# Patient Record
Sex: Male | Born: 1955 | Race: White | Hispanic: No | Marital: Married | Smoking: Current some day smoker
Health system: Southern US, Community
[De-identification: ages and names within clinical notes are randomized; demographics above are authoritative.]

## PROBLEM LIST (undated history)

## (undated) DIAGNOSIS — C61 Malignant neoplasm of prostate: Secondary | ICD-10-CM

## (undated) DIAGNOSIS — I509 Heart failure, unspecified: Secondary | ICD-10-CM

## (undated) DIAGNOSIS — D689 Coagulation defect, unspecified: Secondary | ICD-10-CM

## (undated) DIAGNOSIS — R519 Headache, unspecified: Secondary | ICD-10-CM

## (undated) DIAGNOSIS — I2510 Atherosclerotic heart disease of native coronary artery without angina pectoris: Secondary | ICD-10-CM

## (undated) DIAGNOSIS — R51 Headache: Secondary | ICD-10-CM

## (undated) HISTORY — DX: Atherosclerotic heart disease of native coronary artery without angina pectoris: I25.10

## (undated) HISTORY — DX: Heart failure, unspecified: I50.9

## (undated) HISTORY — PX: CERVICAL DISCECTOMY: SHX98

## (undated) HISTORY — PX: TONSILLECTOMY: SUR1361

## (undated) HISTORY — PX: PROSTATE BIOPSY: SHX241

## (undated) HISTORY — DX: Coagulation defect, unspecified: D68.9

---

## 1974-08-14 HISTORY — PX: CYST EXCISION: SHX5701

## 2006-12-19 LAB — HM COLONOSCOPY

## 2009-02-16 ENCOUNTER — Ambulatory Visit (HOSPITAL_COMMUNITY): Admission: RE | Admit: 2009-02-16 | Discharge: 2009-02-16 | Payer: Self-pay | Admitting: Family Medicine

## 2009-03-24 ENCOUNTER — Ambulatory Visit (HOSPITAL_COMMUNITY): Admission: RE | Admit: 2009-03-24 | Discharge: 2009-03-25 | Payer: Self-pay | Admitting: Neurosurgery

## 2009-05-11 ENCOUNTER — Encounter: Admission: RE | Admit: 2009-05-11 | Discharge: 2009-05-11 | Payer: Self-pay | Admitting: Neurosurgery

## 2010-11-19 LAB — DIFFERENTIAL
Basophils Relative: 0 % (ref 0–1)
Eosinophils Absolute: 0.3 10*3/uL (ref 0.0–0.7)
Eosinophils Relative: 2 % (ref 0–5)
Neutrophils Relative %: 66 % (ref 43–77)

## 2010-11-19 LAB — TYPE AND SCREEN
ABO/RH(D): AB POS
Antibody Screen: NEGATIVE

## 2010-11-19 LAB — CBC
HCT: 41 % (ref 39.0–52.0)
MCHC: 34.3 g/dL (ref 30.0–36.0)
MCV: 95.3 fL (ref 78.0–100.0)
Platelets: 298 10*3/uL (ref 150–400)
RDW: 13.5 % (ref 11.5–15.5)

## 2010-12-27 NOTE — Op Note (Signed)
[name], Cowan              ACCOUNT NO.:  [id number]   MEDICAL RECORD NO.:  [id number]          PATIENT TYPE:  INP   LOCATION:  3524                         FACILITY:  [facility]   PHYSICIAN:  Kathaleen Maser. Pool, M.D.    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   POSTOPERATIVE DIAGNOSIS:  C4-5, C5-6, and C6-7 spondylosis with stenosis  and radiculopathy.   PROCEDURE NOTE:  C4-5, C5-6, and C6-7 anterior cervical diskectomy and  fusion with allograft and plating.   SURGEON:  Kathaleen Maser. Pool, MD   ASSISTANT:  Donalee Citrin, MD   ANESTHESIA:  General endotracheal.   INDICATIONS:  Andre Cowan is a 55 year old male with history of neck pain  with right upper extremity pain, paresthesia, and weakness consistent  with a mixed cervical radiculopathy.  Workup demonstrates evidence of  marked spondylosis and multilevel stenosis worse at C4-5, C5-6, and C6-7  levels.  The patient has been counseled as to his options.  He decided  to proceed with 3-level anterior cervical decompression and fusion with  instrumentation.   OPERATIVE NOTE:  The patient was brought to the operating room and  placed in the supine position.  After adequate level of anesthesia was  achieved, the patient was positioned with the neck slightly extended and  held in place with halter traction.  The patient's anterior cervical  region was prepped and draped sterilely.  A 10 blade was used to make  curvilinear skin incision overlying the C5-6 level.  This was carried  down sharply to the platysma.  The platysma was then divided vertically  and dissection proceeded along the medial border of the  sternocleidomastoid muscle and carotid sheath.  Trachea and esophagus  were mobilized and tracked towards the left.  Prevertebral fascia was  stripped off  the anterior spinal column.  Longus colli muscle was then  elevated bilaterally using electrocautery.  Deep self-retaining  retractor was placed.  Intraoperative fluoroscopy was used and the C4-5,  C5-6, and T6-7 levels were confirmed.  The anterior osteophytes were  removed using high-speed drill and Leksell rongeurs at 3 levels.  Disk  space was then incised with 15 blade in a rectangular fashion.  Wide  space clean-out was achieved using pituitary rongeurs, forward and  backward Karlin curettes, Kerrison rongeurs, and high-speed drill.  All  elements of disk were removed down the level of the posterior annulus.  Microscope was brought into the field and used throughout the remainder  of the diskectomy starting first at C4-5.  The remaining aspects of  annulus and osteophytes were removed using high-speed drill down the  level of the posterior longitudinal ligament.  The posterior  longitudinal ligament was then elevated and resected in the piecemeal  fashion using Kerrison rongeurs.  The underlying thecal sac was then  identified.  Wide central decompression was then performed by  undercutting the bodies of C4 and C5.  Decompression was then proceeded  out to the edge of neural foramen.  Wide anterior foraminotomies were  then performed along the course of the exiting C5 nerve roots  bilaterally.  At this point, a very thorough decompression was achieved  at C4-5.  C5-6 and C6-7 were decompressed in a similar fashion again  without complication.  Wound was then irrigated with antibiotic  solution.  Gelfoam was placed topically for hemostasis and found to be  good.  Cornerstone allograft wedges were then packed into place at C4-5,  C5-6, and C6-7.  All 3 grafts were recessed approximately 1 mm at the  anterior cortical margin.  Atlantis translational plate was then placed  over the C4-C7 levels.  This was then attached under fluoroscopic  guidance using 30-mm fixed angle screws, 2 each  at all 4 levels.  Final  images revealed good position of bone grafts, hardware at proper  operative level with normal alignment of spine.  Wound was then  irrigated with antibiotic solution.  Gelfoam was placed topically for  hemostasis which was found to be good.  Wound was then closed in typical  fashion.  Steri-Strips and sterile dressing were applied.  There were no  complications.  The patient tolerated the procedure well and he returned  to the recovery room postoperatively.           ______________________________  Kathaleen Maser Pool, M.D.     HAP/MEDQ  D:  03/24/2009  T:  03/25/2009  Job:  [id number]

## 2017-11-19 ENCOUNTER — Other Ambulatory Visit: Payer: Self-pay | Admitting: Family Medicine

## 2017-11-19 DIAGNOSIS — N139 Obstructive and reflux uropathy, unspecified: Secondary | ICD-10-CM

## 2017-12-28 ENCOUNTER — Ambulatory Visit
Admission: RE | Admit: 2017-12-28 | Discharge: 2017-12-28 | Disposition: A | Discharge status: Home / Self Care | Payer: BLUE CROSS/BLUE SHIELD | Source: Ambulatory Visit | Attending: Family Medicine | Admitting: Family Medicine

## 2017-12-28 DIAGNOSIS — N139 Obstructive and reflux uropathy, unspecified: Secondary | ICD-10-CM

## 2018-01-11 ENCOUNTER — Encounter (HOSPITAL_COMMUNITY): Payer: Self-pay

## 2018-01-11 ENCOUNTER — Emergency Department (HOSPITAL_COMMUNITY)
Admission: EM | Admit: 2018-01-11 | Discharge: 2018-01-12 | Disposition: A | Discharge status: Home / Self Care | Payer: BLUE CROSS/BLUE SHIELD | Attending: Emergency Medicine | Admitting: Emergency Medicine

## 2018-01-11 ENCOUNTER — Other Ambulatory Visit: Payer: Self-pay

## 2018-01-11 DIAGNOSIS — R339 Retention of urine, unspecified: Secondary | ICD-10-CM | POA: Diagnosis not present

## 2018-01-11 LAB — URINALYSIS, ROUTINE W REFLEX MICROSCOPIC
BACTERIA UA: NONE SEEN
Bilirubin Urine: NEGATIVE
GLUCOSE, UA: NEGATIVE mg/dL
Ketones, ur: NEGATIVE mg/dL
Leukocytes, UA: NEGATIVE
Nitrite: NEGATIVE
Protein, ur: NEGATIVE mg/dL
Specific Gravity, Urine: 1.011 (ref 1.005–1.030)
pH: 6 (ref 5.0–8.0)

## 2018-01-11 LAB — I-STAT CHEM 8, ED
BUN: 11 mg/dL (ref 6–20)
CHLORIDE: 102 mmol/L (ref 101–111)
Calcium, Ion: 1.2 mmol/L (ref 1.15–1.40)
Creatinine, Ser: 1.2 mg/dL (ref 0.61–1.24)
GLUCOSE: 96 mg/dL (ref 65–99)
HEMATOCRIT: 44 % (ref 39.0–52.0)
Hemoglobin: 15 g/dL (ref 13.0–17.0)
POTASSIUM: 3.6 mmol/L (ref 3.5–5.1)
SODIUM: 138 mmol/L (ref 135–145)
TCO2: 24 mmol/L (ref 22–32)

## 2018-01-11 MED ORDER — LIDOCAINE HCL URETHRAL/MUCOSAL 2 % EX GEL
1.0000 "application " | Freq: Once | CUTANEOUS | Status: AC | PRN
  Administered 2018-01-11: 1 via URETHRAL
  Filled 2018-01-11 (×2): qty 5

## 2018-01-11 NOTE — ED Notes (Signed)
Urine culture sent down with UA. 

## 2018-01-11 NOTE — ED Provider Notes (Signed)
[facility] DEPT Provider Note   CSN: [id number] Arrival date & time: 01/11/18  2016     History   Chief Complaint Chief Complaint  Patient presents with  . Urinary Retention    HPI Andre Cowan is a 62 y.o. male.  The history is provided by the patient.  Illness  This is a new problem. The current episode started 6 to 12 hours ago. The problem occurs constantly. The problem has not changed since onset.Pertinent negatives include no chest pain, no abdominal pain, no headaches and no shortness of breath. Nothing aggravates the symptoms. Nothing relieves the symptoms. He has tried nothing for the symptoms. The treatment provided no relief.  Patient with difficulty urinating for 6 months presents with acute retention x 10 hours post biopsy.  Told to come to the ED by Dr. Alroy Dust of urology.    History reviewed. No pertinent past medical history.  There are no active problems to display for this patient.    Home Medications    Prior to Admission medications   Not on File    Family History History reviewed. No pertinent family history.  Social History Social History   Tobacco Use  . Smoking status: Not on file  Substance Use Topics  . Alcohol use: Not on file  . Drug use: Not on file     Allergies   Patient has no known allergies.   Review of Systems Review of Systems  Constitutional: Negative for fever.  Eyes: Negative for photophobia.  Respiratory: Negative for shortness of breath.   Cardiovascular: Negative for chest pain, palpitations and leg swelling.  Gastrointestinal: Negative for abdominal pain.  Genitourinary: Positive for difficulty urinating. Negative for flank pain.  Musculoskeletal: Negative for arthralgias.  Neurological: Negative for headaches.  Hematological: Negative for adenopathy.  All other systems reviewed and are negative.    Physical Exam Updated Vital Signs BP 120/88 (BP Location: Left Arm)    Pulse (!) 112   Temp 98.1 F (36.7 C) (Oral)   Resp 18   Ht 5\' 11"  (1.803 m)   Wt 89.4 kg (197 lb 1.6 oz)   SpO2 100%   BMI 27.49 kg/m   Physical Exam  Constitutional: He is oriented to person, place, and time. He appears well-developed and well-nourished. No distress.  HENT:  Head: Normocephalic and atraumatic.  Mouth/Throat: No oropharyngeal exudate.  Eyes: Pupils are equal, round, and reactive to light. Conjunctivae are normal.  Neck: Normal range of motion. Neck supple.  Cardiovascular: Normal rate, regular rhythm, normal heart sounds and intact distal pulses.  Pulmonary/Chest: Effort normal and breath sounds normal. No stridor. He has no wheezes. He has no rales.  Abdominal: Soft. Bowel sounds are normal. He exhibits no mass. There is no tenderness. There is no rebound and no guarding. No hernia.  Musculoskeletal: Normal range of motion.  Neurological: He is alert and oriented to person, place, and time.  Skin: Skin is warm and dry. Capillary refill takes less than 2 seconds.  Psychiatric: He has a normal mood and affect.     ED Treatments / Results  Labs (all labs ordered are listed, but only abnormal results are displayed) Results for orders placed or performed during the hospital encounter of 01/11/18  Urinalysis, Routine w reflex microscopic- may I&O cath if menses  Result Value Ref Range   Color, Urine YELLOW YELLOW   APPearance CLEAR CLEAR   Specific Gravity, Urine 1.011 1.005 - 1.030   pH 6.0 5.0 -  8.0   Glucose, UA NEGATIVE NEGATIVE mg/dL   Hgb urine dipstick LARGE (A) NEGATIVE   Bilirubin Urine NEGATIVE NEGATIVE   Ketones, ur NEGATIVE NEGATIVE mg/dL   Protein, ur NEGATIVE NEGATIVE mg/dL   Nitrite NEGATIVE NEGATIVE   Leukocytes, UA NEGATIVE NEGATIVE   RBC / HPF >50 (H) 0 - 5 RBC/hpf   WBC, UA 0-5 0 - 5 WBC/hpf   Bacteria, UA NONE SEEN NONE SEEN   Mucus PRESENT   I-stat chem 8, ed  Result Value Ref Range   Sodium 138 135 - 145 mmol/L   Potassium 3.6 3.5  - 5.1 mmol/L   Chloride 102 101 - 111 mmol/L   BUN 11 6 - 20 mg/dL   Creatinine, Ser 1.20 0.61 - 1.24 mg/dL   Glucose, Bld 96 65 - 99 mg/dL   Calcium, Ion 1.20 1.15 - 1.40 mmol/L   TCO2 24 22 - 32 mmol/L   Hemoglobin 15.0 13.0 - 17.0 g/dL   HCT 44.0 39.0 - 52.0 %   US Renal  Result Date: 12/29/2017 CLINICAL DATA:  Initial evaluation for possible urinary obstruction. EXAM: RENAL / URINARY TRACT ULTRASOUND COMPLETE COMPARISON:  None available. FINDINGS: Right Kidney: Length: 11.4 cm. Echogenicity within normal limits. Mild cortical thinning at both upper and lower poles noted. No mass or hydronephrosis visualized. 7 mm nonobstructive calculus noted within the interpolar region. Additional 8 mm nonobstructive calculus present at the lower pole. Left Kidney: Length: 11.2 cm. Echogenicity within normal limits. Mild diffuse cortical thinning noted. No mass or hydronephrosis visualized. 8 mm nonobstructive calculus present at the interpolar region. Bladder: Appears normal for degree of bladder distention. Prostate enlarged measuring 5.1 x 4.3 x 5.3 cm. Large postvoid bladder volume of 306 cc relative to prevoid volume of 375 cc noted. IMPRESSION: 1. Bilateral nonobstructive nephrolithiasis as above. No hydronephrosis. 2. Mild chronic renal cortical thinning bilaterally. 3. Enlarged prostate. Fairly sizable postvoid residual within the bladder lumen suggests associated outlet obstruction. Electronically Signed   By: Jeannine Boga M.D.   On: 12/29/2017 07:33   Radiology No results found.  Procedures Procedures (including critical care time)  Medications Ordered in ED Medications  lidocaine (XYLOCAINE) 2 % jelly 1 application (has no administration in time range)     Foley inserted by Karna Christmas  Final Clinical Impressions(s) / ED Diagnoses  Foley will need to be in place for 7 days and removed in the urologists office.  Patient and family immediately made aware of this.    Return for weakness,  numbness, changes in vision or speech, fevers >100.4 unrelieved by medication, shortness of breath, intractable vomiting, or diarrhea, abdominal pain, Inability to tolerate liquids or food, cough, altered mental status or any concerns. No signs of systemic illness or infection. The patient is nontoxic-appearing on exam and vital signs are within normal limits.   I have reviewed the triage vital signs and the nursing notes. Pertinent labs &imaging results that were available during my care of the patient were reviewed by me and considered in my medical decision making (see chart for details).  After history, exam, and medical workup I feel the patient has been appropriately medically screened and is safe for discharge home. Pertinent diagnoses were discussed with the patient. Patient was given return precautions.   Randal Buba Neysa Arts, MD   Randal Buba, Jamon Hayhurst, MD 01/11/18 2325

## 2018-01-11 NOTE — ED Triage Notes (Signed)
Pt reports urinary retention. He had a prostate biopsy yesterday. He denies pain at this time, but endorses pelvic pain when he has the urge to urinate and intense burning when actually urinating. A&ox4. N/V/D.

## 2018-01-15 ENCOUNTER — Other Ambulatory Visit: Payer: Self-pay | Admitting: Urology

## 2018-01-15 DIAGNOSIS — C61 Malignant neoplasm of prostate: Secondary | ICD-10-CM

## 2018-02-08 ENCOUNTER — Encounter (HOSPITAL_COMMUNITY)
Admission: RE | Admit: 2018-02-08 | Discharge: 2018-02-08 | Disposition: A | Discharge status: Home / Self Care | Payer: BLUE CROSS/BLUE SHIELD | Source: Ambulatory Visit | Attending: Urology | Admitting: Urology

## 2018-02-08 ENCOUNTER — Ambulatory Visit (HOSPITAL_COMMUNITY)
Admission: RE | Admit: 2018-02-08 | Discharge: 2018-02-08 | Disposition: A | Discharge status: Home / Self Care | Payer: BLUE CROSS/BLUE SHIELD | Source: Ambulatory Visit | Attending: Urology | Admitting: Urology

## 2018-02-08 DIAGNOSIS — C61 Malignant neoplasm of prostate: Secondary | ICD-10-CM | POA: Diagnosis present

## 2018-02-08 MED ORDER — TECHNETIUM TC 99M MEDRONATE IV KIT
21.8000 | PACK | Freq: Once | INTRAVENOUS | Status: AC
  Administered 2018-02-08: 21.8 via INTRAVENOUS

## 2018-02-08 MED ORDER — GADOBENATE DIMEGLUMINE 529 MG/ML IV SOLN
20.0000 mL | Freq: Once | INTRAVENOUS | Status: AC | PRN
  Administered 2018-02-08: 18 mL via INTRAVENOUS

## 2018-02-25 ENCOUNTER — Other Ambulatory Visit: Payer: Self-pay | Admitting: Urology

## 2018-03-28 NOTE — Patient Instructions (Addendum)
Andre Cowan  03/28/2018   Your procedure is scheduled on: 04-05-18   Report to [facility] Main  Entrance    Report to [place] at 5:30 AM    Call this number if you have problems the morning of surgery [phone]   Remember: Do not eat food or drink liquids :After Midnight.     Take these medicines the morning of surgery with A SIP OF WATER: None                                You may not have any metal on your body including hair pins and              piercings  Do not wear jewelry, lotions, powders, cologne or deodorant             Men may shave face and neck.   Do not bring valuables to the hospital. [place].  Contacts, dentures or bridgework may not be worn into surgery.  Leave suitcase in the car. After surgery it may be brought to your room.    Special Instructions: N/A              Please read over the following fact sheets you were given: _____________________________________________________________________          [facility] - Preparing for Surgery Before surgery, you can play an important role.  Because skin is not sterile, your skin needs to be as free of germs as possible.  You can reduce the number of germs on your skin by washing with CHG (chlorahexidine gluconate) soap before surgery.  CHG is an antiseptic cleaner which kills germs and bonds with the skin to continue killing germs even after washing. Please DO NOT use if you have an allergy to CHG or antibacterial soaps.  If your skin becomes reddened/irritated stop using the CHG and inform your nurse when you arrive at Short Stay. Do not shave (including legs and underarms) for at least 48 hours prior to the first CHG shower.  You may shave your face/neck. Please follow these instructions carefully:  1.  Shower with CHG Soap the night before surgery and the  morning of Surgery.  2.  If you choose to wash your hair, wash your hair  first as usual with your  normal  shampoo.  3.  After you shampoo, rinse your hair and body thoroughly to remove the  shampoo.                           4.  Use CHG as you would any other liquid soap.  You can apply chg directly  to the skin and wash                       Gently with a scrungie or clean washcloth.  5.  Apply the CHG Soap to your body ONLY FROM THE NECK DOWN.   Do not use on face/ open                           Wound or open sores. Avoid contact with eyes, ears mouth and  genitals (private parts).                       Wash face,  Genitals (private parts) with your normal soap.             6.  Wash thoroughly, paying special attention to the area where your surgery  will be performed.  7.  Thoroughly rinse your body with warm water from the neck down.  8.  DO NOT shower/wash with your normal soap after using and rinsing off  the CHG Soap.                9.  Pat yourself dry with a clean towel.            10.  Wear clean pajamas.            11.  Place clean sheets on your bed the night of your first shower and do not  sleep with pets. Day of Surgery : Do not apply any lotions/deodorants the morning of surgery.  Please wear clean clothes to the hospital/surgery center.  FAILURE TO FOLLOW THESE INSTRUCTIONS MAY RESULT IN THE CANCELLATION OF YOUR SURGERY PATIENT SIGNATURE_________________________________  NURSE SIGNATURE__________________________________  ________________________________________________________________________

## 2018-03-29 ENCOUNTER — Encounter (HOSPITAL_COMMUNITY)
Admission: RE | Admit: 2018-03-29 | Discharge: 2018-03-29 | Disposition: A | Discharge status: Home / Self Care | Payer: BLUE CROSS/BLUE SHIELD | Source: Ambulatory Visit | Attending: Urology | Admitting: Urology

## 2018-03-29 ENCOUNTER — Encounter (HOSPITAL_COMMUNITY): Payer: Self-pay

## 2018-03-29 ENCOUNTER — Other Ambulatory Visit: Payer: Self-pay

## 2018-03-29 DIAGNOSIS — F17210 Nicotine dependence, cigarettes, uncomplicated: Secondary | ICD-10-CM | POA: Insufficient documentation

## 2018-03-29 DIAGNOSIS — Z01812 Encounter for preprocedural laboratory examination: Secondary | ICD-10-CM | POA: Insufficient documentation

## 2018-03-29 DIAGNOSIS — C61 Malignant neoplasm of prostate: Secondary | ICD-10-CM | POA: Insufficient documentation

## 2018-03-29 HISTORY — DX: Headache, unspecified: R51.9

## 2018-03-29 HISTORY — DX: Headache: R51

## 2018-03-29 LAB — BASIC METABOLIC PANEL
ANION GAP: 8 (ref 5–15)
BUN: 20 mg/dL (ref 8–23)
CALCIUM: 9.6 mg/dL (ref 8.9–10.3)
CHLORIDE: 106 mmol/L (ref 98–111)
CO2: 29 mmol/L (ref 22–32)
Creatinine, Ser: 0.94 mg/dL (ref 0.61–1.24)
GFR calc Af Amer: >60 mL/min (ref >60)
GFR calc non Af Amer: >60 mL/min (ref >60)
GLUCOSE: 84 mg/dL (ref 70–99)
POTASSIUM: 4.3 mmol/L (ref 3.5–5.1)
Sodium: 143 mmol/L (ref 135–145)

## 2018-03-29 LAB — CBC
HEMATOCRIT: 48.2 % (ref 39.0–52.0)
HEMOGLOBIN: 16.1 g/dL (ref 13.0–17.0)
MCH: 31.4 pg (ref 26.0–34.0)
MCHC: 33.4 g/dL (ref 30.0–36.0)
MCV: 94.1 fL (ref 78.0–100.0)
Platelets: 293 10*3/uL (ref 150–400)
RBC: 5.12 MIL/uL (ref 4.22–5.81)
RDW: 12.9 % (ref 11.5–15.5)
WBC: 8 10*3/uL (ref 4.0–10.5)

## 2018-04-05 ENCOUNTER — Observation Stay (HOSPITAL_COMMUNITY)
Admission: RE | Admit: 2018-04-05 | Discharge: 2018-04-06 | Disposition: A | Discharge status: Home / Self Care | Payer: BLUE CROSS/BLUE SHIELD | Source: Ambulatory Visit | Attending: Urology | Admitting: Urology

## 2018-04-05 ENCOUNTER — Ambulatory Visit (HOSPITAL_COMMUNITY): Payer: BLUE CROSS/BLUE SHIELD | Admitting: Anesthesiology

## 2018-04-05 ENCOUNTER — Encounter (HOSPITAL_COMMUNITY): Payer: Self-pay | Admitting: *Deleted

## 2018-04-05 ENCOUNTER — Other Ambulatory Visit: Payer: Self-pay

## 2018-04-05 ENCOUNTER — Encounter (HOSPITAL_COMMUNITY)
Admission: RE | Disposition: A | Discharge status: Home / Self Care | Payer: Self-pay | Source: Ambulatory Visit | Attending: Urology

## 2018-04-05 DIAGNOSIS — Z87891 Personal history of nicotine dependence: Secondary | ICD-10-CM | POA: Insufficient documentation

## 2018-04-05 DIAGNOSIS — C775 Secondary and unspecified malignant neoplasm of intrapelvic lymph nodes: Secondary | ICD-10-CM | POA: Diagnosis not present

## 2018-04-05 DIAGNOSIS — C61 Malignant neoplasm of prostate: Secondary | ICD-10-CM | POA: Diagnosis not present

## 2018-04-05 HISTORY — PX: ROBOT ASSISTED LAPAROSCOPIC RADICAL PROSTATECTOMY: SHX5141

## 2018-04-05 HISTORY — PX: LYMPHADENECTOMY: SHX5960

## 2018-04-05 LAB — HEMOGLOBIN AND HEMATOCRIT, BLOOD
HEMATOCRIT: 46.3 % (ref 39.0–52.0)
Hemoglobin: 15 g/dL (ref 13.0–17.0)

## 2018-04-05 LAB — TYPE AND SCREEN
ABO/RH(D): AB POS
Antibody Screen: NEGATIVE

## 2018-04-05 LAB — ABO/RH: ABO/RH(D): AB POS

## 2018-04-05 SURGERY — PROSTATECTOMY, RADICAL, ROBOT-ASSISTED, LAPAROSCOPIC
Anesthesia: General

## 2018-04-05 MED ORDER — ONDANSETRON HCL 4 MG/2ML IJ SOLN: 4.0000 mg | INTRAMUSCULAR | Status: DC | PRN

## 2018-04-05 MED ORDER — PHENYLEPHRINE 40 MCG/ML (10ML) SYRINGE FOR IV PUSH (FOR BLOOD PRESSURE SUPPORT)
PREFILLED_SYRINGE | INTRAVENOUS | Status: AC
  Filled 2018-04-05: qty 10

## 2018-04-05 MED ORDER — KETOROLAC TROMETHAMINE 30 MG/ML IJ SOLN
INTRAMUSCULAR | Status: AC
  Filled 2018-04-05: qty 1

## 2018-04-05 MED ORDER — MEPERIDINE HCL 50 MG/ML IJ SOLN: 6.2500 mg | INTRAMUSCULAR | Status: DC | PRN

## 2018-04-05 MED ORDER — ROCURONIUM BROMIDE 100 MG/10ML IV SOLN
INTRAVENOUS | Status: AC
  Filled 2018-04-05: qty 1

## 2018-04-05 MED ORDER — OXYCODONE HCL 5 MG PO TABS: 5.0000 mg | ORAL_TABLET | ORAL | Status: DC | PRN

## 2018-04-05 MED ORDER — HYDROMORPHONE HCL 1 MG/ML IJ SOLN
INTRAMUSCULAR | Status: DC | PRN
  Administered 2018-04-05 (×2): 0.5 mg via INTRAVENOUS

## 2018-04-05 MED ORDER — SUGAMMADEX SODIUM 200 MG/2ML IV SOLN
INTRAVENOUS | Status: DC | PRN
  Administered 2018-04-05: 180 mg via INTRAVENOUS

## 2018-04-05 MED ORDER — EPHEDRINE SULFATE-NACL 50-0.9 MG/10ML-% IV SOSY
PREFILLED_SYRINGE | INTRAVENOUS | Status: DC | PRN
  Administered 2018-04-05: 5 mg via INTRAVENOUS

## 2018-04-05 MED ORDER — ACETAMINOPHEN 500 MG PO TABS
1000.0000 mg | ORAL_TABLET | Freq: Four times a day (QID) | ORAL | Status: AC
  Administered 2018-04-05 – 2018-04-06 (×4): 1000 mg via ORAL
  Filled 2018-04-05 (×4): qty 2

## 2018-04-05 MED ORDER — PROPOFOL 10 MG/ML IV BOLUS
INTRAVENOUS | Status: AC
  Filled 2018-04-05: qty 20

## 2018-04-05 MED ORDER — SODIUM CHLORIDE 0.9 % IJ SOLN
INTRAMUSCULAR | Status: DC | PRN
  Administered 2018-04-05: 20 mL

## 2018-04-05 MED ORDER — DEXAMETHASONE SODIUM PHOSPHATE 10 MG/ML IJ SOLN
INTRAMUSCULAR | Status: AC
  Filled 2018-04-05: qty 1

## 2018-04-05 MED ORDER — LACTATED RINGERS IV SOLN
INTRAVENOUS | Status: DC
  Administered 2018-04-05 (×3): via INTRAVENOUS

## 2018-04-05 MED ORDER — SODIUM CHLORIDE 0.9 % IV BOLUS
1000.0000 mL | Freq: Once | INTRAVENOUS | Status: AC
  Administered 2018-04-05: 1000 mL via INTRAVENOUS

## 2018-04-05 MED ORDER — FENTANYL CITRATE (PF) 100 MCG/2ML IJ SOLN
25.0000 ug | INTRAMUSCULAR | Status: DC | PRN
  Administered 2018-04-05 (×3): 50 ug via INTRAVENOUS

## 2018-04-05 MED ORDER — ACETAMINOPHEN 10 MG/ML IV SOLN
INTRAVENOUS | Status: AC
  Filled 2018-04-05: qty 100

## 2018-04-05 MED ORDER — HYDROMORPHONE HCL 2 MG/ML IJ SOLN
INTRAMUSCULAR | Status: AC
  Filled 2018-04-05: qty 1

## 2018-04-05 MED ORDER — LIDOCAINE 2% (20 MG/ML) 5 ML SYRINGE
INTRAMUSCULAR | Status: DC | PRN
  Administered 2018-04-05: 50 mg via INTRAVENOUS

## 2018-04-05 MED ORDER — ACETAMINOPHEN 10 MG/ML IV SOLN
1000.0000 mg | Freq: Once | INTRAVENOUS | Status: AC
  Administered 2018-04-05: 1000 mg via INTRAVENOUS

## 2018-04-05 MED ORDER — STERILE WATER FOR IRRIGATION IR SOLN
Status: DC | PRN
  Administered 2018-04-05: 1000 mL

## 2018-04-05 MED ORDER — FENTANYL CITRATE (PF) 250 MCG/5ML IJ SOLN
INTRAMUSCULAR | Status: AC
  Filled 2018-04-05: qty 5

## 2018-04-05 MED ORDER — MIDAZOLAM HCL 5 MG/5ML IJ SOLN
INTRAMUSCULAR | Status: DC | PRN
  Administered 2018-04-05: 2 mg via INTRAVENOUS

## 2018-04-05 MED ORDER — CEFAZOLIN SODIUM-DEXTROSE 2-4 GM/100ML-% IV SOLN
INTRAVENOUS | Status: AC
  Filled 2018-04-05: qty 100

## 2018-04-05 MED ORDER — ONDANSETRON HCL 4 MG/2ML IJ SOLN
INTRAMUSCULAR | Status: AC
  Filled 2018-04-05: qty 2

## 2018-04-05 MED ORDER — ONDANSETRON HCL 4 MG/2ML IJ SOLN
INTRAMUSCULAR | Status: DC | PRN
  Administered 2018-04-05: 4 mg via INTRAVENOUS

## 2018-04-05 MED ORDER — ROCURONIUM BROMIDE 10 MG/ML (PF) SYRINGE
PREFILLED_SYRINGE | INTRAVENOUS | Status: DC | PRN
  Administered 2018-04-05: 100 mg via INTRAVENOUS
  Administered 2018-04-05: 20 mg via INTRAVENOUS

## 2018-04-05 MED ORDER — METOCLOPRAMIDE HCL 5 MG/ML IJ SOLN: 10.0000 mg | Freq: Once | INTRAMUSCULAR | Status: DC | PRN

## 2018-04-05 MED ORDER — SULFAMETHOXAZOLE-TRIMETHOPRIM 800-160 MG PO TABS: 1.0000 | ORAL_TABLET | Freq: Two times a day (BID) | ORAL | 0 refills | Status: DC

## 2018-04-05 MED ORDER — DEXAMETHASONE SODIUM PHOSPHATE 10 MG/ML IJ SOLN
INTRAMUSCULAR | Status: DC | PRN
  Administered 2018-04-05: 10 mg via INTRAVENOUS

## 2018-04-05 MED ORDER — PHENYLEPHRINE 40 MCG/ML (10ML) SYRINGE FOR IV PUSH (FOR BLOOD PRESSURE SUPPORT)
PREFILLED_SYRINGE | INTRAVENOUS | Status: DC | PRN
  Administered 2018-04-05: 40 ug via INTRAVENOUS
  Administered 2018-04-05: 80 ug via INTRAVENOUS

## 2018-04-05 MED ORDER — DIPHENHYDRAMINE HCL 12.5 MG/5ML PO ELIX: 12.5000 mg | ORAL_SOLUTION | Freq: Four times a day (QID) | ORAL | Status: DC | PRN

## 2018-04-05 MED ORDER — SODIUM CHLORIDE 0.9 % IJ SOLN
INTRAMUSCULAR | Status: AC
  Filled 2018-04-05: qty 20

## 2018-04-05 MED ORDER — HYDROCODONE-ACETAMINOPHEN 5-325 MG PO TABS: 1.0000 | ORAL_TABLET | Freq: Four times a day (QID) | ORAL | 0 refills | Status: DC | PRN

## 2018-04-05 MED ORDER — HYDROMORPHONE HCL 1 MG/ML IJ SOLN
0.5000 mg | INTRAMUSCULAR | Status: DC | PRN
  Administered 2018-04-05: 0.5 mg via INTRAVENOUS
  Filled 2018-04-05: qty 1

## 2018-04-05 MED ORDER — DEXTROSE-NACL 5-0.45 % IV SOLN
INTRAVENOUS | Status: DC
  Administered 2018-04-05 (×2): via INTRAVENOUS

## 2018-04-05 MED ORDER — FENTANYL CITRATE (PF) 100 MCG/2ML IJ SOLN
INTRAMUSCULAR | Status: AC
  Filled 2018-04-05: qty 4

## 2018-04-05 MED ORDER — PROPOFOL 10 MG/ML IV BOLUS
INTRAVENOUS | Status: DC | PRN
  Administered 2018-04-05: 170 mg via INTRAVENOUS

## 2018-04-05 MED ORDER — DIPHENHYDRAMINE HCL 50 MG/ML IJ SOLN: 12.5000 mg | Freq: Four times a day (QID) | INTRAMUSCULAR | Status: DC | PRN

## 2018-04-05 MED ORDER — KETOROLAC TROMETHAMINE 30 MG/ML IJ SOLN
30.0000 mg | Freq: Once | INTRAMUSCULAR | Status: AC
  Administered 2018-04-05: 30 mg via INTRAVENOUS

## 2018-04-05 MED ORDER — FENTANYL CITRATE (PF) 100 MCG/2ML IJ SOLN
INTRAMUSCULAR | Status: DC | PRN
  Administered 2018-04-05: 100 ug via INTRAVENOUS
  Administered 2018-04-05 (×3): 50 ug via INTRAVENOUS

## 2018-04-05 MED ORDER — BUPIVACAINE LIPOSOME 1.3 % IJ SUSP
20.0000 mL | Freq: Once | INTRAMUSCULAR | Status: AC
  Administered 2018-04-05: 20 mL
  Filled 2018-04-05: qty 20

## 2018-04-05 MED ORDER — LACTATED RINGERS IR SOLN
Status: DC | PRN
  Administered 2018-04-05: 1000 mL

## 2018-04-05 MED ORDER — LIDOCAINE 2% (20 MG/ML) 5 ML SYRINGE
INTRAMUSCULAR | Status: AC
  Filled 2018-04-05: qty 5

## 2018-04-05 MED ORDER — LACTATED RINGERS IV SOLN: INTRAVENOUS | Status: DC

## 2018-04-05 MED ORDER — MIDAZOLAM HCL 2 MG/2ML IJ SOLN
INTRAMUSCULAR | Status: AC
  Filled 2018-04-05: qty 2

## 2018-04-05 MED ORDER — EPHEDRINE 5 MG/ML INJ
INTRAVENOUS | Status: AC
  Filled 2018-04-05: qty 10

## 2018-04-05 SURGICAL SUPPLY — 66 items
ADH SKN CLS APL DERMABOND .7 (GAUZE/BANDAGES/DRESSINGS) ×2
APL SWBSTK 6 STRL LF DISP (MISCELLANEOUS) ×2
APPLICATOR COTTON TIP 6 STRL (MISCELLANEOUS) ×2 IMPLANT
APPLICATOR COTTON TIP 6IN STRL (MISCELLANEOUS) ×4
CATH FOLEY 2WAY SLVR 18FR 30CC (CATHETERS) ×4 IMPLANT
CATH TIEMANN FOLEY 18FR 5CC (CATHETERS) ×4 IMPLANT
CHLORAPREP W/TINT 26ML (MISCELLANEOUS) ×4 IMPLANT
CLIP VESOLOCK LG 6/CT PURPLE (CLIP) ×8 IMPLANT
CLOTH BEACON ORANGE TIMEOUT ST (SAFETY) ×4 IMPLANT
CONT SPEC 4OZ CLIKSEAL STRL BL (MISCELLANEOUS) ×4 IMPLANT
COVER TIP SHEARS 8 DVNC (MISCELLANEOUS) ×2 IMPLANT
COVER TIP SHEARS 8MM DA VINCI (MISCELLANEOUS) ×2
CUTTER ECHEON FLEX ENDO 45 340 (ENDOMECHANICALS) ×4 IMPLANT
DECANTER SPIKE VIAL GLASS SM (MISCELLANEOUS) ×4 IMPLANT
DERMABOND ADVANCED (GAUZE/BANDAGES/DRESSINGS) ×2
DERMABOND ADVANCED .7 DNX12 (GAUZE/BANDAGES/DRESSINGS) ×2 IMPLANT
DRAPE ARM DVNC X/XI (DISPOSABLE) ×8 IMPLANT
DRAPE COLUMN DVNC XI (DISPOSABLE) ×2 IMPLANT
DRAPE DA VINCI XI ARM (DISPOSABLE) ×8
DRAPE DA VINCI XI COLUMN (DISPOSABLE) ×2
DRAPE SURG IRRIG POUCH 19X23 (DRAPES) ×4 IMPLANT
DRSG TEGADERM 4X4.75 (GAUZE/BANDAGES/DRESSINGS) ×4 IMPLANT
ELECT PENCIL ROCKER SW 15FT (MISCELLANEOUS) ×4 IMPLANT
ELECT REM PT RETURN 15FT ADLT (MISCELLANEOUS) ×4 IMPLANT
GAUZE SPONGE 2X2 8PLY STRL LF (GAUZE/BANDAGES/DRESSINGS) IMPLANT
GLOVE BIO SURGEON STRL SZ 6.5 (GLOVE) ×3 IMPLANT
GLOVE BIO SURGEONS STRL SZ 6.5 (GLOVE) ×1
GLOVE BIOGEL M STRL SZ7.5 (GLOVE) ×8 IMPLANT
GLOVE BIOGEL PI IND STRL 7.5 (GLOVE) ×2 IMPLANT
GLOVE BIOGEL PI INDICATOR 7.5 (GLOVE) ×2
GOWN STRL REUS W/TWL LRG LVL3 (GOWN DISPOSABLE) ×12 IMPLANT
HOLDER FOLEY CATH W/STRAP (MISCELLANEOUS) ×4 IMPLANT
IRRIG SUCT STRYKERFLOW 2 WTIP (MISCELLANEOUS) ×4
IRRIGATION SUCT STRKRFLW 2 WTP (MISCELLANEOUS) ×2 IMPLANT
IV LACTATED RINGERS 1000ML (IV SOLUTION) ×4 IMPLANT
KIT PROCEDURE DA VINCI SI (MISCELLANEOUS) ×2
KIT PROCEDURE DVNC SI (MISCELLANEOUS) ×2 IMPLANT
NDL INSUFFLATION 14GA 120MM (NEEDLE) ×2 IMPLANT
NDL SPNL 22GX7 QUINCKE BK (NEEDLE) ×2 IMPLANT
NEEDLE INSUFFLATION 14GA 120MM (NEEDLE) ×4 IMPLANT
NEEDLE SPNL 22GX7 QUINCKE BK (NEEDLE) ×4 IMPLANT
PACK ROBOT UROLOGY CUSTOM (CUSTOM PROCEDURE TRAY) ×4 IMPLANT
PAD POSITIONING PINK XL (MISCELLANEOUS) ×4 IMPLANT
PORT ACCESS TROCAR AIRSEAL 12 (TROCAR) ×2 IMPLANT
PORT ACCESS TROCAR AIRSEAL 5M (TROCAR) ×2
RELOAD STAPLE 45 4.1 GRN THCK (STAPLE) ×2 IMPLANT
SEAL CANN UNIV 5-8 DVNC XI (MISCELLANEOUS) ×8 IMPLANT
SEAL XI 5MM-8MM UNIVERSAL (MISCELLANEOUS) ×8
SET TRI-LUMEN FLTR TB AIRSEAL (TUBING) ×4 IMPLANT
SOLUTION ELECTROLUBE (MISCELLANEOUS) ×4 IMPLANT
SPONGE GAUZE 2X2 STER 10/PKG (GAUZE/BANDAGES/DRESSINGS)
SPONGE LAP 4X18 RFD (DISPOSABLE) ×4 IMPLANT
STAPLE RELOAD 45 GRN (STAPLE) ×2 IMPLANT
STAPLE RELOAD 45MM GREEN (STAPLE) ×4
SUT ETHILON 3 0 PS 1 (SUTURE) ×4 IMPLANT
SUT MNCRL AB 4-0 PS2 18 (SUTURE) ×8 IMPLANT
SUT PDS AB 1 CT1 27 (SUTURE) ×8 IMPLANT
SUT VIC AB 2-0 SH 27 (SUTURE) ×4
SUT VIC AB 2-0 SH 27X BRD (SUTURE) ×2 IMPLANT
SUT VIC AB 2-0 UR6 27 (SUTURE) ×4 IMPLANT
SUT VICRYL 0 UR6 27IN ABS (SUTURE) ×4 IMPLANT
SUT VLOC BARB 180 ABS3/0GR12 (SUTURE) ×12
SUTURE VLOC BRB 180 ABS3/0GR12 (SUTURE) ×6 IMPLANT
SYR 27GX1/2 1ML LL SAFETY (SYRINGE) ×4 IMPLANT
TOWEL OR NON WOVEN STRL DISP B (DISPOSABLE) ×4 IMPLANT
WATER STERILE IRR 1000ML POUR (IV SOLUTION) ×4 IMPLANT

## 2018-04-05 NOTE — H&P (Signed)
Andre Cowan is an 62 y.o. male.    Chief Complaint: Pre-OP Prostatectomy  HPI:   1 - High Risk Prostate Cancer - 11/12 cores up to 95% Gleason 4+5=9 disease by BX 12/2017. Exam pT3 with Rt side severe induration. TRUS 58mL, no median lobe, but some intravesical lateral lobe protrusion. Pelvic MRI / Bone Scan clinically localized and no overt sidewall invasion.   2 - Lower Urinary Tract Symptoms / Urinary Retention - slowly progressive irritating and obstructive symptoms x few years. Placed on tamsulosin by PCP 11/2017 and immediate improvement, but still some bother. DRE 11/2017 40gm. PVR 11/2017 "18mL" (modest elevation). Developed transient urinary retention after prostate biopsy 12/2017. FU PVRs still elevated in 313mL range.   PMH sig for 40PYsmoker/still smokes, C spine surgery (no deficits). NO ischemic CV disesae / blood thinners. He works in [employer] and travels most weekdays. His PCP is Terrill Mohr MD with [facility] in [place].   Today "Andre Cowan" is seen to proceed with prostatectomy.   Past Medical History:  Diagnosis Date  . Headache    Migraines    Past Surgical History:  Procedure Laterality Date  . CERVICAL DISCECTOMY    . CYST EXCISION  1976   Spine  . TONSILLECTOMY      History reviewed. No pertinent family history. Social History:  reports that he quit smoking about 2 years ago. He has never used smokeless tobacco. He reports that he does not drink alcohol or use drugs.  Allergies: No Known Allergies  Medications Prior to Admission  Medication Sig Dispense Refill  . acetaminophen (TYLENOL) 325 MG tablet Take 650 mg by mouth every 6 (six) hours as needed for moderate pain or headache.    . tamsulosin (FLOMAX) 0.4 MG CAPS capsule Take 0.4 mg by mouth 2 (two) times daily.  2    No results found for this or any previous visit (from the past 48 hour(s)). No results found.  Review of Systems  Constitutional: Negative.  Negative for chills and  fever.  HENT: Negative.   Eyes: Negative.   Respiratory: Negative.   Cardiovascular: Negative.   Gastrointestinal: Negative.   Genitourinary: Negative.   Musculoskeletal: Negative.   Skin: Negative.   Neurological: Negative.   Endo/Heme/Allergies: Negative.   Psychiatric/Behavioral: Negative.     Blood pressure 125/90, pulse 95, temperature 98 F (36.7 C), temperature source Oral, resp. rate 18, height 5\' 11"  (1.803 m), weight 89.5 kg, SpO2 98 %. Physical Exam  Constitutional: He appears well-developed.  HENT:  Head: Normocephalic.  Eyes: Pupils are equal, round, and reactive to light.  Neck: Normal range of motion.  Cardiovascular: Normal rate.  GI: Soft.  Genitourinary:  Genitourinary Comments: No CVAT  Musculoskeletal: Normal range of motion.  Neurological: He is alert.  Skin: Skin is warm.  Psychiatric: He has a normal mood and affect.     Assessment/Plan  Proceed as planned with prostatectomy with node dissection. Risks, benefits, alternatives, expected peri-op course discussed previously and reiterated today.   Alexis Frock, MD 04/05/2018, 7:10 AM

## 2018-04-05 NOTE — Anesthesia Preprocedure Evaluation (Signed)
Anesthesia Evaluation  Patient identified by MRN, date of birth, ID band Patient awake    Reviewed: Allergy & Precautions, NPO status , Patient's Chart, lab work & pertinent test results  Airway Mallampati: II  TM Distance: >3 FB Neck ROM: Full    Dental no notable dental hx.    Pulmonary neg pulmonary ROS, former smoker,    Pulmonary exam normal breath sounds clear to auscultation       Cardiovascular negative cardio ROS Normal cardiovascular exam Rhythm:Regular Rate:Normal     Neuro/Psych negative neurological ROS  negative psych ROS   GI/Hepatic negative GI ROS, Neg liver ROS,   Endo/Other  negative endocrine ROS  Renal/GU negative Renal ROS  negative genitourinary   Musculoskeletal negative musculoskeletal ROS (+)   Abdominal   Peds negative pediatric ROS (+)  Hematology negative hematology ROS (+)   Anesthesia Other Findings   Reproductive/Obstetrics negative OB ROS                             Anesthesia Physical Anesthesia Plan  ASA: I  Anesthesia Plan: General   Post-op Pain Management:    Induction: Intravenous  PONV Risk Score and Plan: 2 and Ondansetron and Treatment may vary due to age or medical condition  Airway Management Planned: Oral ETT  Additional Equipment:   Intra-op Plan:   Post-operative Plan: Extubation in OR  Informed Consent: I have reviewed the patients History and Physical, chart, labs and discussed the procedure including the risks, benefits and alternatives for the proposed anesthesia with the patient or authorized representative who has indicated his/her understanding and acceptance.   Dental advisory given  Plan Discussed with: CRNA  Anesthesia Plan Comments:         Anesthesia Quick Evaluation

## 2018-04-05 NOTE — Transfer of Care (Signed)
Immediate Anesthesia Transfer of Care Note  Patient: Andre Cowan  Procedure(s) Performed: Procedure(s): XI ROBOTIC ASSISTED LAPAROSCOPIC RADICAL PROSTATECTOMY (N/A) LYMPHADENECTOMY (Bilateral)  Patient Location: PACU  Anesthesia Type:General  Level of Consciousness:  sedated, patient cooperative and responds to stimulation  Airway & Oxygen Therapy:Patient Spontanous Breathing and Patient connected to face mask oxgen  Post-op Assessment:  Report given to PACU RN and Post -op Vital signs reviewed and stable  Post vital signs:  Reviewed and stable  Last Vitals:  Vitals:   04/05/18 0539  BP: 125/90  Pulse: 95  Resp: 18  Temp: 36.7 C  SpO2: 53%    Complications: No apparent anesthesia complications

## 2018-04-05 NOTE — Discharge Summary (Signed)
[facility] Discharge Summary  Admit date: 04/05/2018  Discharge date: 04/06/2018  Discharge to: Home  Discharge Service: Urology  Discharge Attending Physician:  Dr. Toy Care, MD  Discharge  Diagnoses: Prostate cancer  Secondary Diagnosis: Active Problems:   Prostate cancer [facility])   OR Procedures: Procedure(s): XI ROBOTIC ASSISTED LAPAROSCOPIC RADICAL PROSTATECTOMY LYMPHADENECTOMY 04/05/2018   Ancillary Procedures: None   Discharge Day Services: The patient was seen and examined by the Urology team prior to lunch service. Vital signs and laboratory values were stable and within normal limits.  The physical exam was benign and all surgical wounds were examined.  Discharge instructions were explained and all questions answered.  Subjective  No acute events overnight. Pain Controlled. No fever or chills.  Objective Patient Vitals for the past 8 hrs:  BP Temp Temp src Pulse Resp SpO2 Height Weight  04/05/18 1100 (!) 145/96 - - 82 10 99 % - -  04/05/18 1045 (!) 153/101 - - 79 12 100 % - -  04/05/18 1039 (!) 140/91 97.6 F (36.4 C) - 92 17 95 % - -  04/05/18 0551 - - - - - - 5\' 11"  (1.803 m) 89.5 kg  04/05/18 0539 125/90 98 F (36.7 C) Oral 95 18 98 % - -   Total I/O In: 2300 [I.V.:2300] Out: 100 [Blood:100]  General Appearance:        No acute distress Lungs:                       Normal work of breathing on room air Heart:                                Regular rate and rhythm Abdomen:                         Soft, non-tender, non-distended, incisions c/d/i Extremities:                      Warm and well perfused, no calf pain or swelling   Hospital Course:  The patient underwent RALP on 04/05/2018.  The patient tolerated the procedure well, was extubated in the OR, and afterwards was taken to the PACU for routine post-surgical care. When stable the patient was transferred to the floor.   The patient did well postoperatively.  The patient's diet was slowly  advanced and at the time of discharge was tolerating a regular diet.  The patient was discharged home on POD#1, at which point was tolerating a regular solid diet, having adequate pain control with P.O. pain medication, and could ambulate without difficulty. The patient will follow up with [place] for post op check.   Condition at Discharge: Improved  Discharge Medications:  Allergies as of 04/05/2018   No Known Allergies     Medication List    STOP taking these medications   tamsulosin 0.4 MG Caps capsule Commonly known as:  FLOMAX     TAKE these medications   acetaminophen 325 MG tablet Commonly known as:  TYLENOL Take 650 mg by mouth every 6 (six) hours as needed for moderate pain or headache.   HYDROcodone-acetaminophen 5-325 MG tablet Commonly known as:  NORCO/VICODIN Take 1-2 tablets by mouth every 6 (six) hours as needed for moderate pain or severe pain.   sulfamethoxazole-trimethoprim 800-160 MG tablet Commonly known as:  BACTRIM DS,SEPTRA DS Take 1 tablet  by mouth 2 (two) times daily. Start the day prior to foley removal appointment

## 2018-04-05 NOTE — Anesthesia Postprocedure Evaluation (Signed)
Anesthesia Post Note  Patient: Andre Cowan  Procedure(s) Performed: XI ROBOTIC ASSISTED LAPAROSCOPIC RADICAL PROSTATECTOMY (N/A ) LYMPHADENECTOMY (Bilateral )     Patient location during evaluation: PACU Anesthesia Type: General Level of consciousness: awake and alert Pain management: pain level controlled Vital Signs Assessment: post-procedure vital signs reviewed and stable Respiratory status: spontaneous breathing, nonlabored ventilation, respiratory function stable and patient connected to nasal cannula oxygen Cardiovascular status: blood pressure returned to baseline and stable Postop Assessment: no apparent nausea or vomiting Anesthetic complications: no    Last Vitals:  Vitals:   04/05/18 1145 04/05/18 1206  BP: (!) 145/92 (!) 129/96  Pulse: 87 81  Resp: 14 14  Temp:  36.5 C  SpO2: 93% 99%    Last Pain:  Vitals:   04/05/18 1214  TempSrc:   PainSc: 8                  Montez Hageman

## 2018-04-05 NOTE — Discharge Instructions (Signed)

## 2018-04-05 NOTE — Op Note (Signed)
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  PROCEDURES: 1.  Robotic-assisted laparoscopic radical prostatectomy. 2.  Bilateral pelvic lymphadenectomy. 3.  Injection of indocyanine green dye for sentinel lymph angiography.  ASSISTANT:  Debbrah Alar, PA  ESTIMATED BLOOD LOSS:  100 mL.  COMPLICATIONS:  None.  SPECIMENS: 1.  Periprostatic fat. 2.  Right external iliac lymph nodes. 3.  Right obturator lymph nodes. 4.  Right perivesical lymph nodes, sentinel. 5.  Left external iliac lymph nodes. 6.  Left obturator lymph nodes. 7.  Left perivesical lymph nodes, sentinel. 8.  Radical prostatectomy.  FINDINGS: 1.  Small sentinel lymphatic channels with small lymph nodes in a perivesical location bilaterally. 2.  Very adherent prostate tissue to the pelvic sidewall at the right apex, highly concerning for locally advanced disease. 3.  Right side bladder neck reconstruction performed.  INDICATIONS:  The patient is a very pleasant 62 year old gentleman who was found on workup of elevated PSA to have a very large volume high-risk adenocarcinoma of the prostate.  He also has significant obstructive voiding symptoms at baseline.  Options  were discussed for management including a primary therapy with ablative therapies versus surgical extirpation and he wished to proceed with prostatectomy.  He has a very good understanding of this.  He very well may require multimodal approach for  management.  Informed consent was obtained and placed in medical record.  DESCRIPTION OF PROCEDURE:  The patient was identified.  Radical prostatectomy was confirmed.  Procedure timeout was performed.  Intravenous access administered.  General endotracheal anesthesia induced.  The  patient was placed into a low lithotomy  position.  A sterile field was created prepped and draped his penis, perineum and proximal thighs using iodine and his infra-xiphoid abdomen using chlorhexidine gluconate.  His arms were tucked to the side with gel rolls.  He was further fastened to  operative table using 3-inch tape over foam padding across the supraxiphoid chest.  A test of steep Trendelenburg positioning was performed and found to be suitably positioned.  Foley catheter was placed free to straight drain.  Next a high-flow,  low-pressure pneumoperitoneum was obtained using Veress technique in the infraumbilical midline and passed the aspiration and drop test.  An 8 mm robotic camera port was then placed in the same location.  Distal ports in place as follows:  Right  paramedian 8 mm robotic port, right far lateral 12 mm assistant port, left paramedian 8 mm robotic port, left far lateral 8 mm robotic port, right paramedian 5 mm suction port.  Robot was docked and passed the electronic checks.  Initial attention was  directed to development of the space of Retzius.  Incision was made lateral to the right medial umbilical ligament from the midline towards the internal ring coursing along the iliac vessels towards the area of the right ureter, which was positively  identified.  The right vas deferens was encountered, ligated and using a medial bucket handle at the right bladder was swept away from the pelvic sidewall towards the area of the endopelvic fascia on the right side.  A mirror image dissection was  performed on the left side.  Anterior attachments were taken down using cautery scissors to expose the anterior base of the prostate.  The bladder neck prostate base area was defatted to better demarcate this junction later.  This tissue was set aside  and labeled periprosthetic fat.  Next, 0.2 mL of an indocyanine green dye was injected into each lobe of the prostate using a percutaneously placed  robotically guided spinal needle with intervening dissection to avoid any spillage, which did not occur.   The endopelvic fascia was then swept away from the lateral aspect of the prostate and base orientation bilaterally to expose the dorsal venous complex was controlled using vascular stapler, taking exquisite care to avoid membranous urethral injury which  did not occur.  Approximately 10 minutes post-dye injection, the pelvis was inspected under infrared fluorescent light.  Sentinel lymph angiography revealed several small lymphatic channels coursing towards the pelvic lymph node fields bilaterally.  There is a small lymph node noted in a perivesical location on each side.  No obvious nodes in the external iliac, common  iliac, internal iliac or obturator group respectively.  As such, template lymphadenectomy was performed in the right side.  First, the right external iliac group confined with the boundaries being the right external iliac vein artery pelvic sidewall,  iliac bifurcation.  Lymphostasis was achieved with cold clips.  Next, the right obturator group was dissected free with the boundaries being right external iliac vein, pelvic sidewall, obturator nerve.  Lymphostasis was achieved with cold clips.  The  obturator nerve was inspected following these maneuvers and found to be uninjured.  A mirror image lymphadenectomy was performed in the left side left external iliac and left obturator groups respectively.  The left obturator nerve was also inspected and  found to be uninjured.  Next, small perivesical lymph nodes were dissected free on each side and labeled as such.  Attention was directed at bladder neck dissection.  A lateral release was performed on each side, which allowed better demarcation of the  bladder neck prostate junction, which was then carefully separated in the anterior and posterior direction keeping what appeared to be a rim of circular muscle fibers in each plane of  dissection.  The right side of the prostate, which notably the patient  had the most significant palpable preop abnormality, there was significant adherence of some of the bladder neck tissue and the bladder neck was somewhat asymmetric being wider on the right side.  Posterior dissection was performed by incising  approximately 7 mm inferior posterior to the posterior lobe of the prostate entering the plane of Denonvilliers.  Bilateral vas deferens were dissected for a distance of approximately 4 cm, ligated and placed on gentle superior traction.  Bilateral  seminal vesicles were dissected to their tip and placed on gentle superior traction.  Dissection proceeded within the plane of Denonvilliers towards the area of the apex of the prostate.  This exposed the vascular pedicles bilaterally.  These were  controlled using a very purposely wide sequential clipping technique in a base to apex orientation.  As expected on the right sided plane, the tissue was quite adherent and desmoplastic.  It was concerning for locally advanced disease and a wide as safe  dissection was performed on the right side.  Apical dissection was performed in the anterior plane by placing the prostate on gentle superior traction and transecting the membranous urethra coldly.  Again, going as distal as safely possible given the  high risk indication.  This completely freed the prostate.  The specimen was placed in an EndoCatch bag for later retrieval.  Digital rectal exam was performed using indicator glove, no evidence of rectal violation was noted.  Posterior reconstruction  was performed using a single 3-0 V-Loc suture reapproximating the posterior urethral plate to the posterior bladder neck, bringing the structures into tension-free apposition.  Attention was directed at bladder neck reconstruction.  Again, there was some  asymmetry to the bladder neck with it somewhat wider on the right side.  At the area of prior adherent tissue  this was reconstructed using a single running 2-0 Vicryl suture, which was then turned to the bladder neck into a symmetric orientation and  there was complete preservation of approximately 3/4 of the circumference of the bladder neck circular musculature.  Mucosa-to-mucosa anastomosis was performed using double arm V-Loc suture reapproximating from the 6 o'clock to 12 o'clock position,  bringing the structures into tension-free apposition.  A new Foley catheter was then placed which irrigated quantitatively.  All sponge and needle counts were correct.  Hemostasis appeared excellent.  A closed suction drain was brought out the previous  left lateral most robotic port site near the peritoneal cavity.  Robot was then undocked.  The previous right lateral most assistant port site was closed at the fascia using Carter-Thomason suture passer.  Specimen was retrieved by extending the previous  camera port inferiorly for a distance of approximately 3 cm, removing the prostatectomy specimen setting aside from pathology.  This extraction site was closed with fascia using figure-of-eight PDS x3 followed by reapproximation of Scarpa's with a  running Vicryl.  All incision sites were infiltrated with dilute lipolyzed Marcaine and closed at the level skin using subcuticular Monocryl followed by Dermabond.  Please note, first assistant Debbrah Alar was crucial for all portions of the procedure today.  She provided invaluable retraction, vascular clipping, specimen manipulation, suctioning.  TN/NUANCE  D:04/05/2018 T:04/05/2018 JOB:[id number]

## 2018-04-05 NOTE — Anesthesia Procedure Notes (Signed)
Procedure Name: Intubation Date/Time: 04/05/2018 7:38 AM Performed by: Anne Fu, CRNA Pre-anesthesia Checklist: Patient identified, Emergency Drugs available, Suction available, Patient being monitored and Timeout performed Patient Re-evaluated:Patient Re-evaluated prior to induction Oxygen Delivery Method: Circle system utilized Preoxygenation: Pre-oxygenation with 100% oxygen Induction Type: IV induction Ventilation: Mask ventilation without difficulty Laryngoscope Size: Mac and 4 Grade View: Grade II Tube type: Oral Tube size: 7.5 mm Number of attempts: 3 Airway Equipment and Method: Stylet and Bougie stylet Placement Confirmation: ETT inserted through vocal cords under direct vision,  positive ETCO2 and breath sounds checked- equal and bilateral Secured at: 23 cm Tube secured with: Tape Dental Injury: Teeth and Oropharynx as per pre-operative assessment  Comments: Slight anterior larynx with Grade II view -- obtained per MD Carigan with bougie noted clear bilateral lung sounds post intubation atraumatic induction oral airway as per pre-op.

## 2018-04-05 NOTE — Brief Op Note (Signed)
04/05/2018  10:26 AM  PATIENT:  Arneta Cliche  62 y.o. male  PRE-OPERATIVE DIAGNOSIS:  PROSTATE CANCER  POST-OPERATIVE DIAGNOSIS:  PROSTATE CANCER  PROCEDURE:  Procedure(s): XI ROBOTIC ASSISTED LAPAROSCOPIC RADICAL PROSTATECTOMY (N/A) LYMPHADENECTOMY (Bilateral)  SURGEON:  Surgeon(s) and Role:    Alexis Frock, MD - Primary  PHYSICIAN ASSISTANT:   ASSISTANTS: Debbrah Alar PA   ANESTHESIA:   local and general  EBL:  100 mL   BLOOD ADMINISTERED:none  DRAINS: 1 - JP to bulb, 2 - Foley to gravity   LOCAL MEDICATIONS USED:  MARCAINE     SPECIMEN:  Source of Specimen:  1 - periprostatic fat, 2 - prostatectomy, 3 - pelvic lymph nodes  DISPOSITION OF SPECIMEN:  PATHOLOGY  COUNTS:  YES  TOURNIQUET:  * No tourniquets in log *  DICTATION: .Other Dictation: Dictation Number [id number]  PLAN OF CARE: Admit for overnight observation  PATIENT DISPOSITION:  PACU - hemodynamically stable.   Delay start of Pharmacological VTE agent (>24hrs) due to surgical blood loss or risk of bleeding: yes

## 2018-04-06 ENCOUNTER — Encounter (HOSPITAL_COMMUNITY): Payer: Self-pay | Admitting: Urology

## 2018-04-06 DIAGNOSIS — C61 Malignant neoplasm of prostate: Secondary | ICD-10-CM | POA: Diagnosis not present

## 2018-04-06 LAB — HEMOGLOBIN AND HEMATOCRIT, BLOOD
HEMATOCRIT: 40 % (ref 39.0–52.0)
Hemoglobin: 13.2 g/dL (ref 13.0–17.0)

## 2018-04-06 LAB — BASIC METABOLIC PANEL
ANION GAP: 7 (ref 5–15)
BUN: 12 mg/dL (ref 8–23)
CALCIUM: 8.6 mg/dL — AB (ref 8.9–10.3)
CO2: 25 mmol/L (ref 22–32)
CREATININE: 0.96 mg/dL (ref 0.61–1.24)
Chloride: 102 mmol/L (ref 98–111)
Glucose, Bld: 135 mg/dL — ABNORMAL HIGH (ref 70–99)
Potassium: 4 mmol/L (ref 3.5–5.1)
Sodium: 134 mmol/L — ABNORMAL LOW (ref 135–145)

## 2018-04-06 NOTE — Progress Notes (Signed)
1 Day Post-Op Subjective: Patient reports he feels well. Walked the halls. Passing flatus.   Objective: Vital signs in last 24 hours: Temp:  [98.2 F (36.8 C)] 98.2 F (36.8 C) (08/24 0448) Pulse Rate:  [58-61] 58 (08/24 0448) Resp:  [16-18] 16 (08/24 0448) BP: (107-112)/(69-75) 111/69 (08/24 0448) SpO2:  [93 %-96 %] 96 % (08/24 0448)  Intake/Output from previous day: 08/23 0701 - 08/24 0700 In: 6108.4 [P.O.:207; I.V.:4223.9; IV Piggyback:1677.4] Out: 1940 [Urine:1600; Drains:240; Blood:100] Intake/Output this shift: Total I/O In: -  Out: 58 [Urine:700; Drains:60]  Physical Exam:  NAD Watching TV CV - RRR Abd - soft, NT, incisions C/D/I, JP serosanguinous drainage  Ext - no calf pain or swelling GU - foley in place, urine clear.   Lab Results: Recent Labs    04/05/18 1100 04/06/18 0533  HGB 15.0 13.2  HCT 46.3 40.0   BMET Recent Labs    04/06/18 0533  NA 134*  K 4.0  CL 102  CO2 25  GLUCOSE 135*  BUN 12  CREATININE 0.96  CALCIUM 8.6*   No results for input(s): LABPT, INR in the last 72 hours. No results for input(s): LABURIN in the last 72 hours. No results found for this or any previous visit.  Studies/Results: No results found.  Assessment/Plan:  RALP - POD#1  Doing well  SLIV  Reg diet  Will d/c home later today if stable    LOS: 0 days   Festus Aloe 04/06/2018, 1:04 PM

## 2018-08-09 IMAGING — MR MR PELVIS WO/W CM
4 of 8 series · 19 of 48 positions shown · IV contrast (Y)
Comparison: None.

CLINICAL DATA: New diagnosis of prostate cancer. Evaluate for
metastatic disease.

EXAM:
MRI PELVIS WITHOUT AND WITH CONTRAST
TECHNIQUE: Multiplanar multisequence MR imaging of the pelvis was performed
both before and after administration of intravenous contrast.
CONTRAST:  18mL MULTIHANCE GADOBENATE DIMEGLUMINE 529 MG/ML IV SOLN

[Series 3: T1 · axial · 4.0mm · 0.62mm/px · z∈[-121,+134]mm · 7 of 52 slices shown (1 of 2)]
[im 1/52]
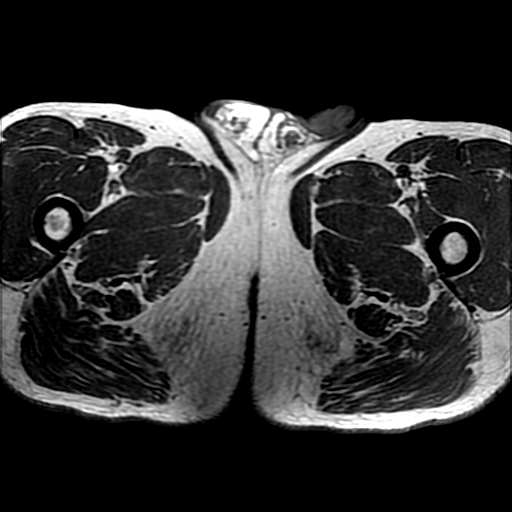
[im 9/52]
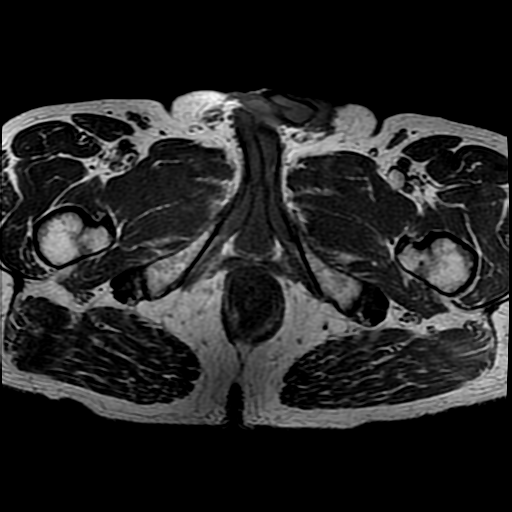
[im 18/52]
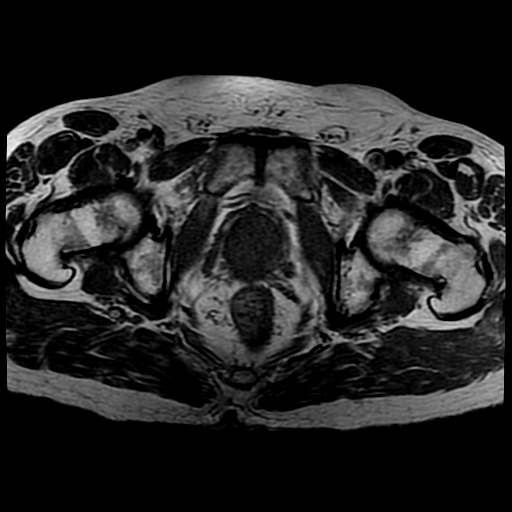
[im 26/52]
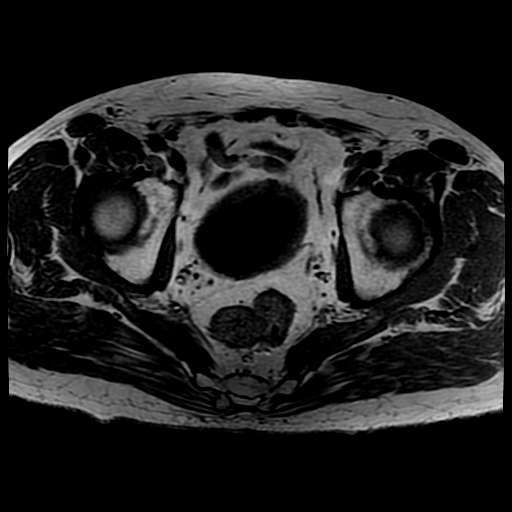
[im 35/52]
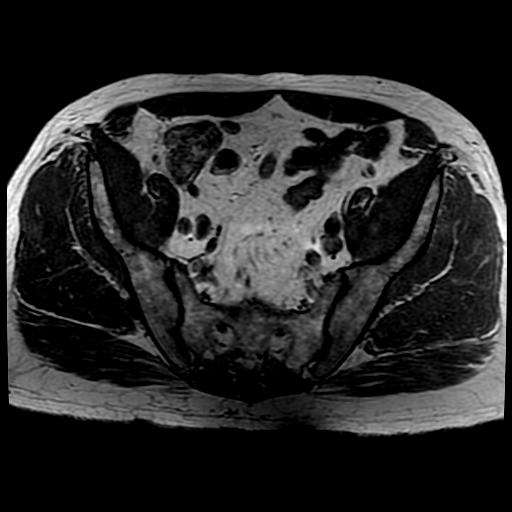
[im 43/52]
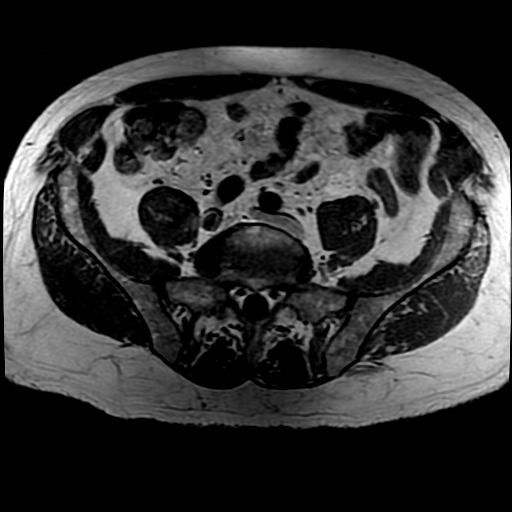
[im 52/52]
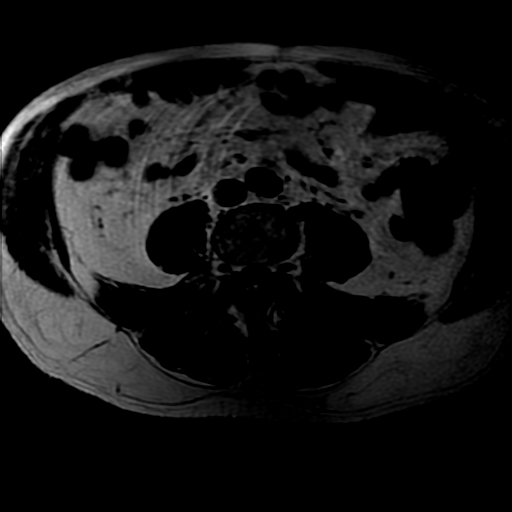

[Series 4: T2 fat-sat · axial · 4.0mm · 0.62mm/px · z∈[-121,+89]mm · 6 of 52 slices shown]
[im 1/52]
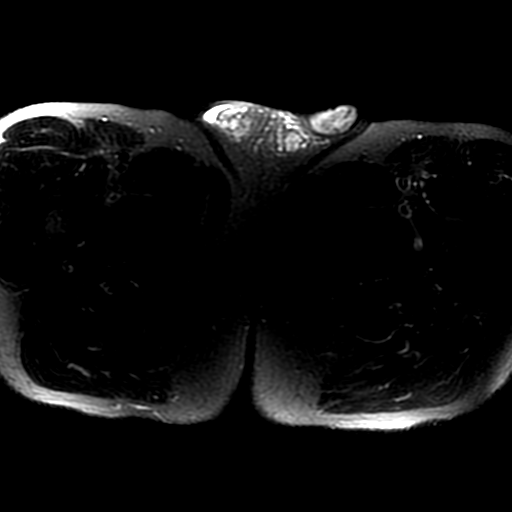
[im 9/52]
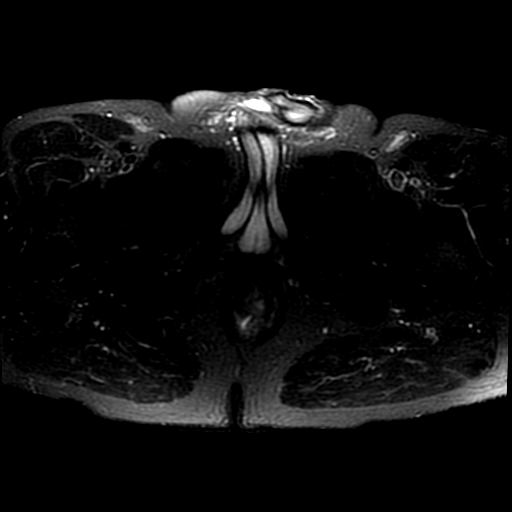
[im 18/52]
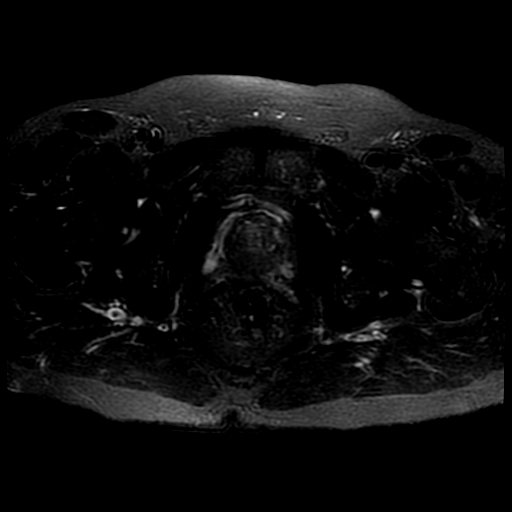
[im 26/52]
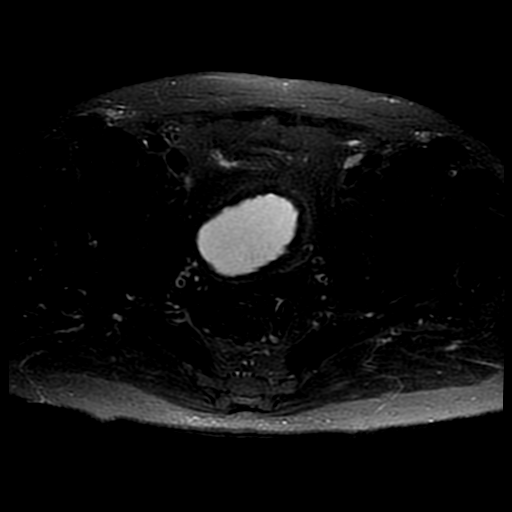
[im 35/52]
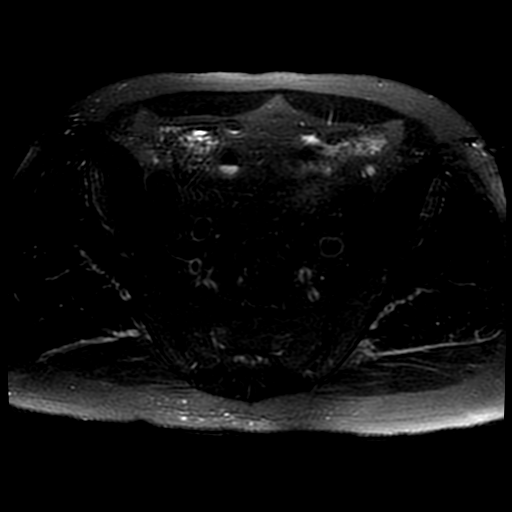
[im 43/52]
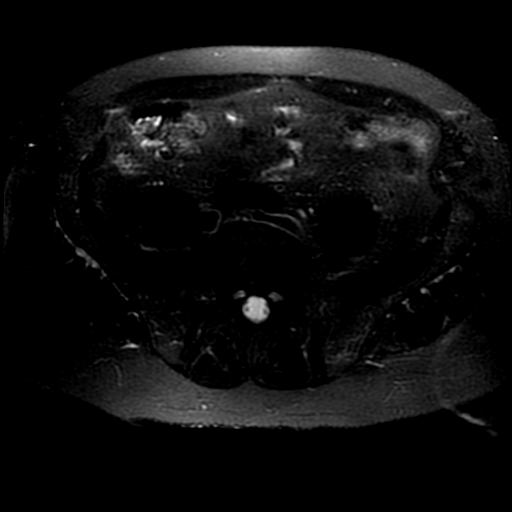

[Series 5: T1 · coronal · 4.0mm · 0.78mm/px · 3 of 43 slices shown (2 of 2)]
[im 1/43]
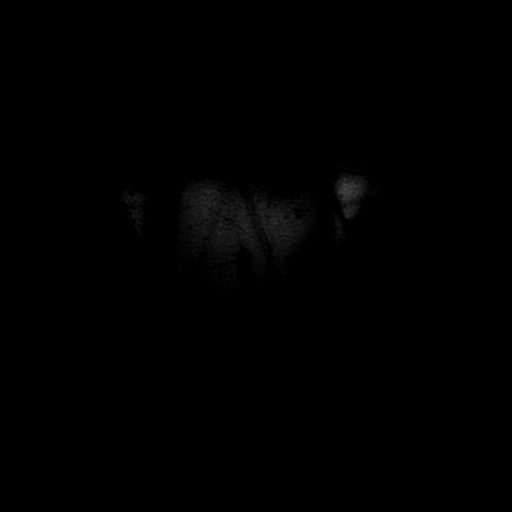
[im 22/43]
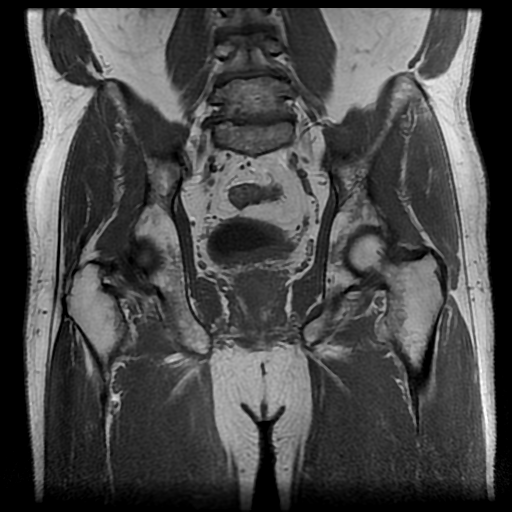
[im 43/43]
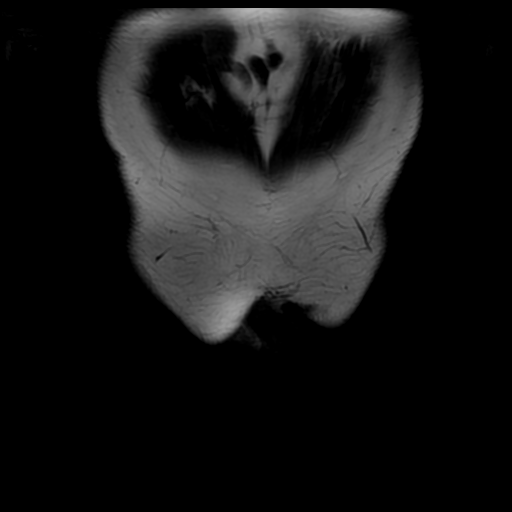

[Series 6: cor fse ir · coronal · 4.0mm · 0.78mm/px · 3 of 43 slices shown]
[im 1/43]
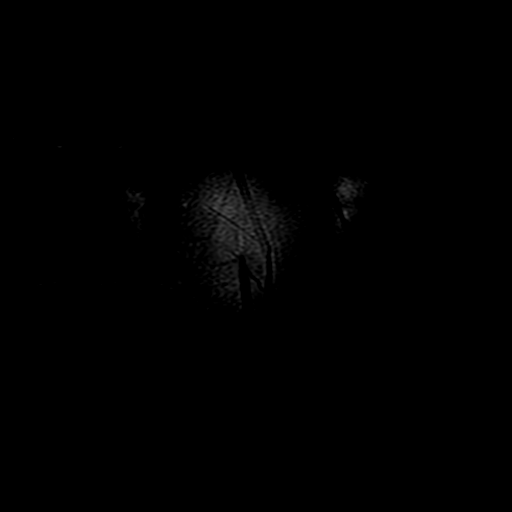
[im 22/43]
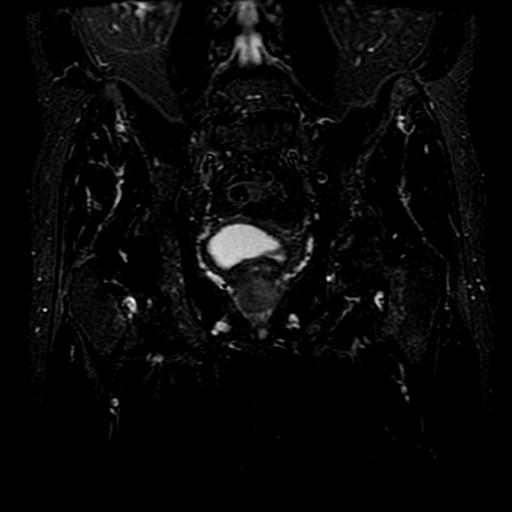
[im 43/43]
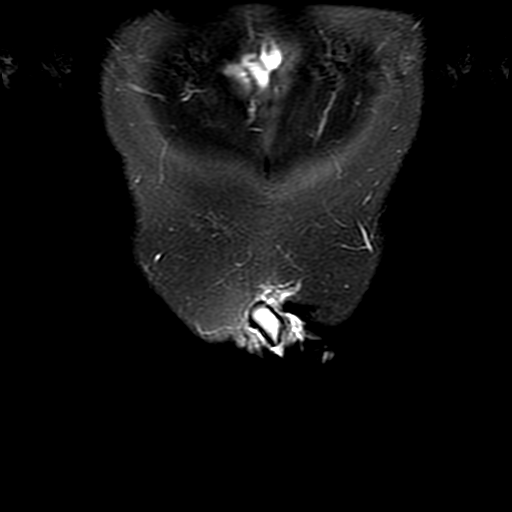

[19 of 48 positions shown; findings below may reference images not displayed]

FINDINGS: Bones: No hip fracture, dislocation or avascular necrosis. No
periosteal reaction or bone destruction. No aggressive osseous
lesion.

Normal sacrum and sacroiliac joints. No SI joint widening or erosive
changes. Degenerative disc disease with disc height loss at L5-S1
with a small broad-based disc bulge.

Articular cartilage and labrum

Articular cartilage:  No chondral defect.

Labrum: Grossly intact, but evaluation is limited by lack of
intraarticular fluid.

Joint or bursal effusion

Joint effusion:  No hip joint effusion.  No SI joint effusion.

Bursae:  No bursa formation.

Muscles and tendons

Flexors: Normal.

Extensors: Normal.

Abductors: Normal.

Adductors: Normal.

Gluteals: Normal.

Hamstrings: Normal.

Other findings

Miscellaneous: No pelvic free fluid. No fluid collection or
hematoma. No inguinal lymphadenopathy. No inguinal hernia. Relative
bladder wall thickening.
IMPRESSION: 1. No evidence of osseous metastatic disease of the pelvis.

## 2018-10-21 ENCOUNTER — Other Ambulatory Visit (HOSPITAL_COMMUNITY): Payer: Self-pay | Admitting: Urology

## 2018-10-21 DIAGNOSIS — C61 Malignant neoplasm of prostate: Secondary | ICD-10-CM

## 2018-10-31 ENCOUNTER — Encounter (HOSPITAL_COMMUNITY)
Admission: RE | Admit: 2018-10-31 | Discharge: 2018-10-31 | Disposition: A | Discharge status: Home / Self Care | Payer: BLUE CROSS/BLUE SHIELD | Source: Ambulatory Visit | Attending: Urology | Admitting: Urology

## 2018-10-31 ENCOUNTER — Other Ambulatory Visit: Payer: Self-pay

## 2018-10-31 DIAGNOSIS — C61 Malignant neoplasm of prostate: Secondary | ICD-10-CM | POA: Diagnosis present

## 2018-10-31 MED ORDER — AXUMIN (FLUCICLOVINE F 18) INJECTION
10.1000 | Freq: Once | INTRAVENOUS | Status: AC | PRN
  Administered 2018-10-31: 10.1 via INTRAVENOUS

## 2018-11-11 ENCOUNTER — Telehealth: Payer: Self-pay | Admitting: Radiation Oncology

## 2018-11-11 NOTE — Telephone Encounter (Signed)
New message:     Lft msg for pt to return call to setup appt from referral received.

## 2018-11-25 ENCOUNTER — Encounter: Payer: Self-pay | Admitting: Radiation Oncology

## 2018-11-25 NOTE — Progress Notes (Signed)
Radiation Oncology         [phone] ________________________________  Initial Outpatient Consultation - Conducted via WebEx due to current COVID-19 concerns for limiting patient exposure  Name: Andre Cowan MRN: [id number]  Date: 11/26/2018  DOB: 1956-02-14  BH:ALPFX, Earnest Conroy, MD  Alexis Frock, MD   REFERRING PHYSICIAN: Alexis Frock, MD  DIAGNOSIS: 63 y.o. gentleman with detectable postoperative PSA of 14.3 s/p RALP for Stage pT3b N1 Mx, Gleason 4+5 adenocarcinoma of the prostate with preoperative PSA of 47.    ICD-10-CM   1. Prostate cancer [facility]) C61     HISTORY OF PRESENT ILLNESS: Andre Cowan is a 63 y.o. male with a diagnosis of prostate cancer. He was initially diagnosed with high volume, high risk, Gleason 4+5 prostate cancer with PSA of 46.6 on 01/10/18. Preoperative staging pelvic MRI and bone scan showed the disease to be localized with no evidence of overt sidewall invasion. He elected to undergo robotic prostatectomy with ICG sentinel and template lymphadenectomy in 03/2018 with final pathology revealing pT3b N1 Mx, Gleason 4+5 disease with bilateral EPE, SVI, LVI, multifocal positive margins at sites of extraprostatic extension and 1 of 24 lymph nodes positive.  Unfortunately, his postoperative PSA remained elevated at 14.3 when last checked on 07/05/2018. An Axumin PET scan was performed on 10/31/2018 for disease restaging and was without evidence of lymphadenopathy, skeletal metastasis or local recurrence.  He has regained good bladder control since the time of his procedure and is currently down to 1 liner in his shorts per day for security more than anything.  The patient reviewed the surgical pathology and postop PSA results with his urologist and he has kindly been referred today for discussion of potential adjuvant radiation treatment options.   PREVIOUS RADIATION THERAPY: No  PAST MEDICAL HISTORY:  Past Medical History:  Diagnosis Date  . Headache    Migraines  . Prostate cancer ([place])       PAST SURGICAL HISTORY: Past Surgical History:  Procedure Laterality Date  . CERVICAL DISCECTOMY    . CYST EXCISION  1976   Spine  . LYMPHADENECTOMY Bilateral 04/05/2018   Procedure: LYMPHADENECTOMY;  Surgeon: Alexis Frock, MD;  Location: [facility];  Service: Urology;  Laterality: Bilateral;  . PROSTATE BIOPSY    . ROBOT ASSISTED LAPAROSCOPIC RADICAL PROSTATECTOMY N/A 04/05/2018   Procedure: XI ROBOTIC ASSISTED LAPAROSCOPIC RADICAL PROSTATECTOMY;  Surgeon: Alexis Frock, MD;  Location: [facility];  Service: Urology;  Laterality: N/A;  . TONSILLECTOMY      FAMILY HISTORY:  Family History  Problem Relation Age of Onset  . Prostate cancer Neg Hx   . Breast cancer Neg Hx   . Colon cancer Neg Hx   . Pancreatic cancer Neg Hx     SOCIAL HISTORY:  Social History   Socioeconomic History  . Marital status: Married    Spouse name: Not on file  . Number of children: 3  . Years of education: Not on file  . Highest education level: Not on file  Occupational History    Comment: working full time   Social Needs  . Financial resource strain: Not on file  . Food insecurity:    Worry: Not on file    Inability: Not on file  . Transportation needs:    Medical: Not on file    Non-medical: Not on file  Tobacco Use  . Smoking status: Former Smoker    Packs/day: 0.50    Years: 40.00    Pack years: 20.00  Types: Cigarettes    Last attempt to quit: 09/15/2015    Years since quitting: 3.2  . Smokeless tobacco: Never Used  Substance and Sexual Activity  . Alcohol use: Never    Frequency: Never  . Drug use: Never  . Sexual activity: Not Currently  Lifestyle  . Physical activity:    Days per week: Not on file    Minutes per session: Not on file  . Stress: Not on file  Relationships  . Social connections:    Talks on phone: Not on file    Gets together: Not on file    Attends religious service: Not on file    Active member of club or  organization: Not on file    Attends meetings of clubs or organizations: Not on file    Relationship status: Not on file  . Intimate partner violence:    Fear of current or ex partner: Not on file    Emotionally abused: Not on file    Physically abused: Not on file    Forced sexual activity: Not on file  Other Topics Concern  . Not on file  Social History Narrative  . Not on file    ALLERGIES: Patient has no known allergies.  MEDICATIONS:  No current outpatient medications on file.   No current facility-administered medications for this encounter.     REVIEW OF SYSTEMS:  On review of systems, the patient reports that he is doing well overall. He denies any chest pain, shortness of breath, cough, fevers, chills, night sweats, unintended weight changes. He denies any bowel disturbances, and denies abdominal pain, nausea or vomiting. He reports intermittent left hip pain. His IPSS was 7, indicating moderate urinary symptoms. He reports urinary leakage requiring 1 liner per day and nocturia. His SHIM was 1, indicating he has severe erectile dysfunction. This is a result of his non-nerve sparing prostatectomy. A complete review of systems is obtained and is otherwise negative.    PHYSICAL EXAM:  Wt Readings from Last 3 Encounters:  11/26/18 186 lb (84.4 kg)  04/05/18 197 lb 6 oz (89.5 kg)  03/29/18 197 lb 6 oz (89.5 kg)   Temp Readings from Last 3 Encounters:  04/06/18 98.2 F (36.8 C) (Oral)  03/29/18 98.1 F (36.7 C) (Oral)  01/11/18 98.1 F (36.7 C) (Oral)   BP Readings from Last 3 Encounters:  04/06/18 111/69  03/29/18 131/77  01/11/18 120/82   Pulse Readings from Last 3 Encounters:  04/06/18 (!) 58  03/29/18 83  01/11/18 84   Pain Assessment Pain Score: 0-No pain Pain Frequency: Intermittent Pain Loc: Hip(left )/10  In general this is a well appearing Caucasian gentleman in no acute distress. He's alert and oriented x4 and appropriate throughout the examination.  Cardiopulmonary assessment is negative for acute distress and he exhibits normal effort.    KPS = 90  100 - Normal; no complaints; no evidence of disease. 90   - Able to carry on normal activity; minor signs or symptoms of disease. 80   - Normal activity with effort; some signs or symptoms of disease. 82   - Cares for self; unable to carry on normal activity or to do active work. 60   - Requires occasional assistance, but is able to care for most of his personal needs. 50   - Requires considerable assistance and frequent medical care. 78   - Disabled; requires special care and assistance. 75   - Severely disabled; hospital admission is indicated although death  not imminent. 39   - Very sick; hospital admission necessary; active supportive treatment necessary. 10   - Moribund; fatal processes progressing rapidly. 0     - Dead  Karnofsky DA, Abelmann [place], Craver LS and Burchenal [facility] [phone]) The use of the nitrogen mustards in the palliative treatment of carcinoma: with particular reference to bronchogenic carcinoma Cancer 1 634-56  LABORATORY DATA:  Lab Results  Component Value Date   WBC 8.0 03/29/2018   HGB 13.2 04/06/2018   HCT 40.0 04/06/2018   MCV 94.1 03/29/2018   PLT 293 03/29/2018   Lab Results  Component Value Date   NA 134 (L) 04/06/2018   K 4.0 04/06/2018   CL 102 04/06/2018   CO2 25 04/06/2018   No results found for: ALT, AST, GGT, ALKPHOS, BILITOT   RADIOGRAPHY: Nm Pet (axumin) Skull Base To Mid Thigh  Result Date: 10/31/2018 CLINICAL DATA:  Prostate carcinoma with biochemical recurrence. EXAM: NUCLEAR MEDICINE PET SKULL BASE TO THIGH TECHNIQUE: 10.1 mCi F-18 Fluciclovine was injected intravenously. Full-ring PET imaging was performed from the skull base to thigh after the radiotracer. CT data was obtained and used for attenuation correction and anatomic localization. COMPARISON:  Bone scan 02/08/2018 FINDINGS: NECK No radiotracer activity in neck lymph nodes.  Incidental CT finding: None CHEST No radiotracer accumulation within mediastinal or hilar lymph nodes. No suspicious pulmonary nodules on the CT scan. Incidental CT finding: None ABDOMEN/PELVIS Prostate: No focal activity in the prostate bed. Lymph nodes: No abnormal radiotracer accumulation within pelvic or abdominal nodes. Liver: No evidence of liver metastasis Incidental CT finding: Gallstones noted. SKELETON No focal  activity to suggest skeletal metastasis. IMPRESSION: 1. No evidence of local recurrence in the prostate bed. 2. No fluciclovine PET evidence of metastatic adenopathy in the pelvis or periaortic retroperitoneum. 3. No evidence soft tissue metastasis. No evidence of skeletal metastasis. Electronically Signed   By: Suzy Bouchard M.D.   On: 10/31/2018 17:03      IMPRESSION/PLAN: 1. 63 y.o. gentleman with detectable postoperative PSA of 14.3 s/p RALP for Stage pT3b N1 Mx, Gleason 4+5 adenocarcinoma of the prostate with preoperative PSA of 47. This visit was conducted via WebEx to spare the patient unnecessary potential exposure in the healthcare setting during the current COVID-19 pandemic. Today we reviewed the findings and workup thus far.  We discussed the natural history of prostate cancer.  We reviewed the the implications of positive margins, extracapsular extension, seminal vesicle involvement and detectable postoperative PSA indicating residual prostate cancer in the postoperative setting. We reviewed some of the evidence suggesting an advantage for patients who undergo adjuvant radiotherapy in the setting in terms of disease control and overall survival. We discussed radiation treatment directed to the prostatic fossa with regard to the logistics and delivery of external beam radiation treatment. We discussed and outlined the risks, benefits, short and long-term effects associated with radiotherapy. We also detailed the role of ADT in the treatment of high risk prostate cancer and  outlined the associated side effects that could be expected with this therapy.  At the end of the conversation the patient is interested in moving forward with 7.5 weeks of external beam therapy in combination with ADT. He has not received his first Lupron injection. We will contact [facility] to make arrangements for start of ADT and request a repeat PSA at that time to provide a baseline PSA prior to start of radiation since it has now been almost 6 months since his last PSA test.  We will share our discussion with Dr. Tresa Moore and move forward with treatment planning in anticipation of beginning IMRT in the near future. To protect the patient from unnecessary potential exposure to COVID-19 in the healthcare setting, we would recommend postponing the start of his radiation treatment until June 2020 as he will be protected from disease progression in the interim with the start of ADT.  Given current concerns for patient exposure during the COVID-19 pandemic, this encounter was conducted via WebEx. The patient was notified in advance and has given verbal consent for this type of encounter. The time spent during this encounter was 50 minutes. The attendants for this meeting include Tyler Pita MD, Ashlyn Bruning PA-C, [place], and patient Kruz Chiu. During the encounter, Tyler Pita MD, Ashlyn Bruning PA-C, and scribe, Wilburn Mylar were located at [facility].  Patient, Rockne Dearinger, was located at home.    Nicholos Johns, PA-C    Tyler Pita, MD  [facility] Direct Dial: [phone]  Fax: [phone] [email or web address]  Skype  [email or web address]   This document serves as a record of services personally performed by Tyler Pita, MD and Freeman Caldron, PA-C. It was created on their behalf by Wilburn Mylar, a trained medical scribe. The creation of this record is based on the scribe's  personal observations and the provider's statements to them. This document has been checked and approved by the attending provider.

## 2018-11-25 NOTE — Progress Notes (Signed)
GU Location of Tumor / Histology: Prostatic adenocarcinoma s/p robotic prostatectomy on 03/2018 with multifocal positive margins.  If Prostate Cancer, Gleason Score is (4 + 5) and PSA is (47) pre op. Post op PSA 14.3 on 07/05/2018.   Andre Cowan was discovered to have an elevated PSA on 11/2017 by his PCP, Terrill Mohr, MD. Dr. Daron Offer referred the patient to Dr. Tresa Moore for further workup.   Pathology from prostatectomy:   Past/Anticipated interventions by urology, if any: biopsy, prostatectomy, bone scan (negative), PET scan (negative), referral for consideration of adjuvant radiotherapy  Past/Anticipated interventions by medical oncology, if any: no  Weight changes, if any: denies  Bowel/Bladder complaints, if any: Reports urinary leakage requiring 1 liner per day. Reports nocturia. Reports ED and considering Trimix trial. AUA 7. SHIM 1.   Nausea/Vomiting, if any: no  Pain issues, if any:  Intermittent Left hip pain  SAFETY ISSUES:  Prior radiation? no  Pacemaker/ICD? no  Possible current pregnancy? no, male patient  Is the patient on methotrexate? no  Current Complaints / other details:  63 year old male. Married with 2 daughters and 1 son. First grandchild born six months ago. Patient able to visit with grandchild in February. NKDA. Works in [employer] and travels most weekdays.

## 2018-11-26 ENCOUNTER — Ambulatory Visit
Admission: RE | Admit: 2018-11-26 | Discharge: 2018-11-26 | Disposition: A | Discharge status: Home / Self Care | Payer: BLUE CROSS/BLUE SHIELD | Source: Ambulatory Visit | Attending: Radiation Oncology | Admitting: Radiation Oncology

## 2018-11-26 ENCOUNTER — Other Ambulatory Visit: Payer: Self-pay

## 2018-11-26 ENCOUNTER — Encounter: Payer: Self-pay | Admitting: Radiation Oncology

## 2018-11-26 VITALS — Ht 71.0 in | Wt 186.0 lb

## 2018-11-26 DIAGNOSIS — C61 Malignant neoplasm of prostate: Secondary | ICD-10-CM

## 2018-11-26 HISTORY — DX: Malignant neoplasm of prostate: C61

## 2018-11-26 NOTE — Progress Notes (Signed)
See progress note under physician encounter. 

## 2018-11-27 ENCOUNTER — Telehealth: Payer: Self-pay | Admitting: *Deleted

## 2018-11-27 NOTE — Telephone Encounter (Signed)
CALLED PATIENT TO INFORM THAT I LVM FOR DR. MANNY'S NURSE (ANGEL) TO ARRANGE ADT FOR THIS PATIENT, PT. VERIFIED UNDERSTANDING THIS

## 2018-11-27 NOTE — Telephone Encounter (Signed)
Called patient to inform of ADT and repeat PSA for Monday April 20 - arrival time- 11 am @ Dr. Zettie Cowan Office , spoke with patient and he is aware of this appt.

## 2018-11-29 ENCOUNTER — Telehealth: Payer: Self-pay | Admitting: Medical Oncology

## 2018-11-29 NOTE — Telephone Encounter (Signed)
Left voicemail to introduce myself as the prostate nurse navigator and my role. I was unable to meet him due to consult was conducted via WebEx due to current COVID-19 concerns for limiting patient exposure. He is post robotic prostatectomy 04/05/18 with rising PSA. He is scheduled for PSA lab and  ADT 4/20 with Dr. Zettie Pho office. I informed him that I am working remotely and asked him to call my office and leave a voicemail with questions or concerns and I will return his call.

## 2018-12-05 ENCOUNTER — Telehealth: Payer: Self-pay | Admitting: *Deleted

## 2018-12-05 NOTE — Telephone Encounter (Signed)
CALLED PATIENT TO INFORM SIM APPT. FOR 01-10-19 @ 11 AM , SPOKE WITH PATIENT AND HE IS AWARE OF THIS APPT.

## 2018-12-10 ENCOUNTER — Encounter: Payer: Self-pay | Admitting: Urology

## 2018-12-10 NOTE — Progress Notes (Signed)
His PSA was 19.90 at [facility] on 4/20. He was started on Bicalutamide and scheduled to return to get his Lupron 5/5. He is scheduled for [facility] 01/10/19.  Nicholos Johns, MMS, PA-C [facility] [place]: [phone]  Fax: [phone]

## 2018-12-26 ENCOUNTER — Encounter: Payer: Self-pay | Admitting: Medical Oncology

## 2019-01-09 NOTE — Progress Notes (Signed)
  Radiation Oncology         [phone] ________________________________  Name: Andre Cowan MRN: [id number]  Date: 01/10/2019  DOB: 1955/11/29  SIMULATION AND TREATMENT PLANNING NOTE    ICD-10-CM   1. Prostate cancer ([facility]) C61     DIAGNOSIS:  63 y.o. gentleman with detectable postoperative PSA of 14.3 s/p RALP for Stage pT3b N1 Mx, Gleason 4+5 adenocarcinoma of the prostate with preoperative PSA of 47  NARRATIVE:  The patient was brought to the [facility].  Identity was confirmed.  All relevant records and images related to the planned course of therapy were reviewed.  The patient freely provided informed written consent to proceed with treatment after reviewing the details related to the planned course of therapy. The consent form was witnessed and verified by the simulation staff.  Then, the patient was set-up in a stable reproducible supine position for radiation therapy.  A vacuum lock pillow device was custom fabricated to position his legs in a reproducible immobilized position.  Then, I performed a urethrogram under sterile conditions to identify the prostatic bed.  CT images were obtained.  Surface markings were placed.  The CT images were loaded into the planning software.  Then the prostate bed target, pelvic lymph node target and avoidance structures including the rectum, bladder, bowel and hips were contoured.  Treatment planning then occurred.  The radiation prescription was entered and confirmed.  A total of one complex treatment devices were fabricated. I have requested : Intensity Modulated Radiotherapy (IMRT) is medically necessary for this case for the following reason:  Rectal sparing.Marland Kitchen  PLAN:  The patient will receive 45 Gy in 25 fractions of 1.8 Gy, followed by a boost to the prostate bed to a total dose of 68.4 Gy with 13 additional fractions of 1.8 Gy.   ________________________________  Sheral Apley Tammi Klippel, M.D.

## 2019-01-10 ENCOUNTER — Ambulatory Visit
Admission: RE | Admit: 2019-01-10 | Discharge: 2019-01-10 | Disposition: A | Discharge status: Home / Self Care | Payer: BC Managed Care – PPO | Source: Ambulatory Visit | Attending: Radiation Oncology | Admitting: Radiation Oncology

## 2019-01-10 ENCOUNTER — Encounter: Payer: Self-pay | Admitting: Medical Oncology

## 2019-01-10 ENCOUNTER — Other Ambulatory Visit: Payer: Self-pay

## 2019-01-10 DIAGNOSIS — Z510 Encounter for antineoplastic radiation therapy: Secondary | ICD-10-CM | POA: Diagnosis not present

## 2019-01-10 DIAGNOSIS — C61 Malignant neoplasm of prostate: Secondary | ICD-10-CM | POA: Diagnosis present

## 2019-01-20 DIAGNOSIS — C61 Malignant neoplasm of prostate: Secondary | ICD-10-CM | POA: Diagnosis present

## 2019-01-20 DIAGNOSIS — Z510 Encounter for antineoplastic radiation therapy: Secondary | ICD-10-CM | POA: Insufficient documentation

## 2019-01-21 ENCOUNTER — Ambulatory Visit
Admission: RE | Admit: 2019-01-21 | Discharge: 2019-01-21 | Disposition: A | Discharge status: Home / Self Care | Payer: BC Managed Care – PPO | Source: Ambulatory Visit | Attending: Radiation Oncology | Admitting: Radiation Oncology

## 2019-01-21 ENCOUNTER — Other Ambulatory Visit: Payer: Self-pay

## 2019-01-21 DIAGNOSIS — Z510 Encounter for antineoplastic radiation therapy: Secondary | ICD-10-CM | POA: Diagnosis not present

## 2019-01-22 ENCOUNTER — Ambulatory Visit
Admission: RE | Admit: 2019-01-22 | Discharge: 2019-01-22 | Disposition: A | Discharge status: Home / Self Care | Payer: BC Managed Care – PPO | Source: Ambulatory Visit | Attending: Radiation Oncology | Admitting: Radiation Oncology

## 2019-01-22 ENCOUNTER — Other Ambulatory Visit: Payer: Self-pay

## 2019-01-22 DIAGNOSIS — Z510 Encounter for antineoplastic radiation therapy: Secondary | ICD-10-CM | POA: Diagnosis not present

## 2019-01-23 ENCOUNTER — Ambulatory Visit
Admission: RE | Admit: 2019-01-23 | Discharge: 2019-01-23 | Disposition: A | Discharge status: Home / Self Care | Payer: BC Managed Care – PPO | Source: Ambulatory Visit | Attending: Radiation Oncology | Admitting: Radiation Oncology

## 2019-01-23 ENCOUNTER — Other Ambulatory Visit: Payer: Self-pay

## 2019-01-23 DIAGNOSIS — Z510 Encounter for antineoplastic radiation therapy: Secondary | ICD-10-CM | POA: Diagnosis not present

## 2019-01-24 ENCOUNTER — Other Ambulatory Visit: Payer: Self-pay

## 2019-01-24 ENCOUNTER — Ambulatory Visit
Admission: RE | Admit: 2019-01-24 | Discharge: 2019-01-24 | Disposition: A | Discharge status: Home / Self Care | Payer: BC Managed Care – PPO | Source: Ambulatory Visit | Attending: Radiation Oncology | Admitting: Radiation Oncology

## 2019-01-24 DIAGNOSIS — Z510 Encounter for antineoplastic radiation therapy: Secondary | ICD-10-CM | POA: Diagnosis not present

## 2019-01-27 ENCOUNTER — Other Ambulatory Visit: Payer: Self-pay

## 2019-01-27 ENCOUNTER — Ambulatory Visit
Admission: RE | Admit: 2019-01-27 | Discharge: 2019-01-27 | Disposition: A | Discharge status: Home / Self Care | Payer: BC Managed Care – PPO | Source: Ambulatory Visit | Attending: Radiation Oncology | Admitting: Radiation Oncology

## 2019-01-27 DIAGNOSIS — Z510 Encounter for antineoplastic radiation therapy: Secondary | ICD-10-CM | POA: Diagnosis not present

## 2019-01-28 ENCOUNTER — Ambulatory Visit
Admission: RE | Admit: 2019-01-28 | Discharge: 2019-01-28 | Disposition: A | Discharge status: Home / Self Care | Payer: BC Managed Care – PPO | Source: Ambulatory Visit | Attending: Radiation Oncology | Admitting: Radiation Oncology

## 2019-01-28 ENCOUNTER — Other Ambulatory Visit: Payer: Self-pay

## 2019-01-28 DIAGNOSIS — Z510 Encounter for antineoplastic radiation therapy: Secondary | ICD-10-CM | POA: Diagnosis not present

## 2019-01-29 ENCOUNTER — Other Ambulatory Visit: Payer: Self-pay

## 2019-01-29 ENCOUNTER — Ambulatory Visit
Admission: RE | Admit: 2019-01-29 | Discharge: 2019-01-29 | Disposition: A | Discharge status: Home / Self Care | Payer: BC Managed Care – PPO | Source: Ambulatory Visit | Attending: Radiation Oncology | Admitting: Radiation Oncology

## 2019-01-29 DIAGNOSIS — Z510 Encounter for antineoplastic radiation therapy: Secondary | ICD-10-CM | POA: Diagnosis not present

## 2019-01-30 ENCOUNTER — Other Ambulatory Visit: Payer: Self-pay

## 2019-01-30 ENCOUNTER — Ambulatory Visit
Admission: RE | Admit: 2019-01-30 | Discharge: 2019-01-30 | Disposition: A | Discharge status: Home / Self Care | Payer: BC Managed Care – PPO | Source: Ambulatory Visit | Attending: Radiation Oncology | Admitting: Radiation Oncology

## 2019-01-30 DIAGNOSIS — Z510 Encounter for antineoplastic radiation therapy: Secondary | ICD-10-CM | POA: Diagnosis not present

## 2019-01-31 ENCOUNTER — Other Ambulatory Visit: Payer: Self-pay

## 2019-01-31 ENCOUNTER — Ambulatory Visit
Admission: RE | Admit: 2019-01-31 | Discharge: 2019-01-31 | Disposition: A | Discharge status: Home / Self Care | Payer: BC Managed Care – PPO | Source: Ambulatory Visit | Attending: Radiation Oncology | Admitting: Radiation Oncology

## 2019-01-31 DIAGNOSIS — Z510 Encounter for antineoplastic radiation therapy: Secondary | ICD-10-CM | POA: Diagnosis not present

## 2019-02-03 ENCOUNTER — Ambulatory Visit
Admission: RE | Admit: 2019-02-03 | Discharge: 2019-02-03 | Disposition: A | Discharge status: Home / Self Care | Payer: BC Managed Care – PPO | Source: Ambulatory Visit | Attending: Radiation Oncology | Admitting: Radiation Oncology

## 2019-02-03 ENCOUNTER — Other Ambulatory Visit: Payer: Self-pay

## 2019-02-03 DIAGNOSIS — C61 Malignant neoplasm of prostate: Secondary | ICD-10-CM

## 2019-02-03 DIAGNOSIS — Z510 Encounter for antineoplastic radiation therapy: Secondary | ICD-10-CM | POA: Diagnosis not present

## 2019-02-04 ENCOUNTER — Other Ambulatory Visit: Payer: Self-pay

## 2019-02-04 ENCOUNTER — Ambulatory Visit
Admission: RE | Admit: 2019-02-04 | Discharge: 2019-02-04 | Disposition: A | Discharge status: Home / Self Care | Payer: BC Managed Care – PPO | Source: Ambulatory Visit | Attending: Radiation Oncology | Admitting: Radiation Oncology

## 2019-02-04 DIAGNOSIS — Z510 Encounter for antineoplastic radiation therapy: Secondary | ICD-10-CM | POA: Diagnosis not present

## 2019-02-05 ENCOUNTER — Other Ambulatory Visit: Payer: Self-pay

## 2019-02-05 ENCOUNTER — Ambulatory Visit
Admission: RE | Admit: 2019-02-05 | Discharge: 2019-02-05 | Disposition: A | Discharge status: Home / Self Care | Payer: BC Managed Care – PPO | Source: Ambulatory Visit | Attending: Radiation Oncology | Admitting: Radiation Oncology

## 2019-02-05 DIAGNOSIS — Z510 Encounter for antineoplastic radiation therapy: Secondary | ICD-10-CM | POA: Diagnosis not present

## 2019-02-06 ENCOUNTER — Ambulatory Visit
Admission: RE | Admit: 2019-02-06 | Discharge: 2019-02-06 | Disposition: A | Discharge status: Home / Self Care | Payer: BC Managed Care – PPO | Source: Ambulatory Visit | Attending: Radiation Oncology | Admitting: Radiation Oncology

## 2019-02-06 ENCOUNTER — Other Ambulatory Visit: Payer: Self-pay

## 2019-02-06 DIAGNOSIS — Z510 Encounter for antineoplastic radiation therapy: Secondary | ICD-10-CM | POA: Diagnosis not present

## 2019-02-06 NOTE — Progress Notes (Signed)
  Radiation Oncology         [phone] ________________________________  Name: Andre Cowan MRN: [id number]  Date: 02/03/2019  DOB: 14-Oct-1955  SIMULATION AND TREATMENT PLANNING NOTE    ICD-10-CM   1. Prostate cancer ([place])  C61     NARRATIVE:  The patient is currently receiving IMRT and his rectal filling was less than his initial CT sim filling.  He reports that the CT sim occurred in the afternoon, when his rectum refills, and his treatment time is morning after evacuation.  The patient was brought to the [facility].  Identity was confirmed.  Then, the patient was set-up in a stable reproducible  supine position for radiation therapy.  CT images were obtained.  Surface markings were placed.  The CT images were loaded into the planning software.  Then the target and avoidance structures were contoured.  Treatment planning then occurred.    PLAN:  The patient will continue radiation with a new plan to better match his rectal filling.  ________________________________  Sheral Apley. Tammi Klippel, M.D.

## 2019-02-07 ENCOUNTER — Ambulatory Visit
Admission: RE | Admit: 2019-02-07 | Discharge: 2019-02-07 | Disposition: A | Discharge status: Home / Self Care | Payer: BC Managed Care – PPO | Source: Ambulatory Visit | Attending: Radiation Oncology | Admitting: Radiation Oncology

## 2019-02-07 ENCOUNTER — Other Ambulatory Visit: Payer: Self-pay

## 2019-02-07 DIAGNOSIS — Z510 Encounter for antineoplastic radiation therapy: Secondary | ICD-10-CM | POA: Diagnosis not present

## 2019-02-10 ENCOUNTER — Ambulatory Visit
Admission: RE | Admit: 2019-02-10 | Discharge: 2019-02-10 | Disposition: A | Discharge status: Home / Self Care | Payer: BC Managed Care – PPO | Source: Ambulatory Visit | Attending: Radiation Oncology | Admitting: Radiation Oncology

## 2019-02-10 ENCOUNTER — Other Ambulatory Visit: Payer: Self-pay

## 2019-02-10 DIAGNOSIS — Z510 Encounter for antineoplastic radiation therapy: Secondary | ICD-10-CM | POA: Diagnosis not present

## 2019-02-11 ENCOUNTER — Ambulatory Visit
Admission: RE | Admit: 2019-02-11 | Discharge: 2019-02-11 | Disposition: A | Discharge status: Home / Self Care | Payer: BC Managed Care – PPO | Source: Ambulatory Visit | Attending: Radiation Oncology | Admitting: Radiation Oncology

## 2019-02-11 ENCOUNTER — Other Ambulatory Visit: Payer: Self-pay

## 2019-02-11 DIAGNOSIS — Z510 Encounter for antineoplastic radiation therapy: Secondary | ICD-10-CM | POA: Diagnosis not present

## 2019-02-12 ENCOUNTER — Ambulatory Visit
Admission: RE | Admit: 2019-02-12 | Discharge: 2019-02-12 | Disposition: A | Discharge status: Home / Self Care | Payer: BC Managed Care – PPO | Source: Ambulatory Visit | Attending: Radiation Oncology | Admitting: Radiation Oncology

## 2019-02-12 ENCOUNTER — Other Ambulatory Visit: Payer: Self-pay

## 2019-02-12 DIAGNOSIS — Z510 Encounter for antineoplastic radiation therapy: Secondary | ICD-10-CM | POA: Insufficient documentation

## 2019-02-12 DIAGNOSIS — C61 Malignant neoplasm of prostate: Secondary | ICD-10-CM | POA: Diagnosis present

## 2019-02-13 ENCOUNTER — Other Ambulatory Visit: Payer: Self-pay

## 2019-02-13 ENCOUNTER — Ambulatory Visit
Admission: RE | Admit: 2019-02-13 | Discharge: 2019-02-13 | Disposition: A | Discharge status: Home / Self Care | Payer: BC Managed Care – PPO | Source: Ambulatory Visit | Attending: Radiation Oncology | Admitting: Radiation Oncology

## 2019-02-13 DIAGNOSIS — Z510 Encounter for antineoplastic radiation therapy: Secondary | ICD-10-CM | POA: Diagnosis not present

## 2019-02-17 ENCOUNTER — Other Ambulatory Visit: Payer: Self-pay

## 2019-02-17 ENCOUNTER — Ambulatory Visit
Admission: RE | Admit: 2019-02-17 | Discharge: 2019-02-17 | Disposition: A | Discharge status: Home / Self Care | Payer: BC Managed Care – PPO | Source: Ambulatory Visit | Attending: Radiation Oncology | Admitting: Radiation Oncology

## 2019-02-17 DIAGNOSIS — Z510 Encounter for antineoplastic radiation therapy: Secondary | ICD-10-CM | POA: Diagnosis not present

## 2019-02-18 ENCOUNTER — Ambulatory Visit
Admission: RE | Admit: 2019-02-18 | Discharge: 2019-02-18 | Disposition: A | Discharge status: Home / Self Care | Payer: BC Managed Care – PPO | Source: Ambulatory Visit | Attending: Radiation Oncology | Admitting: Radiation Oncology

## 2019-02-18 ENCOUNTER — Other Ambulatory Visit: Payer: Self-pay

## 2019-02-18 DIAGNOSIS — Z510 Encounter for antineoplastic radiation therapy: Secondary | ICD-10-CM | POA: Diagnosis not present

## 2019-02-19 ENCOUNTER — Ambulatory Visit
Admission: RE | Admit: 2019-02-19 | Discharge: 2019-02-19 | Disposition: A | Discharge status: Home / Self Care | Payer: BC Managed Care – PPO | Source: Ambulatory Visit | Attending: Radiation Oncology | Admitting: Radiation Oncology

## 2019-02-19 ENCOUNTER — Other Ambulatory Visit: Payer: Self-pay

## 2019-02-19 DIAGNOSIS — Z510 Encounter for antineoplastic radiation therapy: Secondary | ICD-10-CM | POA: Diagnosis not present

## 2019-02-20 ENCOUNTER — Ambulatory Visit
Admission: RE | Admit: 2019-02-20 | Discharge: 2019-02-20 | Disposition: A | Discharge status: Home / Self Care | Payer: BC Managed Care – PPO | Source: Ambulatory Visit | Attending: Radiation Oncology | Admitting: Radiation Oncology

## 2019-02-20 ENCOUNTER — Other Ambulatory Visit: Payer: Self-pay

## 2019-02-20 DIAGNOSIS — Z510 Encounter for antineoplastic radiation therapy: Secondary | ICD-10-CM | POA: Diagnosis not present

## 2019-02-21 ENCOUNTER — Ambulatory Visit
Admission: RE | Admit: 2019-02-21 | Discharge: 2019-02-21 | Disposition: A | Discharge status: Home / Self Care | Payer: BC Managed Care – PPO | Source: Ambulatory Visit | Attending: Radiation Oncology | Admitting: Radiation Oncology

## 2019-02-21 ENCOUNTER — Other Ambulatory Visit: Payer: Self-pay

## 2019-02-21 DIAGNOSIS — Z510 Encounter for antineoplastic radiation therapy: Secondary | ICD-10-CM | POA: Diagnosis not present

## 2019-02-24 ENCOUNTER — Ambulatory Visit
Admission: RE | Admit: 2019-02-24 | Discharge: 2019-02-24 | Disposition: A | Discharge status: Home / Self Care | Payer: BC Managed Care – PPO | Source: Ambulatory Visit | Attending: Radiation Oncology | Admitting: Radiation Oncology

## 2019-02-24 ENCOUNTER — Other Ambulatory Visit: Payer: Self-pay

## 2019-02-24 DIAGNOSIS — Z510 Encounter for antineoplastic radiation therapy: Secondary | ICD-10-CM | POA: Diagnosis not present

## 2019-02-25 ENCOUNTER — Ambulatory Visit
Admission: RE | Admit: 2019-02-25 | Discharge: 2019-02-25 | Disposition: A | Discharge status: Home / Self Care | Payer: BC Managed Care – PPO | Source: Ambulatory Visit | Attending: Radiation Oncology | Admitting: Radiation Oncology

## 2019-02-25 ENCOUNTER — Other Ambulatory Visit: Payer: Self-pay

## 2019-02-25 DIAGNOSIS — Z510 Encounter for antineoplastic radiation therapy: Secondary | ICD-10-CM | POA: Diagnosis not present

## 2019-02-26 ENCOUNTER — Ambulatory Visit
Admission: RE | Admit: 2019-02-26 | Discharge: 2019-02-26 | Disposition: A | Discharge status: Home / Self Care | Payer: BC Managed Care – PPO | Source: Ambulatory Visit | Attending: Radiation Oncology | Admitting: Radiation Oncology

## 2019-02-26 ENCOUNTER — Other Ambulatory Visit: Payer: Self-pay

## 2019-02-26 DIAGNOSIS — Z510 Encounter for antineoplastic radiation therapy: Secondary | ICD-10-CM | POA: Diagnosis not present

## 2019-02-27 ENCOUNTER — Other Ambulatory Visit: Payer: Self-pay

## 2019-02-27 ENCOUNTER — Ambulatory Visit
Admission: RE | Admit: 2019-02-27 | Discharge: 2019-02-27 | Disposition: A | Discharge status: Home / Self Care | Payer: BC Managed Care – PPO | Source: Ambulatory Visit | Attending: Radiation Oncology | Admitting: Radiation Oncology

## 2019-02-27 DIAGNOSIS — Z510 Encounter for antineoplastic radiation therapy: Secondary | ICD-10-CM | POA: Diagnosis not present

## 2019-02-28 ENCOUNTER — Other Ambulatory Visit: Payer: Self-pay

## 2019-02-28 ENCOUNTER — Ambulatory Visit
Admission: RE | Admit: 2019-02-28 | Discharge: 2019-02-28 | Disposition: A | Discharge status: Home / Self Care | Payer: BC Managed Care – PPO | Source: Ambulatory Visit | Attending: Radiation Oncology | Admitting: Radiation Oncology

## 2019-02-28 DIAGNOSIS — Z510 Encounter for antineoplastic radiation therapy: Secondary | ICD-10-CM | POA: Diagnosis not present

## 2019-03-03 ENCOUNTER — Other Ambulatory Visit: Payer: Self-pay

## 2019-03-03 ENCOUNTER — Ambulatory Visit
Admission: RE | Admit: 2019-03-03 | Discharge: 2019-03-03 | Disposition: A | Discharge status: Home / Self Care | Payer: BC Managed Care – PPO | Source: Ambulatory Visit | Attending: Radiation Oncology | Admitting: Radiation Oncology

## 2019-03-03 DIAGNOSIS — Z510 Encounter for antineoplastic radiation therapy: Secondary | ICD-10-CM | POA: Diagnosis not present

## 2019-03-04 ENCOUNTER — Other Ambulatory Visit: Payer: Self-pay

## 2019-03-04 ENCOUNTER — Ambulatory Visit
Admission: RE | Admit: 2019-03-04 | Discharge: 2019-03-04 | Disposition: A | Discharge status: Home / Self Care | Payer: BC Managed Care – PPO | Source: Ambulatory Visit | Attending: Radiation Oncology | Admitting: Radiation Oncology

## 2019-03-04 DIAGNOSIS — Z510 Encounter for antineoplastic radiation therapy: Secondary | ICD-10-CM | POA: Diagnosis not present

## 2019-03-05 ENCOUNTER — Other Ambulatory Visit: Payer: Self-pay

## 2019-03-05 ENCOUNTER — Ambulatory Visit
Admission: RE | Admit: 2019-03-05 | Discharge: 2019-03-05 | Disposition: A | Discharge status: Home / Self Care | Payer: BC Managed Care – PPO | Source: Ambulatory Visit | Attending: Radiation Oncology | Admitting: Radiation Oncology

## 2019-03-05 DIAGNOSIS — Z510 Encounter for antineoplastic radiation therapy: Secondary | ICD-10-CM | POA: Diagnosis not present

## 2019-03-06 ENCOUNTER — Ambulatory Visit
Admission: RE | Admit: 2019-03-06 | Discharge: 2019-03-06 | Disposition: A | Discharge status: Home / Self Care | Payer: BC Managed Care – PPO | Source: Ambulatory Visit | Attending: Radiation Oncology | Admitting: Radiation Oncology

## 2019-03-06 ENCOUNTER — Other Ambulatory Visit: Payer: Self-pay

## 2019-03-06 DIAGNOSIS — Z510 Encounter for antineoplastic radiation therapy: Secondary | ICD-10-CM | POA: Diagnosis not present

## 2019-03-07 ENCOUNTER — Other Ambulatory Visit: Payer: Self-pay

## 2019-03-07 ENCOUNTER — Ambulatory Visit
Admission: RE | Admit: 2019-03-07 | Discharge: 2019-03-07 | Disposition: A | Discharge status: Home / Self Care | Payer: BC Managed Care – PPO | Source: Ambulatory Visit | Attending: Radiation Oncology | Admitting: Radiation Oncology

## 2019-03-07 DIAGNOSIS — Z510 Encounter for antineoplastic radiation therapy: Secondary | ICD-10-CM | POA: Diagnosis not present

## 2019-03-10 ENCOUNTER — Ambulatory Visit
Admission: RE | Admit: 2019-03-10 | Discharge: 2019-03-10 | Disposition: A | Discharge status: Home / Self Care | Payer: BC Managed Care – PPO | Source: Ambulatory Visit | Attending: Radiation Oncology | Admitting: Radiation Oncology

## 2019-03-10 ENCOUNTER — Other Ambulatory Visit: Payer: Self-pay

## 2019-03-10 DIAGNOSIS — Z510 Encounter for antineoplastic radiation therapy: Secondary | ICD-10-CM | POA: Diagnosis not present

## 2019-03-11 ENCOUNTER — Other Ambulatory Visit: Payer: Self-pay

## 2019-03-11 ENCOUNTER — Ambulatory Visit
Admission: RE | Admit: 2019-03-11 | Discharge: 2019-03-11 | Disposition: A | Discharge status: Home / Self Care | Payer: BC Managed Care – PPO | Source: Ambulatory Visit | Attending: Radiation Oncology | Admitting: Radiation Oncology

## 2019-03-11 DIAGNOSIS — Z510 Encounter for antineoplastic radiation therapy: Secondary | ICD-10-CM | POA: Diagnosis not present

## 2019-03-12 ENCOUNTER — Ambulatory Visit
Admission: RE | Admit: 2019-03-12 | Discharge: 2019-03-12 | Disposition: A | Discharge status: Home / Self Care | Payer: BC Managed Care – PPO | Source: Ambulatory Visit | Attending: Radiation Oncology | Admitting: Radiation Oncology

## 2019-03-12 ENCOUNTER — Other Ambulatory Visit: Payer: Self-pay

## 2019-03-12 DIAGNOSIS — Z510 Encounter for antineoplastic radiation therapy: Secondary | ICD-10-CM | POA: Diagnosis not present

## 2019-03-13 ENCOUNTER — Ambulatory Visit
Admission: RE | Admit: 2019-03-13 | Discharge: 2019-03-13 | Disposition: A | Discharge status: Home / Self Care | Payer: BC Managed Care – PPO | Source: Ambulatory Visit | Attending: Radiation Oncology | Admitting: Radiation Oncology

## 2019-03-13 ENCOUNTER — Other Ambulatory Visit: Payer: Self-pay

## 2019-03-13 DIAGNOSIS — Z510 Encounter for antineoplastic radiation therapy: Secondary | ICD-10-CM | POA: Diagnosis not present

## 2019-03-14 ENCOUNTER — Ambulatory Visit
Admission: RE | Admit: 2019-03-14 | Discharge: 2019-03-14 | Disposition: A | Discharge status: Home / Self Care | Payer: BC Managed Care – PPO | Source: Ambulatory Visit | Attending: Radiation Oncology | Admitting: Radiation Oncology

## 2019-03-14 ENCOUNTER — Encounter: Payer: Self-pay | Admitting: Radiation Oncology

## 2019-03-14 ENCOUNTER — Other Ambulatory Visit: Payer: Self-pay

## 2019-03-14 DIAGNOSIS — Z510 Encounter for antineoplastic radiation therapy: Secondary | ICD-10-CM | POA: Diagnosis not present

## 2019-04-06 NOTE — Progress Notes (Signed)
  Radiation Oncology         [phone] ________________________________  Name: Andre Cowan MRN: [id number]  Date: 03/14/2019  DOB: 1955-10-20  End of Treatment Note  Diagnosis:   63 y.o. gentleman with detectable postoperative PSA of 14.3 s/p RALP for Stage pT3b N1 Mx, Gleason 4+5 adenocarcinoma of the prostate with preoperative PSA of 47     Indication for treatment:  Curative, Definitive Radiotherapy       Radiation treatment dates:   01/21/19-03/14/19  Site/dose:  1. The prostate fossa and pelvic lymph nodes were initially treated to 45 Gy in 25 fractions of 1.8 Gy  2. The prostate fossa only was boosted to 68.4 Gy with 13 additional fractions of 1.8 Gy   Beams/energy:  1. The prostate fossa  and pelvic lymph nodes were initially treated using VMAT intensity modulated radiotherapy delivering 6 megavolt photons. Image guidance was performed with CB-CT studies prior to each fraction. He was immobilized with a body fix lower extremity mold.  2. The prostate fossa only was boosted using VMAT intensity modulated radiotherapy delivering 6 megavolt photons. Image guidance was performed with CB-CT studies prior to each fraction. He was immobilized with a body fix lower extremity mold.  Narrative: The patient tolerated radiation treatment relatively well.   The patient experienced some minor urinary irritation and modest fatigue.    Plan: The patient has completed radiation treatment. He will return to radiation oncology clinic for routine followup in one month. I advised him to call or return sooner if he has any questions or concerns related to his recovery or treatment. ________________________________  Sheral Apley. Tammi Klippel, M.D.

## 2019-04-23 ENCOUNTER — Ambulatory Visit
Admission: RE | Admit: 2019-04-23 | Discharge: 2019-04-23 | Disposition: A | Discharge status: Home / Self Care | Payer: BC Managed Care – PPO | Source: Ambulatory Visit | Attending: Urology | Admitting: Urology

## 2019-04-23 ENCOUNTER — Other Ambulatory Visit: Payer: Self-pay

## 2019-04-23 ENCOUNTER — Other Ambulatory Visit: Payer: Self-pay | Admitting: Urology

## 2019-04-23 DIAGNOSIS — C61 Malignant neoplasm of prostate: Secondary | ICD-10-CM

## 2019-04-23 MED ORDER — MIRABEGRON ER 25 MG PO TB24: 25.0000 mg | ORAL_TABLET | Freq: Every day | ORAL | 2 refills | Status: DC

## 2019-04-23 NOTE — Progress Notes (Signed)
  Radiation Oncology         [phone] ________________________________  Name: Andre Cowan MRN: [id number]  Date: 04/23/2019  DOB: 1956/04/11  Post Treatment Note  CC: Nickola Major, MD  Alexis Frock, MD  Diagnosis:   63 y.o. gentleman with detectable postoperative PSA of 14.3 s/p RALP for Stage pT3b N1 Mx, Gleason 4+5 adenocarcinoma of the prostate with preoperative PSA of 47     Interval Since Last Radiation:  5.5 weeks, concurrent with ADT 01/21/19-03/14/19: 1. The prostate fossa and pelvic lymph nodes were initially treated to 45 Gy in 25 fractions of 1.8 Gy  2. The prostate fossa only was boosted to 68.4 Gy with 13 additional fractions of 1.8 Gy   Narrative:  I spoke with the patient to conduct his routine scheduled 1 month follow up visit via telephone to spare the patient unnecessary potential exposure in the healthcare setting during the current COVID-19 pandemic.  The patient was notified in advance and gave permission to proceed with this visit format. He tolerated radiation treatment relatively well with only minor urinary irritation and modest fatigue.                                On review of systems, the patient states that he is doing well overall.  He continues with increased frequency, urgency and nocturia every 2 hours at night.  He specifically denies dysuria, gross hematuria, weak stream, straining to void or incomplete emptying.  He denies abdominal pain, nausea, vomiting, diarrhea or constipation.  He reports a healthy appetite and is maintaining his weight.  He continues with mild fatigue but overall continues to tolerate the ADT well.  ALLERGIES:  has No Known Allergies.  Meds: No current outpatient medications on file.   No current facility-administered medications for this encounter.     Physical Findings:  vitals were not taken for this visit.   /10 Unable to assess due to telephone follow-up visit format.  Lab Findings: Lab Results   Component Value Date   WBC 8.0 03/29/2018   HGB 13.2 04/06/2018   HCT 40.0 04/06/2018   MCV 94.1 03/29/2018   PLT 293 03/29/2018     Radiographic Findings: No results found.  Impression/Plan: 1. 63 y.o. gentleman with detectable postoperative PSA of 14.3 s/p RALP for Stage pT3b N1 Mx, Gleason 4+5 adenocarcinoma of the prostate with preoperative PSA of 47. He will continue to follow up with urology for ongoing PSA determinations and has an appointment scheduled with Dr. Tresa Moore on 06/23/2019.  In the interim, we will try a trial of Myrbetriq 25 mg p.o. nightly to help manage his LUTS.  He understands what to expect with regards to PSA monitoring going forward.  He anticipates completing a 2-year course of ADT under the care and direction of Dr. Tresa Moore.  I will look forward to following his response to treatment via correspondence with urology, and would be happy to continue to participate in his care if clinically indicated. I talked to the patient about what to expect in the future, including his risk for erectile dysfunction and rectal bleeding. I encouraged him to call or return to the office if he has any questions regarding his previous radiation or possible radiation side effects. He was comfortable with this plan and will follow up as needed.       Nicholos Johns, PA-C

## 2019-04-28 NOTE — Addendum Note (Signed)
Encounter addended by: Sherrlyn Hock, LPN on: [id number] PM  Actions taken: Order Reconciliation Section accessed

## 2019-04-30 ENCOUNTER — Telehealth: Payer: Self-pay | Admitting: Radiation Oncology

## 2019-04-30 NOTE — Telephone Encounter (Signed)
Submitted prior authorization electronically via [facility]. Request number [id number]. [employer] to respond to request within three business days. Questions for BCBS can be directed to [phone]. The following are therapeutic alternatives: oxybutynin chloride, oxybutynin chloride ER, tolterodine tartrate, tolterodine tartrate ER, trospium chloride, and trospium chloride ER.

## 2019-05-08 ENCOUNTER — Telehealth: Payer: Self-pay | Admitting: Radiation Oncology

## 2019-05-08 ENCOUNTER — Other Ambulatory Visit: Payer: Self-pay | Admitting: Urology

## 2019-05-08 MED ORDER — TROSPIUM CHLORIDE ER 60 MG PO CP24: 60.0000 mg | ORAL_CAPSULE | Freq: Every day | ORAL | 2 refills | Status: DC

## 2019-05-08 NOTE — Telephone Encounter (Signed)
Received voicemail from patient inquiring about prior authorization for Myrbetriq. Phoned patient back and explained insurance denied coverage. Explained that [employer], PA-C has escribed  trospium chloride ER. Patient verbalized his dislike of medications. Reviewed potential side effects of trospium with patient: dry mouth and constipation. Reinforced the benefit of this medication is greater than the potential side effects. Went onto explain that should this medication not work for him since we are limited by insurance he would need to follow up with his urologist. Patient verbalized understanding and expressed appreciation for the return call.

## 2019-07-04 ENCOUNTER — Other Ambulatory Visit: Payer: Self-pay | Admitting: Urology

## 2019-12-29 DIAGNOSIS — C61 Malignant neoplasm of prostate: Secondary | ICD-10-CM | POA: Diagnosis not present

## 2020-01-05 DIAGNOSIS — C775 Secondary and unspecified malignant neoplasm of intrapelvic lymph nodes: Secondary | ICD-10-CM | POA: Diagnosis not present

## 2020-01-05 DIAGNOSIS — N393 Stress incontinence (female) (male): Secondary | ICD-10-CM | POA: Diagnosis not present

## 2020-01-05 DIAGNOSIS — N5231 Erectile dysfunction following radical prostatectomy: Secondary | ICD-10-CM | POA: Diagnosis not present

## 2020-01-05 DIAGNOSIS — C61 Malignant neoplasm of prostate: Secondary | ICD-10-CM | POA: Diagnosis not present

## 2020-01-05 DIAGNOSIS — Z5111 Encounter for antineoplastic chemotherapy: Secondary | ICD-10-CM | POA: Diagnosis not present

## 2020-07-05 DIAGNOSIS — C61 Malignant neoplasm of prostate: Secondary | ICD-10-CM | POA: Diagnosis not present

## 2020-07-05 DIAGNOSIS — N5231 Erectile dysfunction following radical prostatectomy: Secondary | ICD-10-CM | POA: Diagnosis not present

## 2020-07-12 DIAGNOSIS — R3915 Urgency of urination: Secondary | ICD-10-CM | POA: Diagnosis not present

## 2020-07-12 DIAGNOSIS — R310 Gross hematuria: Secondary | ICD-10-CM | POA: Diagnosis not present

## 2020-07-12 DIAGNOSIS — C61 Malignant neoplasm of prostate: Secondary | ICD-10-CM | POA: Diagnosis not present

## 2020-07-12 DIAGNOSIS — C775 Secondary and unspecified malignant neoplasm of intrapelvic lymph nodes: Secondary | ICD-10-CM | POA: Diagnosis not present

## 2020-09-17 DIAGNOSIS — Z20822 Contact with and (suspected) exposure to covid-19: Secondary | ICD-10-CM | POA: Diagnosis not present

## 2020-09-23 DIAGNOSIS — Z20822 Contact with and (suspected) exposure to covid-19: Secondary | ICD-10-CM | POA: Diagnosis not present

## 2020-09-24 DIAGNOSIS — B351 Tinea unguium: Secondary | ICD-10-CM | POA: Diagnosis not present

## 2020-09-24 DIAGNOSIS — M2042 Other hammer toe(s) (acquired), left foot: Secondary | ICD-10-CM | POA: Diagnosis not present

## 2020-09-24 DIAGNOSIS — M7752 Other enthesopathy of left foot: Secondary | ICD-10-CM | POA: Diagnosis not present

## 2020-09-24 DIAGNOSIS — M2041 Other hammer toe(s) (acquired), right foot: Secondary | ICD-10-CM | POA: Diagnosis not present

## 2020-09-24 DIAGNOSIS — L97521 Non-pressure chronic ulcer of other part of left foot limited to breakdown of skin: Secondary | ICD-10-CM | POA: Diagnosis not present

## 2020-10-04 DIAGNOSIS — L97521 Non-pressure chronic ulcer of other part of left foot limited to breakdown of skin: Secondary | ICD-10-CM | POA: Diagnosis not present

## 2020-10-15 DIAGNOSIS — L03032 Cellulitis of left toe: Secondary | ICD-10-CM | POA: Diagnosis not present

## 2020-10-15 DIAGNOSIS — Z01812 Encounter for preprocedural laboratory examination: Secondary | ICD-10-CM | POA: Diagnosis not present

## 2020-10-15 DIAGNOSIS — L03031 Cellulitis of right toe: Secondary | ICD-10-CM | POA: Diagnosis not present

## 2020-10-22 DIAGNOSIS — M2041 Other hammer toe(s) (acquired), right foot: Secondary | ICD-10-CM | POA: Diagnosis not present

## 2020-10-22 DIAGNOSIS — M2042 Other hammer toe(s) (acquired), left foot: Secondary | ICD-10-CM | POA: Diagnosis not present

## 2020-10-22 DIAGNOSIS — B351 Tinea unguium: Secondary | ICD-10-CM | POA: Diagnosis not present

## 2020-10-22 DIAGNOSIS — L97521 Non-pressure chronic ulcer of other part of left foot limited to breakdown of skin: Secondary | ICD-10-CM | POA: Diagnosis not present

## 2020-11-05 DIAGNOSIS — L03116 Cellulitis of left lower limb: Secondary | ICD-10-CM | POA: Diagnosis not present

## 2020-11-05 DIAGNOSIS — M2042 Other hammer toe(s) (acquired), left foot: Secondary | ICD-10-CM | POA: Diagnosis not present

## 2020-11-05 DIAGNOSIS — L03115 Cellulitis of right lower limb: Secondary | ICD-10-CM | POA: Diagnosis not present

## 2020-11-05 DIAGNOSIS — M2041 Other hammer toe(s) (acquired), right foot: Secondary | ICD-10-CM | POA: Diagnosis not present

## 2020-11-12 DIAGNOSIS — L03116 Cellulitis of left lower limb: Secondary | ICD-10-CM | POA: Diagnosis not present

## 2020-11-12 DIAGNOSIS — L03115 Cellulitis of right lower limb: Secondary | ICD-10-CM | POA: Diagnosis not present

## 2020-11-19 DIAGNOSIS — L03031 Cellulitis of right toe: Secondary | ICD-10-CM | POA: Diagnosis not present

## 2020-11-19 DIAGNOSIS — M19071 Primary osteoarthritis, right ankle and foot: Secondary | ICD-10-CM | POA: Diagnosis not present

## 2020-11-19 DIAGNOSIS — M19072 Primary osteoarthritis, left ankle and foot: Secondary | ICD-10-CM | POA: Diagnosis not present

## 2020-11-19 DIAGNOSIS — L03032 Cellulitis of left toe: Secondary | ICD-10-CM | POA: Diagnosis not present

## 2020-11-29 DIAGNOSIS — M19072 Primary osteoarthritis, left ankle and foot: Secondary | ICD-10-CM | POA: Diagnosis not present

## 2020-11-29 DIAGNOSIS — L03115 Cellulitis of right lower limb: Secondary | ICD-10-CM | POA: Diagnosis not present

## 2020-11-29 DIAGNOSIS — M19071 Primary osteoarthritis, right ankle and foot: Secondary | ICD-10-CM | POA: Diagnosis not present

## 2020-11-29 DIAGNOSIS — L03116 Cellulitis of left lower limb: Secondary | ICD-10-CM | POA: Diagnosis not present

## 2020-12-06 DIAGNOSIS — L03116 Cellulitis of left lower limb: Secondary | ICD-10-CM | POA: Diagnosis not present

## 2020-12-06 DIAGNOSIS — L03115 Cellulitis of right lower limb: Secondary | ICD-10-CM | POA: Diagnosis not present

## 2020-12-27 DIAGNOSIS — C61 Malignant neoplasm of prostate: Secondary | ICD-10-CM | POA: Diagnosis not present

## 2021-01-03 DIAGNOSIS — C775 Secondary and unspecified malignant neoplasm of intrapelvic lymph nodes: Secondary | ICD-10-CM | POA: Diagnosis not present

## 2021-01-03 DIAGNOSIS — C61 Malignant neoplasm of prostate: Secondary | ICD-10-CM | POA: Diagnosis not present

## 2021-01-03 DIAGNOSIS — R3915 Urgency of urination: Secondary | ICD-10-CM | POA: Diagnosis not present

## 2021-01-03 DIAGNOSIS — N5231 Erectile dysfunction following radical prostatectomy: Secondary | ICD-10-CM | POA: Diagnosis not present

## 2021-01-29 DIAGNOSIS — Z720 Tobacco use: Secondary | ICD-10-CM | POA: Diagnosis not present

## 2021-01-29 DIAGNOSIS — J4520 Mild intermittent asthma, uncomplicated: Secondary | ICD-10-CM | POA: Diagnosis not present

## 2021-01-29 DIAGNOSIS — R059 Cough, unspecified: Secondary | ICD-10-CM | POA: Diagnosis not present

## 2021-01-29 DIAGNOSIS — Z20822 Contact with and (suspected) exposure to covid-19: Secondary | ICD-10-CM | POA: Diagnosis not present

## 2021-06-27 DIAGNOSIS — C61 Malignant neoplasm of prostate: Secondary | ICD-10-CM | POA: Diagnosis not present

## 2021-07-04 DIAGNOSIS — R310 Gross hematuria: Secondary | ICD-10-CM | POA: Diagnosis not present

## 2021-07-04 DIAGNOSIS — R3915 Urgency of urination: Secondary | ICD-10-CM | POA: Diagnosis not present

## 2021-07-04 DIAGNOSIS — C61 Malignant neoplasm of prostate: Secondary | ICD-10-CM | POA: Diagnosis not present

## 2021-07-04 DIAGNOSIS — N5231 Erectile dysfunction following radical prostatectomy: Secondary | ICD-10-CM | POA: Diagnosis not present

## 2021-07-04 DIAGNOSIS — C775 Secondary and unspecified malignant neoplasm of intrapelvic lymph nodes: Secondary | ICD-10-CM | POA: Diagnosis not present

## 2021-08-17 ENCOUNTER — Encounter (HOSPITAL_BASED_OUTPATIENT_CLINIC_OR_DEPARTMENT_OTHER): Payer: Self-pay | Admitting: Family Medicine

## 2021-08-17 ENCOUNTER — Ambulatory Visit (HOSPITAL_BASED_OUTPATIENT_CLINIC_OR_DEPARTMENT_OTHER): Payer: BC Managed Care – PPO | Admitting: Family Medicine

## 2021-08-17 ENCOUNTER — Other Ambulatory Visit: Payer: Self-pay

## 2021-08-17 VITALS — BP 132/70 | HR 89 | Ht 71.0 in | Wt 214.0 lb

## 2021-08-17 DIAGNOSIS — Z8546 Personal history of malignant neoplasm of prostate: Secondary | ICD-10-CM | POA: Diagnosis not present

## 2021-08-17 DIAGNOSIS — Z87891 Personal history of nicotine dependence: Secondary | ICD-10-CM

## 2021-08-17 DIAGNOSIS — Z0000 Encounter for general adult medical examination without abnormal findings: Secondary | ICD-10-CM

## 2021-08-17 NOTE — Patient Instructions (Signed)
°  Medication Instructions:  Your physician recommends that you continue on your current medications as directed. Please refer to the Current Medication list given to you today. --If you need a refill on any your medications before your next appointment, please call your pharmacy first. If no refills are authorized on file call the office.--  Follow-Up: Your next appointment:   Your physician recommends that you schedule a follow-up appointment in: 1-3 MONTHS for a CPE with Dr. de [place]  You will receive a text message or e-mail with a link to a survey about your care and experience with [place] today! We would greatly appreciate your feedback!   Thanks for letting us be apart of your health journey!!  Primary Care and Sports Medicine   Dr. Arlina Robes [place]   We encourage you to activate your patient portal called "MyChart".  Sign up information is provided on this After Visit Summary.  MyChart is used to connect with patients for Virtual Visits (Telemedicine).  Patients are able to view lab/test results, encounter notes, upcoming appointments, etc.  Non-urgent messages can be sent to your provider as well. To learn more about what you can do with MyChart, please visit --  [email or web address].

## 2021-08-17 NOTE — Assessment & Plan Note (Signed)
Continue with regular follow-up with urology for monitoring No new symptoms at this time

## 2021-08-17 NOTE — Assessment & Plan Note (Signed)
At least 20-pack-year history of smoking, reported quit date of 2017 At wellness exam, will discuss recommended screening guidelines

## 2021-08-17 NOTE — Progress Notes (Signed)
New Patient Office Visit  Subjective:  Patient ID: Andre Cowan, male    DOB: 07/29/1956  Age: 66 y.o. MRN: [id number]  CC:  Chief Complaint  Patient presents with   Establish Care    Former PCP with [facility] - Notes in [place]. Patient has no specific concerns or complaints today. He would just like to have a baseline    HPI Andre Cowan is a 66 year old male presenting to establish in clinic.  He has current concerns as outlined above.  Past medical history significant for prostate cancer, tobacco use.  Prostate cancer: Follows with Dr. Tresa Moore at [facility] urology.  Was seeing Dr. Tammi Klippel for oncology.  Last time seeing oncologist was at the end of radiation about 2 years ago.  Patient continues to follow with urologist regularly.  Tobacco use: Currently smokes about 0.5-1 pack/day, did so for about 40 years.  Quit in 2017.  Patient is originally from [place], [place].  He has lived here for 29 years now.  He works as a Theatre manager.  Outside of work, patient enjoys walking, Buyer, retail, yard work  Past Medical History:  Diagnosis Date   Headache    Migraines   Prostate cancer [facility])     Past Surgical History:  Procedure Laterality Date   CERVICAL DISCECTOMY     CYST EXCISION  1976   Spine   LYMPHADENECTOMY Bilateral 04/05/2018   Procedure: LYMPHADENECTOMY;  Surgeon: Alexis Frock, MD;  Location: [facility] ORS;  Service: Urology;  Laterality: Bilateral;   PROSTATE BIOPSY     ROBOT ASSISTED LAPAROSCOPIC RADICAL PROSTATECTOMY N/A 04/05/2018   Procedure: XI ROBOTIC ASSISTED LAPAROSCOPIC RADICAL PROSTATECTOMY;  Surgeon: Alexis Frock, MD;  Location: [facility] OR[facility];  Service: Urology;  Laterality: N/A;   TONSILLECTOMY      Family History  Problem Relation Age of Onset   Hypertension Father    Heart disease Father    Heart attack Father    Prostate cancer Neg Hx    Breast cancer Neg Hx    Colon cancer Neg Hx    Pancreatic cancer Neg Hx     Social History    Socioeconomic History   Marital status: Married    Spouse name: Not on file   Number of children: 3   Years of education: Not on file   Highest education level: Not on file  Occupational History    Comment: working full time   Tobacco Use   Smoking status: Former    Packs/day: 0.50    Years: 40.00    Pack years: 20.00    Types: Cigarettes    Quit date: 09/15/2015    Years since quitting: 5.9   Smokeless tobacco: Never  Vaping Use   Vaping Use: Never used  Substance and Sexual Activity   Alcohol use: Never   Drug use: Never   Sexual activity: Not Currently  Other Topics Concern   Not on file  Social History Narrative   Not on file   Social Determinants of Health   Financial Resource Strain: Not on file  Food Insecurity: Not on file  Transportation Needs: Not on file  Physical Activity: Not on file  Stress: Not on file  Social Connections: Not on file  Intimate Partner Violence: Not on file    Objective:   Today's Vitals: BP 132/70    Pulse 89    Ht 5\' 11"  (1.803 m)    Wt 214 lb (97.1 kg)    SpO2 98%    BMI  29.85 kg/m   Physical Exam  66 year old male in no acute distress Cardiovascular exam with regular rate and rhythm, no murmur appreciated Lungs clear to auscultation bilaterally  Assessment & Plan:   Problem List Items Addressed This Visit       Other   History of prostate cancer    Continue with regular follow-up with urology for monitoring No new symptoms at this time      History of tobacco use - Primary    At least 20-pack-year history of smoking, reported quit date of 2017 At wellness exam, will discuss recommended screening guidelines      Other Visit Diagnoses     Wellness examination       Relevant Orders   CBC with Differential/Platelet   Comprehensive metabolic panel   Lipid panel   TSH Rfx on Abnormal to Free T4   Hemoglobin A1c       Outpatient Encounter Medications as of 08/17/2021  Medication Sig   [DISCONTINUED]  mirabegron ER (MYRBETRIQ) 25 MG TB24 tablet Take 1 tablet (25 mg total) by mouth daily.   [DISCONTINUED] Trospium Chloride 60 MG CP24 TAKE 1 CAPSULE (60 MG TOTAL) BY MOUTH DAILY AFTER SUPPER.   No facility-administered encounter medications on file as of 08/17/2021.    Follow-up: Return in about 3 months (around 11/15/2021) for Follow Up.   Naoma Boxell J De [place], MD

## 2021-11-07 ENCOUNTER — Ambulatory Visit (HOSPITAL_BASED_OUTPATIENT_CLINIC_OR_DEPARTMENT_OTHER): Payer: BC Managed Care – PPO

## 2021-11-07 ENCOUNTER — Other Ambulatory Visit: Payer: Self-pay

## 2021-11-07 DIAGNOSIS — Z0000 Encounter for general adult medical examination without abnormal findings: Secondary | ICD-10-CM

## 2021-11-08 LAB — LIPID PANEL
Chol/HDL Ratio: 5.1 ratio — ABNORMAL HIGH (ref 0.0–5.0)
Cholesterol, Total: 193 mg/dL (ref 100–199)
HDL: 38 mg/dL — ABNORMAL LOW (ref >39)
LDL Chol Calc (NIH): 118 mg/dL — ABNORMAL HIGH (ref 0–99)
Triglycerides: 207 mg/dL — ABNORMAL HIGH (ref 0–149)
VLDL Cholesterol Cal: 37 mg/dL (ref 5–40)

## 2021-11-08 LAB — COMPREHENSIVE METABOLIC PANEL
ALT: 19 IU/L (ref 0–44)
AST: 19 IU/L (ref 0–40)
Albumin/Globulin Ratio: 1.8 (ref 1.2–2.2)
Albumin: 4.1 g/dL (ref 3.8–4.8)
Alkaline Phosphatase: 68 IU/L (ref 44–121)
BUN/Creatinine Ratio: 11 (ref 10–24)
BUN: 12 mg/dL (ref 8–27)
Bilirubin Total: 0.5 mg/dL (ref 0.0–1.2)
CO2: 22 mmol/L (ref 20–29)
Calcium: 9.6 mg/dL (ref 8.6–10.2)
Chloride: 100 mmol/L (ref 96–106)
Creatinine, Ser: 1.08 mg/dL (ref 0.76–1.27)
Globulin, Total: 2.3 g/dL (ref 1.5–4.5)
Glucose: 113 mg/dL — ABNORMAL HIGH (ref 70–99)
Potassium: 4.1 mmol/L (ref 3.5–5.2)
Sodium: 136 mmol/L (ref 134–144)
Total Protein: 6.4 g/dL (ref 6.0–8.5)
eGFR: 76 mL/min/{1.73_m2} (ref >59)

## 2021-11-08 LAB — CBC WITH DIFFERENTIAL/PLATELET
Basophils Absolute: 0.1 10*3/uL (ref 0.0–0.2)
Basos: 1 %
EOS (ABSOLUTE): 0.1 10*3/uL (ref 0.0–0.4)
Eos: 1 %
Hematocrit: 39.5 % (ref 37.5–51.0)
Hemoglobin: 13.6 g/dL (ref 13.0–17.7)
Immature Grans (Abs): 0 10*3/uL (ref 0.0–0.1)
Immature Granulocytes: 0 %
Lymphocytes Absolute: 1.7 10*3/uL (ref 0.7–3.1)
Lymphs: 26 %
MCH: 32.1 pg (ref 26.6–33.0)
MCHC: 34.4 g/dL (ref 31.5–35.7)
MCV: 93 fL (ref 79–97)
Monocytes Absolute: 0.6 10*3/uL (ref 0.1–0.9)
Monocytes: 10 %
Neutrophils Absolute: 3.9 10*3/uL (ref 1.4–7.0)
Neutrophils: 62 %
Platelets: 311 10*3/uL (ref 150–450)
RBC: 4.24 x10E6/uL (ref 4.14–5.80)
RDW: 12 % (ref 11.6–15.4)
WBC: 6.4 10*3/uL (ref 3.4–10.8)

## 2021-11-08 LAB — TSH RFX ON ABNORMAL TO FREE T4: TSH: 2.22 u[IU]/mL (ref 0.450–4.500)

## 2021-11-08 LAB — HEMOGLOBIN A1C
Est. average glucose Bld gHb Est-mCnc: 128 mg/dL
Hgb A1c MFr Bld: 6.1 % — ABNORMAL HIGH (ref 4.8–5.6)

## 2021-11-14 ENCOUNTER — Ambulatory Visit (INDEPENDENT_AMBULATORY_CARE_PROVIDER_SITE_OTHER): Payer: BC Managed Care – PPO | Admitting: Family Medicine

## 2021-11-14 ENCOUNTER — Encounter (HOSPITAL_BASED_OUTPATIENT_CLINIC_OR_DEPARTMENT_OTHER): Payer: Self-pay | Admitting: Family Medicine

## 2021-11-14 VITALS — BP 128/82 | HR 86 | Temp 98.7°F | Ht 71.0 in | Wt 213.9 lb

## 2021-11-14 DIAGNOSIS — Z0000 Encounter for general adult medical examination without abnormal findings: Secondary | ICD-10-CM

## 2021-11-14 DIAGNOSIS — E785 Hyperlipidemia, unspecified: Secondary | ICD-10-CM

## 2021-11-14 DIAGNOSIS — R7303 Prediabetes: Secondary | ICD-10-CM

## 2021-11-14 DIAGNOSIS — Z23 Encounter for immunization: Secondary | ICD-10-CM

## 2021-11-14 MED ORDER — SHINGRIX 50 MCG/0.5ML IM SUSR: 0.5000 mL | Freq: Once | INTRAMUSCULAR | 0 refills | Status: AC

## 2021-11-14 NOTE — Progress Notes (Signed)
?Subjective:   ? ?CC: Annual Physical Exam ? ?HPI:  ?Andre Cowan is a 66 y.o. presenting for annual physical ? ?I reviewed the past medical history, family history, social history, surgical history, and allergies today and no changes were needed.  Please see the problem list section below in epic for further details. ? ?Past Medical History: ?Past Medical History:  ?Diagnosis Date  ? Headache   ? Migraines  ? Prostate cancer ([place])   ? ?Past Surgical History: ?Past Surgical History:  ?Procedure Laterality Date  ? CERVICAL DISCECTOMY    ? CYST EXCISION  1976  ? Spine  ? LYMPHADENECTOMY Bilateral 04/05/2018  ? Procedure: LYMPHADENECTOMY;  Surgeon: Alexis Frock, [place];  Location: [facility];  Service: Urology;  Laterality: Bilateral;  ? PROSTATE BIOPSY    ? ROBOT ASSISTED LAPAROSCOPIC RADICAL PROSTATECTOMY N/A 04/05/2018  ? Procedure: XI ROBOTIC ASSISTED LAPAROSCOPIC RADICAL PROSTATECTOMY;  Surgeon: Alexis Frock, [place];  Location: [facility];  Service: Urology;  Laterality: N/A;  ? TONSILLECTOMY    ? ?Social History: ?Social History  ? ?Socioeconomic History  ? Marital status: Married  ?  Spouse name: Not on file  ? Number of children: 3  ? Years of education: Not on file  ? Highest education level: Not on file  ?Occupational History  ?  Comment: working full time   ?Tobacco Use  ? Smoking status: Some Days  ?  Packs/day: 0.50  ?  Years: 40.00  ?  Pack years: 20.00  ?  Types: Cigarettes  ?  Last attempt to quit: 09/15/2015  ?  Years since quitting: 6.1  ? Smokeless tobacco: Never  ?Vaping Use  ? Vaping Use: Never used  ?Substance and Sexual Activity  ? Alcohol use: Never  ? Drug use: Never  ? Sexual activity: Not Currently  ?Other Topics Concern  ? Not on file  ?Social History Narrative  ? Not on file  ? ?Social Determinants of Health  ? ?Financial Resource Strain: Not on file  ?Food Insecurity: Not on file  ?Transportation Needs: Not on file  ?Physical Activity: Not on file  ?Stress: Not on file  ?Social Connections: Not on  file  ? ?Family History: ?Family History  ?Problem Relation Age of Onset  ? Hypertension Father   ? Heart disease Father   ? Heart attack Father   ? Prostate cancer Neg Hx   ? Breast cancer Neg Hx   ? Colon cancer Neg Hx   ? Pancreatic cancer Neg Hx   ? ?Allergies: ?No Known Allergies ?Medications: See med rec. ? ?Review of Systems: No headache, visual changes, nausea, vomiting, diarrhea, constipation, dizziness, abdominal pain, skin rash, fevers, chills, night sweats, swollen lymph nodes, weight loss, chest pain, body aches, joint swelling, muscle aches, shortness of breath, mood changes, visual or auditory hallucinations. ? ?Objective:   ? ?BP 128/82   Pulse 86   Temp 98.7 ?F (37.1 ?C)   Ht '5\' 11"'$  (1.803 m)   Wt 213 lb 14.4 oz (97 kg)   SpO2 97%   BMI 29.83 kg/m?  ? ?General: Well Developed, well nourished, and in no acute distress.  ?Neuro: Alert and oriented x3, extra-ocular muscles intact, sensation grossly intact. Cranial nerves II through XII are intact, motor, sensory, and coordinative functions are all intact. ?HEENT: Normocephalic, atraumatic, pupils equal round reactive to light, neck supple, no masses, no lymphadenopathy, thyroid nonpalpable. Oropharynx, nasopharynx, external ear canals are unremarkable. ?Skin: Warm and dry, no rashes noted.  ?Cardiac: Regular rate and rhythm,  no murmurs rubs or gallops.  ?Respiratory: Clear to auscultation bilaterally. Not using accessory muscles, speaking in full sentences.  ?Abdominal: Soft, nontender, nondistended, positive bowel sounds, no masses, no organomegaly.  ?Musculoskeletal: Shoulder, elbow, wrist, hip, knee, ankle stable, and with full range of motion. ? ?Impression and Recommendations:   ? ?Wellness examination ?Routine HCM labs reviewed. HCM reviewed/discussed. Anticipatory guidance regarding healthy weight, lifestyle and choices given. ?Recommend healthy diet.  Recommend approximately 150 minutes/week of moderate intensity exercise ?Recommend  regular dental and vision exams ?Always use seatbelt/lap and shoulder restraints ?Recommend using smoke alarms and checking batteries at least twice a year ?Recommend using sunscreen when outside. ?Discussed colon cancer screening recommendations, options.  Patient will consider and let us know how he would like to proceed ?Discussed recommendations for shingles vaccine.  Patient amenable, prescription sent to pharmacy ?Discussed tetanus immunization recommendations - thinks it's been 7 years since last booster, updated HM, will revisit when due ?Given tobacco use, discussed recommendation for lung cancer screening, he wishes to hold off for now ? ?Dyslipidemia ?Noted on recent labs, ASCVD risk score 15% - discussed this result and meaning of this ?He would prefer to hold off on statin, would prefer proceeding with lifestyle modifications, rechecking lipid panel in 6 months ? ?Prediabetes ?Noted on recent labs ?Patient to proceed with lifestyle modifications ?Recheck A1c in 6 months ? ?Follow-up in 6 months or sooner as needed ? ? ?___________________________________________ ?Monita Swier de [place], MD, ABFM, CAQSM ?Primary Care and Sports Medicine ?[place] ?

## 2021-11-14 NOTE — Assessment & Plan Note (Addendum)
Routine HCM labs reviewed. HCM reviewed/discussed. Anticipatory guidance regarding healthy weight, lifestyle and choices given. ?Recommend healthy diet.  Recommend approximately 150 minutes/week of moderate intensity exercise ?Recommend regular dental and vision exams ?Always use seatbelt/lap and shoulder restraints ?Recommend using smoke alarms and checking batteries at least twice a year ?Recommend using sunscreen when outside. ?Discussed colon cancer screening recommendations, options.  Patient will consider and let us know how he would like to proceed ?Discussed recommendations for shingles vaccine.  Patient amenable, prescription sent to pharmacy ?Discussed tetanus immunization recommendations - thinks it's been 7 years since last booster, updated HM, will revisit when due ?Given tobacco use, discussed recommendation for lung cancer screening, he wishes to hold off for now ?

## 2021-11-14 NOTE — Patient Instructions (Signed)
?  Medication Instructions:  ?Your physician recommends that you continue on your current medications as directed. Please refer to the Current Medication list given to you today. ?--If you need a refill on any your medications before your next appointment, please call your pharmacy first. If no refills are authorized on file call the office.-- ? ? ? ?Follow-Up: ?Your next appointment:   ?Your physician recommends that you schedule a follow-up appointment in: 6 month follow up with Dr. de [place] ( labs one week prior to appt for labs) ? ?You will receive a text message or e-mail with a link to a survey about your care and experience with [place] today! We would greatly appreciate your feedback!  ? ?Thanks for letting us be apart of your health journey!!  ?Primary Care and Sports Medicine  ? ?Dr. Kyung Rudd de [place]  ? ?We encourage you to activate your patient portal called "MyChart".  Sign up information is provided on this After Visit Summary.  MyChart is used to connect with patients for Virtual Visits (Telemedicine).  Patients are able to view lab/test results, encounter notes, upcoming appointments, etc.  Non-urgent messages can be sent to your provider as well. To learn more about what you can do with MyChart, please visit --  [email or web address].   ? ?

## 2021-11-14 NOTE — Assessment & Plan Note (Addendum)
Noted on recent labs ?Patient to proceed with lifestyle modifications ?Recheck A1c in 6 months ?

## 2021-11-14 NOTE — Assessment & Plan Note (Signed)
Noted on recent labs, ASCVD risk score 15% - discussed this result and meaning of this ?He would prefer to hold off on statin, would prefer proceeding with lifestyle modifications, rechecking lipid panel in 6 months ?

## 2021-12-09 ENCOUNTER — Emergency Department (HOSPITAL_BASED_OUTPATIENT_CLINIC_OR_DEPARTMENT_OTHER)
Admission: EM | Admit: 2021-12-09 | Discharge: 2021-12-09 | Disposition: A | Discharge status: Home / Self Care | Payer: BC Managed Care – PPO | Attending: Emergency Medicine | Admitting: Emergency Medicine

## 2021-12-09 ENCOUNTER — Encounter (HOSPITAL_BASED_OUTPATIENT_CLINIC_OR_DEPARTMENT_OTHER): Payer: Self-pay | Admitting: Emergency Medicine

## 2021-12-09 ENCOUNTER — Other Ambulatory Visit: Payer: Self-pay

## 2021-12-09 ENCOUNTER — Ambulatory Visit
Admission: EM | Admit: 2021-12-09 | Discharge: 2021-12-09 | Disposition: A | Discharge status: Home / Self Care | Payer: BC Managed Care – PPO

## 2021-12-09 ENCOUNTER — Emergency Department (HOSPITAL_BASED_OUTPATIENT_CLINIC_OR_DEPARTMENT_OTHER): Payer: BC Managed Care – PPO

## 2021-12-09 DIAGNOSIS — Z8546 Personal history of malignant neoplasm of prostate: Secondary | ICD-10-CM | POA: Diagnosis not present

## 2021-12-09 DIAGNOSIS — L03221 Cellulitis of neck: Secondary | ICD-10-CM

## 2021-12-09 DIAGNOSIS — W57XXXA Bitten or stung by nonvenomous insect and other nonvenomous arthropods, initial encounter: Secondary | ICD-10-CM | POA: Insufficient documentation

## 2021-12-09 DIAGNOSIS — R221 Localized swelling, mass and lump, neck: Secondary | ICD-10-CM

## 2021-12-09 LAB — COMPREHENSIVE METABOLIC PANEL
ALT: 19 U/L (ref 0–44)
AST: 15 U/L (ref 15–41)
Albumin: 4.1 g/dL (ref 3.5–5.0)
Alkaline Phosphatase: 52 U/L (ref 38–126)
Anion gap: 10 (ref 5–15)
BUN: 18 mg/dL (ref 8–23)
CO2: 23 mmol/L (ref 22–32)
Calcium: 10.1 mg/dL (ref 8.9–10.3)
Chloride: 103 mmol/L (ref 98–111)
Creatinine, Ser: 0.8 mg/dL (ref 0.61–1.24)
GFR, Estimated: >60 mL/min (ref >60)
Glucose, Bld: 106 mg/dL — ABNORMAL HIGH (ref 70–99)
Potassium: 3.8 mmol/L (ref 3.5–5.1)
Sodium: 136 mmol/L (ref 135–145)
Total Bilirubin: 0.3 mg/dL (ref 0.3–1.2)
Total Protein: 7 g/dL (ref 6.5–8.1)

## 2021-12-09 LAB — CBC WITH DIFFERENTIAL/PLATELET
Abs Immature Granulocytes: 0.03 10*3/uL (ref 0.00–0.07)
Basophils Absolute: 0.1 10*3/uL (ref 0.0–0.1)
Basophils Relative: 1 %
Eosinophils Absolute: 0.3 10*3/uL (ref 0.0–0.5)
Eosinophils Relative: 3 %
HCT: 40.8 % (ref 39.0–52.0)
Hemoglobin: 13.5 g/dL (ref 13.0–17.0)
Immature Granulocytes: 0 %
Lymphocytes Relative: 20 %
Lymphs Abs: 2 10*3/uL (ref 0.7–4.0)
MCH: 31 pg (ref 26.0–34.0)
MCHC: 33.1 g/dL (ref 30.0–36.0)
MCV: 93.6 fL (ref 80.0–100.0)
Monocytes Absolute: 0.7 10*3/uL (ref 0.1–1.0)
Monocytes Relative: 7 %
Neutro Abs: 7.3 10*3/uL (ref 1.7–7.7)
Neutrophils Relative %: 69 %
Platelets: 342 10*3/uL (ref 150–400)
RBC: 4.36 MIL/uL (ref 4.22–5.81)
RDW: 12.9 % (ref 11.5–15.5)
WBC: 10.5 10*3/uL (ref 4.0–10.5)
nRBC: 0 % (ref 0.0–0.2)

## 2021-12-09 LAB — LACTIC ACID, PLASMA: Lactic Acid, Venous: 1.3 mmol/L (ref 0.5–1.9)

## 2021-12-09 MED ORDER — SODIUM CHLORIDE 0.9 % IV SOLN: 2.0000 g | Freq: Three times a day (TID) | INTRAVENOUS | Status: DC

## 2021-12-09 MED ORDER — LACTATED RINGERS IV BOLUS
1000.0000 mL | Freq: Once | INTRAVENOUS | Status: AC
  Administered 2021-12-09: 1000 mL via INTRAVENOUS

## 2021-12-09 MED ORDER — DOXYCYCLINE HYCLATE 100 MG PO TABS
100.0000 mg | ORAL_TABLET | Freq: Once | ORAL | Status: AC
  Administered 2021-12-09: 100 mg via ORAL
  Filled 2021-12-09: qty 1

## 2021-12-09 MED ORDER — IOHEXOL 300 MG/ML  SOLN
100.0000 mL | Freq: Once | INTRAMUSCULAR | Status: AC | PRN
  Administered 2021-12-09: 75 mL via INTRAVENOUS

## 2021-12-09 MED ORDER — CEPHALEXIN 500 MG PO CAPS: 500.0000 mg | ORAL_CAPSULE | Freq: Three times a day (TID) | ORAL | 0 refills | Status: AC

## 2021-12-09 MED ORDER — CLINDAMYCIN HCL 150 MG PO CAPS: 300.0000 mg | ORAL_CAPSULE | Freq: Four times a day (QID) | ORAL | 0 refills | Status: AC

## 2021-12-09 MED ORDER — SODIUM CHLORIDE 0.9 % IV SOLN
2.0000 g | Freq: Once | INTRAVENOUS | Status: AC
  Administered 2021-12-09: 2 g via INTRAVENOUS
  Filled 2021-12-09: qty 12.5

## 2021-12-09 NOTE — ED Provider Notes (Signed)
Patient presents to urgent care today with a chief complaint of an area of redness and swelling on the front of his throat overlying his Adams apple his throat that has significantly increased in size since he first noticed it yesterday.  Patient states initially the area was about the size of a lima bean.  States that overnight it grew from that small size to the size of a small plum.  Patient states he is also noticed today that his voice has become hoarse, he has had more difficulty swallowing.  Patient denies airway compromise.  Patient states he is not taking any medications for this such as Benadryl despite being concerned that it was an insect bite.  Patient advised that because of the location of the nodule I recommend that he have ultrasound to evaluate for possible abscess and involvement of surrounding structures, particularly in light of his complaint of difficulty swallowing and hoarseness of voice.  Patient was agreeable to going to the emergency room now for further evaluation. ?  ?Lynden Oxford Scales, PA-C ?12/09/21 1609 ? ?

## 2021-12-09 NOTE — ED Provider Notes (Signed)
?[facility] EMERGENCY DEPT ?Provider Note ? ? ?CSN: [id number] ?Arrival date & time: 12/09/21  1620 ? ?  ? ?History ? ?Chief Complaint  ?Patient presents with  ? Cyst  ? Insect Bite  ? ? ?Andre Cowan is a 66 y.o. male. ? ?HPI ? ?  ? ?66yo male with history of prostate cancer and migraines presents with concern for anterior neck swelling and redness. ? ?Belives he may have been bit by a bug-started off a s a small bump and in the last 2 days it has increased signfiicantly. Area very painful, is also itchy.  Pain to the area when swallowing. No drooling, dysphonia, dyspnea. No fevers, chills, n/v/fatigue. Otherwise feeling well.   ? ?Past Medical History:  ?Diagnosis Date  ? Headache   ? Migraines  ? Prostate cancer [facility])   ?  ? ?Home Medications ?Prior to Admission medications   ?Medication Sig Start Date End Date Taking? Authorizing Provider  ?cephALEXin (KEFLEX) 500 MG capsule Take 1 capsule (500 mg total) by mouth 3 (three) times daily for 10 days. 12/09/21 12/19/21 Yes Gareth Morgan, MD  ?clindamycin (CLEOCIN) 150 MG capsule Take 2 capsules (300 mg total) by mouth 4 (four) times daily for 7 days. 12/09/21 12/16/21 Yes Gareth Morgan, MD  ?   ? ?Allergies    ?Patient has no known allergies.   ? ?Review of Systems   ?Review of Systems ? ?Physical Exam ?Updated Vital Signs ?BP 121/78   Pulse 78   Temp 98.4 ?F (36.9 ?C) (Oral)   Resp 16   Ht '5\' 11"'$  (1.803 m)   Wt 93.4 kg   SpO2 95%   BMI 28.73 kg/m?  ?Physical Exam ?Vitals and nursing note reviewed.  ?Constitutional:   ?   General: He is not in acute distress. ?   Appearance: He is well-developed. He is not diaphoretic.  ?HENT:  ?   Head: Normocephalic and atraumatic.  ?Eyes:  ?   Conjunctiva/sclera: Conjunctivae normal.  ?Neck:  ?   Comments: No stridor. ?Erythema to the anterior neck extending down towards chest, significant induration in 3x3 area overlying thyroid cartilage ?Cardiovascular:  ?   Rate and Rhythm: Normal rate and regular  rhythm.  ?   Heart sounds: Normal heart sounds. No murmur heard. ?  No friction rub. No gallop.  ?Pulmonary:  ?   Effort: Pulmonary effort is normal. No respiratory distress.  ?   Breath sounds: Normal breath sounds. No wheezing or rales.  ?Abdominal:  ?   General: There is no distension.  ?   Palpations: Abdomen is soft.  ?   Tenderness: There is no abdominal tenderness. There is no guarding.  ?Musculoskeletal:  ?   Cervical back: Normal range of motion.  ?Skin: ?   General: Skin is warm and dry.  ?Neurological:  ?   Mental Status: He is alert and oriented to person, place, and time.  ? ? ?ED Results / Procedures / Treatments   ?Labs ?(all labs ordered are listed, but only abnormal results are displayed) ?Labs Reviewed  ?COMPREHENSIVE METABOLIC PANEL - Abnormal; Notable for the following components:  ?    Result Value  ? Glucose, Bld 106 (*)   ? All other components within normal limits  ?CULTURE, BLOOD (ROUTINE X 2)  ?CULTURE, BLOOD (ROUTINE X 2)  ?CBC WITH DIFFERENTIAL/PLATELET  ?LACTIC ACID, PLASMA  ? ? ?EKG ?None ? ?Radiology ?CT Soft Tissue Neck W Contrast ? ?Result Date: 12/09/2021 ?CLINICAL DATA:  Soft tissue swelling  cyst/abscess/bug bite 2 days ago EXAM: CT NECK WITH CONTRAST TECHNIQUE: Multidetector CT imaging of the neck was performed using the standard protocol following the bolus administration of intravenous contrast. RADIATION DOSE REDUCTION: This exam was performed according to the departmental dose-optimization program which includes automated exposure control, adjustment of the mA and/or kV according to patient size and/or use of iterative reconstruction technique. CONTRAST:  36m OMNIPAQUE IOHEXOL 300 MG/ML  SOLN COMPARISON:  None. FINDINGS: Pharynx and larynx: Normal. No mass or swelling. Salivary glands: No inflammation, mass, or stone. Thyroid: Normal. Lymph nodes: None enlarged or abnormal density. Vascular: Patent. Limited intracranial: Negative. Visualized orbits: Negative. Mastoids and  visualized paranasal sinuses: Clear. Skeleton: Status post ACDF C6-C7.  No acute osseous abnormality. Upper chest: No focal pulmonary opacity or pleural effusion. Centrilobular and paraseptal emphysema. Aortic atherosclerosis. Other: In the soft tissues of the midline anterior neck anterior to the thyroid cartilage, there is skin thickening (series 3, image 108), with fat stranding and inflammatory changes in the underlying platysma, but no focal collection to suggest cyst or abscess. IMPRESSION: 1. Superficial inflammatory changes in the midline neck anterior to the thyroid cartilage, without evidence of focal collection to suggest cyst, phlegmon, or abscess. 2. No other acute process in the neck. 3. Aortic Atherosclerosis (ICD10-I70.0) and Emphysema (ICD10-J43.9). Electronically Signed   By: AMerilyn BabaM.D.   On: 12/09/2021 20:30   ? ?Procedures ?Procedures  ? ? ?Medications Ordered in ED ?Medications  ?lactated ringers bolus 1,000 mL (0 mLs Intravenous Stopped 12/09/21 1915)  ?ceFEPIme (MAXIPIME) 2 g in sodium chloride 0.9 % 100 mL IVPB (0 g Intravenous Stopped 12/09/21 1820)  ?iohexol (OMNIPAQUE) 300 MG/ML solution 100 mL (75 mLs Intravenous Contrast Given 12/09/21 1906)  ?doxycycline (VIBRA-TABS) tablet 100 mg (100 mg Oral Given 12/09/21 2207)  ? ? ?ED Course/ Medical Decision Making/ A&P ?  ?                        ?Medical Decision Making ?Amount and/or Complexity of Data Reviewed ?Labs: ordered. ?Radiology: ordered. ? ?Risk ?Prescription drug management. ? ? ?89yomale with history of prostate cancer and migraines presents with concern for anterior neck swelling and redness. ? ?Exam with cellulitis and induration to neck anterior to thyroid cartilage.  Blood cx, labs sent and personally evaluated by me show no leukocytosis, no lactic acidosis or signs of sepsis.   ? ?CT completed to evaluate for abscess or extension of infection shows inflammation to anterior neck without signs of abscess or extension.  Bedside [place] without collection to drain.  Given vancomycin and cefepime in the ED.  Given location and rapid development of the area of cellulitis, discussed possible admission with patient. Prefers to go home on abx with strict return precautions. Given number for ENT for follow up given location and possibility of early developing abscess.  Patient discharged in stable condition with understanding of reasons to return.  ? ? ? ? ? ? ? ? ?Final Clinical Impression(s) / ED Diagnoses ?Final diagnoses:  ?Cellulitis, neck  ? ? ?Rx / DC Orders ?ED Discharge Orders   ? ?      Ordered  ?  cephALEXin (KEFLEX) 500 MG capsule  3 times daily       ? 12/09/21 2200  ?  clindamycin (CLEOCIN) 150 MG capsule  4 times daily       ? 12/09/21 2200  ? ?  ?  ? ?  ? ? ?  ?  Gareth Morgan, MD ?12/10/21 1227 ? ?

## 2021-12-09 NOTE — Progress Notes (Signed)
Pharmacy Antibiotic Note ? ?Andre Cowan is a 66 y.o. male admitted on 12/09/2021 with  wound infection .  Pharmacy has been consulted for cefepime dosing. ? ?WBC wnl, SCr wnl ? ?Plan: ?-Cefepime 2 gm IV Q 8 hours ?-Monitor CBC, renal fx, cultures and clinical progress ? ?Height: '5\' 11"'$  (180.3 cm) ?Weight: 93.4 kg (206 lb) ?IBW/kg (Calculated) : 75.3 ? ?Temp (24hrs), Avg:98.8 ?F (37.1 ?C), Min:98.7 ?F (37.1 ?C), Max:98.8 ?F (37.1 ?C) ? ?No results for input(s): WBC, CREATININE, LATICACIDVEN, VANCOTROUGH, VANCOPEAK, VANCORANDOM, GENTTROUGH, GENTPEAK, GENTRANDOM, TOBRATROUGH, TOBRAPEAK, TOBRARND, AMIKACINPEAK, AMIKACINTROU, AMIKACIN in the last 168 hours.  ?CrCl cannot be calculated (Patient's most recent lab result is older than the maximum 21 days allowed.).   ? ?No Known Allergies ? ?Antimicrobials this admission: ?Cefepime 4/28 >>  ? ?Dose adjustments this admission: ? ? ?Microbiology results: ?4/28 BCx >> ? ?Thank you for allowing pharmacy to be a part of this patient?s care. ? ?Albertina Parr, PharmD., BCCCP ?Clinical Pharmacist ?Please refer to [facility] for unit-specific pharmacist  ? ?

## 2021-12-09 NOTE — ED Triage Notes (Addendum)
Pt c/o possible bug bite, he states he has soreness and swelling to the area. Yesterday is when he noticed the swelling to his throat. Patient does not display signs/ symptoms of respiratory distress.   ?

## 2021-12-09 NOTE — ED Notes (Signed)
Pt updated NPO status at this time per attending.  ?

## 2021-12-09 NOTE — ED Triage Notes (Signed)
Pt arrives to ED with c/o cyst/abscess/bug bite to throat x2 days ago. He reports swollen area on his neck started as a small bump but now has increased drastically in size. He reports the area is itchy and painful. Associated symptoms include difficulty swallowing.  ?

## 2021-12-09 NOTE — ED Notes (Signed)
Pt agreeable with d/c plan as discussed by provider- this nurse has verbally reinforced d/c instructions and provided pt with written copy- pt acknowledges verbal understanding and denies any additional questions, concerns, neds.   ?

## 2021-12-09 NOTE — ED Notes (Signed)
Late entry -- Pt to and from CT dept via w/c -- no awake and alert lying in bed  - reports tenderness to anterior neck region and mid upper chest wall but denies need for pain med at this time.  No stridor, gurgling noted with ausculation with pt reporting still some painful swallowing.  RR even and unlabored on RA with symmetrical rise and fall of chest.   S1 and S2 regular.  Skin warm dry and intact.  Pt updated that CT results still pending-- requesting water; no other needs verbalized -- will clear with provider if ok for pt to drink water.  Pt agreeable with plan; this nurse to continue to monitor for acute changes and maintain plan of care  ?

## 2021-12-09 NOTE — ED Notes (Signed)
Patient is being discharged from the Urgent Care and sent to the Emergency Department via [facility]. Per L. Morgan-Scales PA-C ?, patient is in need of higher level of care due to need for further evaluation. Patient is aware and verbalizes understanding of plan of care.  ?Vitals:  ? 12/09/21 1601  ?BP: 124/76  ?Pulse: 88  ?Resp: 18  ?Temp: 98.8 ?F (37.1 ?C)  ?SpO2: 94%  ?  ?

## 2021-12-13 ENCOUNTER — Encounter (HOSPITAL_BASED_OUTPATIENT_CLINIC_OR_DEPARTMENT_OTHER): Payer: Self-pay | Admitting: Family Medicine

## 2021-12-13 ENCOUNTER — Ambulatory Visit (HOSPITAL_BASED_OUTPATIENT_CLINIC_OR_DEPARTMENT_OTHER): Payer: BC Managed Care – PPO | Admitting: Family Medicine

## 2021-12-13 DIAGNOSIS — Z1283 Encounter for screening for malignant neoplasm of skin: Secondary | ICD-10-CM | POA: Insufficient documentation

## 2021-12-13 DIAGNOSIS — L03221 Cellulitis of neck: Secondary | ICD-10-CM

## 2021-12-13 HISTORY — DX: Cellulitis of neck: L03.221

## 2021-12-13 HISTORY — DX: Encounter for screening for malignant neoplasm of skin: Z12.83

## 2021-12-13 NOTE — Assessment & Plan Note (Signed)
Patient seen at the emergency department recently for cellulitis.  He reports that about a week ago he began to notice small bumps on his neck which he felt was related to a possible insect bite.  Symptoms progressed and he had some neck redness and some difficulty with swallowing and so he went to an urgent care.  They advised that he needed to go to the emergency department for further evaluation due to his symptoms.  At the emergency department, patient did have labs as well as imaging completed.  Labs were generally reassuring, imaging did show some signs of general inflammation within the soft tissue of the anterior neck without any focal abscess or cystic changes, no deep space involvement.  With these findings, patient was discharged on oral antibiotics which he has continued with including cephalexin and clindamycin.  He was given a 10-day course of antibiotics ?ED documentation indicates recommendation for follow-up with ENT, however patient reports he was never given any information for an ENT provider ?On exam today, redness is improved in comparison to picture taken by patient.  At this time, he does have some notable swelling over anterior neck, mild tenderness on palpation, no fluctuance ?Patient with what appears to been cellulitis which is responding to antibiotics.  Does have an area of swelling now concerning for possible abscess ?Given degree of symptoms, location of abscess, will arrange for evaluation with ENT, referral placed today to look to get patient in later this week ?Recommend that he continue with current antibiotics as prescribed ?

## 2021-12-13 NOTE — Assessment & Plan Note (Signed)
Requesting referral to dermatology to establish, referral placed today ?

## 2021-12-13 NOTE — Progress Notes (Signed)
? ? ?  Procedures performed today:   ? ?None. ? ?Independent interpretation of notes and tests performed by another provider:  ? ?None. ? ?Brief History, Exam, Impression, and Recommendations:   ? ?BP 126/82   Pulse 77   Ht '5\' 11"'$  (1.803 m)   Wt 214 lb 9.6 oz (97.3 kg)   SpO2 100%   BMI 29.93 kg/m?  ? ?Cellulitis of neck ?Patient seen at the emergency department recently for cellulitis.  He reports that about a week ago he began to notice small bumps on his neck which he felt was related to a possible insect bite.  Symptoms progressed and he had some neck redness and some difficulty with swallowing and so he went to an urgent care.  They advised that he needed to go to the emergency department for further evaluation due to his symptoms.  At the emergency department, patient did have labs as well as imaging completed.  Labs were generally reassuring, imaging did show some signs of general inflammation within the soft tissue of the anterior neck without any focal abscess or cystic changes, no deep space involvement.  With these findings, patient was discharged on oral antibiotics which he has continued with including cephalexin and clindamycin.  He was given a 10-day course of antibiotics ?ED documentation indicates recommendation for follow-up with ENT, however patient reports he was never given any information for an ENT provider ?On exam today, redness is improved in comparison to picture taken by patient.  At this time, he does have some notable swelling over anterior neck, mild tenderness on palpation, no fluctuance ?Patient with what appears to been cellulitis which is responding to antibiotics.  Does have an area of swelling now concerning for possible abscess ?Given degree of symptoms, location of abscess, will arrange for evaluation with ENT, referral placed today to look to get patient in later this week ?Recommend that he continue with current antibiotics as prescribed ? ?Skin cancer  screening ?Requesting referral to dermatology to establish, referral placed today ? ? ?___________________________________________ ?Clara Smolen [place], MD, ABFM, CAQSM ?Primary Care and Sports Medicine ?[place] ?

## 2021-12-14 LAB — CULTURE, BLOOD (ROUTINE X 2)
Culture: NO GROWTH
Culture: NO GROWTH
Special Requests: ADEQUATE
Special Requests: ADEQUATE

## 2021-12-26 DIAGNOSIS — C61 Malignant neoplasm of prostate: Secondary | ICD-10-CM | POA: Diagnosis not present

## 2022-01-02 DIAGNOSIS — C61 Malignant neoplasm of prostate: Secondary | ICD-10-CM | POA: Diagnosis not present

## 2022-01-02 DIAGNOSIS — N393 Stress incontinence (female) (male): Secondary | ICD-10-CM | POA: Diagnosis not present

## 2022-02-16 DIAGNOSIS — D1801 Hemangioma of skin and subcutaneous tissue: Secondary | ICD-10-CM | POA: Diagnosis not present

## 2022-02-16 DIAGNOSIS — L814 Other melanin hyperpigmentation: Secondary | ICD-10-CM | POA: Diagnosis not present

## 2022-02-16 DIAGNOSIS — L301 Dyshidrosis [pompholyx]: Secondary | ICD-10-CM | POA: Diagnosis not present

## 2022-02-16 DIAGNOSIS — L821 Other seborrheic keratosis: Secondary | ICD-10-CM | POA: Diagnosis not present

## 2022-05-08 ENCOUNTER — Ambulatory Visit (HOSPITAL_BASED_OUTPATIENT_CLINIC_OR_DEPARTMENT_OTHER): Payer: BC Managed Care – PPO

## 2022-05-08 DIAGNOSIS — E785 Hyperlipidemia, unspecified: Secondary | ICD-10-CM

## 2022-05-08 DIAGNOSIS — R7303 Prediabetes: Secondary | ICD-10-CM | POA: Diagnosis not present

## 2022-05-09 LAB — LIPID PANEL
Chol/HDL Ratio: 4.9 ratio (ref 0.0–5.0)
Cholesterol, Total: 211 mg/dL — ABNORMAL HIGH (ref 100–199)
HDL: 43 mg/dL (ref >39)
LDL Chol Calc (NIH): 133 mg/dL — ABNORMAL HIGH (ref 0–99)
Triglycerides: 197 mg/dL — ABNORMAL HIGH (ref 0–149)
VLDL Cholesterol Cal: 35 mg/dL (ref 5–40)

## 2022-05-09 LAB — HEMOGLOBIN A1C
Est. average glucose Bld gHb Est-mCnc: 126 mg/dL
Hgb A1c MFr Bld: 6 % — ABNORMAL HIGH (ref 4.8–5.6)

## 2022-05-15 ENCOUNTER — Encounter (HOSPITAL_BASED_OUTPATIENT_CLINIC_OR_DEPARTMENT_OTHER): Payer: Self-pay | Admitting: Family Medicine

## 2022-05-15 ENCOUNTER — Ambulatory Visit (HOSPITAL_BASED_OUTPATIENT_CLINIC_OR_DEPARTMENT_OTHER): Payer: BC Managed Care – PPO | Admitting: Family Medicine

## 2022-05-15 DIAGNOSIS — Z0000 Encounter for general adult medical examination without abnormal findings: Secondary | ICD-10-CM

## 2022-05-15 DIAGNOSIS — Z87891 Personal history of nicotine dependence: Secondary | ICD-10-CM | POA: Diagnosis not present

## 2022-05-15 DIAGNOSIS — Z23 Encounter for immunization: Secondary | ICD-10-CM

## 2022-05-15 DIAGNOSIS — E785 Hyperlipidemia, unspecified: Secondary | ICD-10-CM | POA: Diagnosis not present

## 2022-05-15 DIAGNOSIS — R7303 Prediabetes: Secondary | ICD-10-CM

## 2022-05-15 NOTE — Progress Notes (Signed)
    Procedures performed today:    None.  Independent interpretation of notes and tests performed by another provider:   None.  Brief History, Exam, Impression, and Recommendations:    BP 123/81   Pulse 86   Ht '5\' 11"'$  (1.803 m)   Wt 211 lb 1.6 oz (95.8 kg)   SpO2 100%   BMI 29.44 kg/m   Prediabetes Recently checked hemoglobin A1c remains stable at 6.0%.  He has been working on lifestyle modifications including diet and exercise.  Has been walking regularly.  No new issues such as polyuria or polydipsia. Recommend continuing with lifestyle modifications at this time, provided handout reviewing these as well and discussed in office Plan to recheck hemoglobin A1c with labs for physical in about 6 months  Dyslipidemia Most recent labs with slightly elevated total cholesterol and LDL.  Discussed findings with patient.  He has been making lifestyle modifications as above, has also quit smoking recently, congratulated him on this.  Again reviewed ASCVD risk score and consideration for initiating statin therapy.  He remains reluctant to begin medication and prefers to focus on lifestyle modification Discussed importance of lifestyle modifications, continuing with smoking cessation, provided handout reviewing these We will monitor labs with physical in about 6 months  History of tobacco use Since last visit, he has quit smoking, indicates quitting about 1 month ago.  With current cessation, he quit cold [place], not currently utilizing any nicotine replacement methods or medications.  Indicates that he generally has been doing well currently.  Stressed importance of tobacco cessation and positive health effects with doing so.  Discussed that if he feels he needs any assistance with process, to let us know including related to nicotine replacement methods or potential pharmacotherapy such as with Chantix.  He will continue with current cessation method and will let us know if he requires any  assistance  Return in about 6 months (around 11/14/2022) for CPE with [facility] a few days prior. Patient is due for seasonal influenza vaccine, administered today   ___________________________________________ Andre Cowan [place], MD, ABFM, CAQSM Primary Care and [place]

## 2022-05-15 NOTE — Assessment & Plan Note (Signed)
Most recent labs with slightly elevated total cholesterol and LDL.  Discussed findings with patient.  He has been making lifestyle modifications as above, has also quit smoking recently, congratulated him on this.  Again reviewed ASCVD risk score and consideration for initiating statin therapy.  He remains reluctant to begin medication and prefers to focus on lifestyle modification Discussed importance of lifestyle modifications, continuing with smoking cessation, provided handout reviewing these We will monitor labs with physical in about 6 months

## 2022-05-15 NOTE — Assessment & Plan Note (Signed)
Recently checked hemoglobin A1c remains stable at 6.0%.  He has been working on lifestyle modifications including diet and exercise.  Has been walking regularly.  No new issues such as polyuria or polydipsia. Recommend continuing with lifestyle modifications at this time, provided handout reviewing these as well and discussed in office Plan to recheck hemoglobin A1c with labs for physical in about 6 months

## 2022-05-15 NOTE — Assessment & Plan Note (Signed)
Since last visit, he has quit smoking, indicates quitting about 1 month ago.  With current cessation, he quit cold [place], not currently utilizing any nicotine replacement methods or medications.  Indicates that he generally has been doing well currently.  Stressed importance of tobacco cessation and positive health effects with doing so.  Discussed that if he feels he needs any assistance with process, to let us know including related to nicotine replacement methods or potential pharmacotherapy such as with Chantix.  He will continue with current cessation method and will let us know if he requires any assistance

## 2022-05-15 NOTE — Patient Instructions (Signed)
  Medication Instructions:  Your physician recommends that you continue on your current medications as directed. Please refer to the Current Medication list given to you today. --If you need a refill on any your medications before your next appointment, please call your pharmacy first. If no refills are authorized on file call the office.-- Lab Work: Your physician has recommended that you have lab work today: No If you have labs (blood work) drawn today and your tests are completely normal, you will receive your results via [facility] a phone call from our staff.  Please ensure you check your voicemail in the event that you authorized detailed messages to be left on a delegated number. If you have any lab test that is abnormal or we need to change your treatment, we will call you to review the results.  Referrals/Procedures/Imaging: No  Follow-Up: Your next appointment:   Your physician recommends that you schedule a follow-up appointment in: 6 months cpe and labs a few days before with Dr. Tennis Must [place].  You will receive a text message or e-mail with a link to a survey about your care and experience with [place] today! We would greatly appreciate your feedback!   Thanks for letting us be apart of your health journey!!  Primary Care and Sports Medicine   Dr. Arlina Robes [place]   We encourage you to activate your patient portal called "MyChart".  Sign up information is provided on this After Visit Summary.  MyChart is used to connect with patients for Virtual Visits (Telemedicine).  Patients are able to view lab/test results, encounter notes, upcoming appointments, etc.  Non-urgent messages can be sent to your provider as well. To learn more about what you can do with MyChart, please visit --  [email or web address].

## 2022-07-03 DIAGNOSIS — C61 Malignant neoplasm of prostate: Secondary | ICD-10-CM | POA: Diagnosis not present

## 2022-07-10 DIAGNOSIS — C775 Secondary and unspecified malignant neoplasm of intrapelvic lymph nodes: Secondary | ICD-10-CM | POA: Diagnosis not present

## 2022-07-10 DIAGNOSIS — N5231 Erectile dysfunction following radical prostatectomy: Secondary | ICD-10-CM | POA: Diagnosis not present

## 2022-07-10 DIAGNOSIS — N393 Stress incontinence (female) (male): Secondary | ICD-10-CM | POA: Diagnosis not present

## 2022-07-10 DIAGNOSIS — C61 Malignant neoplasm of prostate: Secondary | ICD-10-CM | POA: Diagnosis not present

## 2022-07-14 ENCOUNTER — Other Ambulatory Visit (HOSPITAL_COMMUNITY): Payer: Self-pay | Admitting: Urology

## 2022-07-14 DIAGNOSIS — C61 Malignant neoplasm of prostate: Secondary | ICD-10-CM

## 2022-07-25 DIAGNOSIS — C61 Malignant neoplasm of prostate: Secondary | ICD-10-CM | POA: Diagnosis not present

## 2022-07-31 ENCOUNTER — Encounter (HOSPITAL_COMMUNITY): Payer: BC Managed Care – PPO

## 2022-07-31 ENCOUNTER — Encounter (HOSPITAL_COMMUNITY)
Admission: RE | Admit: 2022-07-31 | Discharge: 2022-07-31 | Disposition: A | Discharge status: Home / Self Care | Payer: BC Managed Care – PPO | Source: Ambulatory Visit | Attending: Urology | Admitting: Urology

## 2022-07-31 ENCOUNTER — Encounter (HOSPITAL_COMMUNITY): Payer: Self-pay

## 2022-07-31 ENCOUNTER — Ambulatory Visit (HOSPITAL_COMMUNITY)
Admission: RE | Admit: 2022-07-31 | Discharge: 2022-07-31 | Disposition: A | Discharge status: Home / Self Care | Payer: BC Managed Care – PPO | Source: Ambulatory Visit | Attending: Urology | Admitting: Urology

## 2022-07-31 DIAGNOSIS — K8020 Calculus of gallbladder without cholecystitis without obstruction: Secondary | ICD-10-CM | POA: Diagnosis not present

## 2022-07-31 DIAGNOSIS — C61 Malignant neoplasm of prostate: Secondary | ICD-10-CM | POA: Diagnosis not present

## 2022-07-31 DIAGNOSIS — Z8546 Personal history of malignant neoplasm of prostate: Secondary | ICD-10-CM | POA: Diagnosis not present

## 2022-07-31 MED ORDER — TECHNETIUM TC 99M MEDRONATE IV KIT
20.0000 | PACK | Freq: Once | INTRAVENOUS | Status: AC | PRN
  Administered 2022-07-31: 20.2 via INTRAVENOUS

## 2022-09-14 DIAGNOSIS — C61 Malignant neoplasm of prostate: Secondary | ICD-10-CM | POA: Diagnosis not present

## 2022-09-14 DIAGNOSIS — C775 Secondary and unspecified malignant neoplasm of intrapelvic lymph nodes: Secondary | ICD-10-CM | POA: Diagnosis not present

## 2022-09-14 DIAGNOSIS — N393 Stress incontinence (female) (male): Secondary | ICD-10-CM | POA: Diagnosis not present

## 2022-09-14 DIAGNOSIS — N5231 Erectile dysfunction following radical prostatectomy: Secondary | ICD-10-CM | POA: Diagnosis not present

## 2022-09-14 DIAGNOSIS — R3915 Urgency of urination: Secondary | ICD-10-CM | POA: Diagnosis not present

## 2022-09-30 DIAGNOSIS — R051 Acute cough: Secondary | ICD-10-CM | POA: Diagnosis not present

## 2022-09-30 DIAGNOSIS — J029 Acute pharyngitis, unspecified: Secondary | ICD-10-CM | POA: Diagnosis not present

## 2022-09-30 DIAGNOSIS — Z20822 Contact with and (suspected) exposure to covid-19: Secondary | ICD-10-CM | POA: Diagnosis not present

## 2022-10-25 ENCOUNTER — Encounter (HOSPITAL_BASED_OUTPATIENT_CLINIC_OR_DEPARTMENT_OTHER): Payer: Self-pay

## 2022-11-07 ENCOUNTER — Ambulatory Visit (HOSPITAL_BASED_OUTPATIENT_CLINIC_OR_DEPARTMENT_OTHER): Payer: BC Managed Care – PPO

## 2022-11-07 DIAGNOSIS — Z0000 Encounter for general adult medical examination without abnormal findings: Secondary | ICD-10-CM

## 2022-11-08 LAB — CBC WITH DIFFERENTIAL/PLATELET
Basophils Absolute: 0.1 10*3/uL (ref 0.0–0.2)
Basos: 1 %
EOS (ABSOLUTE): 0.2 10*3/uL (ref 0.0–0.4)
Eos: 3 %
Hematocrit: 40.9 % (ref 37.5–51.0)
Hemoglobin: 13.9 g/dL (ref 13.0–17.7)
Immature Grans (Abs): 0 10*3/uL (ref 0.0–0.1)
Immature Granulocytes: 0 %
Lymphocytes Absolute: 2.2 10*3/uL (ref 0.7–3.1)
Lymphs: 34 %
MCH: 31.5 pg (ref 26.6–33.0)
MCHC: 34 g/dL (ref 31.5–35.7)
MCV: 93 fL (ref 79–97)
Monocytes Absolute: 0.5 10*3/uL (ref 0.1–0.9)
Monocytes: 9 %
Neutrophils Absolute: 3.4 10*3/uL (ref 1.4–7.0)
Neutrophils: 53 %
Platelets: 333 10*3/uL (ref 150–450)
RBC: 4.41 x10E6/uL (ref 4.14–5.80)
RDW: 12.6 % (ref 11.6–15.4)
WBC: 6.3 10*3/uL (ref 3.4–10.8)

## 2022-11-08 LAB — COMPREHENSIVE METABOLIC PANEL
ALT: 13 IU/L (ref 0–44)
AST: 14 IU/L (ref 0–40)
Albumin/Globulin Ratio: 1.9 (ref 1.2–2.2)
Albumin: 4.1 g/dL (ref 3.9–4.9)
Alkaline Phosphatase: 54 IU/L (ref 44–121)
BUN/Creatinine Ratio: 19 (ref 10–24)
BUN: 19 mg/dL (ref 8–27)
Bilirubin Total: 0.4 mg/dL (ref 0.0–1.2)
CO2: 20 mmol/L (ref 20–29)
Calcium: 9.4 mg/dL (ref 8.6–10.2)
Chloride: 102 mmol/L (ref 96–106)
Creatinine, Ser: 1.01 mg/dL (ref 0.76–1.27)
Globulin, Total: 2.2 g/dL (ref 1.5–4.5)
Glucose: 116 mg/dL — ABNORMAL HIGH (ref 70–99)
Potassium: 4.7 mmol/L (ref 3.5–5.2)
Sodium: 137 mmol/L (ref 134–144)
Total Protein: 6.3 g/dL (ref 6.0–8.5)
eGFR: 82 mL/min/{1.73_m2} (ref >59)

## 2022-11-08 LAB — LIPID PANEL
Chol/HDL Ratio: 4.3 ratio (ref 0.0–5.0)
Cholesterol, Total: 202 mg/dL — ABNORMAL HIGH (ref 100–199)
HDL: 47 mg/dL (ref >39)
LDL Chol Calc (NIH): 122 mg/dL — ABNORMAL HIGH (ref 0–99)
Triglycerides: 185 mg/dL — ABNORMAL HIGH (ref 0–149)
VLDL Cholesterol Cal: 33 mg/dL (ref 5–40)

## 2022-11-08 LAB — HEMOGLOBIN A1C
Est. average glucose Bld gHb Est-mCnc: 131 mg/dL
Hgb A1c MFr Bld: 6.2 % — ABNORMAL HIGH (ref 4.8–5.6)

## 2022-11-08 LAB — TSH RFX ON ABNORMAL TO FREE T4: TSH: 3.57 u[IU]/mL (ref 0.450–4.500)

## 2022-11-14 ENCOUNTER — Ambulatory Visit (INDEPENDENT_AMBULATORY_CARE_PROVIDER_SITE_OTHER): Payer: BC Managed Care – PPO | Admitting: Family Medicine

## 2022-11-14 ENCOUNTER — Encounter (HOSPITAL_BASED_OUTPATIENT_CLINIC_OR_DEPARTMENT_OTHER): Payer: Self-pay | Admitting: Family Medicine

## 2022-11-14 VITALS — BP 126/92 | HR 93 | Ht 71.0 in | Wt 217.0 lb

## 2022-11-14 DIAGNOSIS — R030 Elevated blood-pressure reading, without diagnosis of hypertension: Secondary | ICD-10-CM | POA: Diagnosis not present

## 2022-11-14 DIAGNOSIS — Z0000 Encounter for general adult medical examination without abnormal findings: Secondary | ICD-10-CM

## 2022-11-14 DIAGNOSIS — Z8546 Personal history of malignant neoplasm of prostate: Secondary | ICD-10-CM

## 2022-11-14 DIAGNOSIS — Z87891 Personal history of nicotine dependence: Secondary | ICD-10-CM

## 2022-11-14 DIAGNOSIS — E785 Hyperlipidemia, unspecified: Secondary | ICD-10-CM

## 2022-11-14 DIAGNOSIS — R7303 Prediabetes: Secondary | ICD-10-CM | POA: Diagnosis not present

## 2022-11-14 NOTE — Progress Notes (Signed)
Complete physical exam  Patient: Andre Cowan   DOB: [birth year]   67 y.o. Male  MRN: [id number]  Subjective:    Andre Cowan is a 67 y.o. male who presents today for a complete physical exam.   Depression screenings:    11/14/2022    9:34 AM 05/15/2022    8:41 AM 12/13/2021    9:38 AM  Depression screen PHQ 2/9  Decreased Interest 0 0 0  Down, Depressed, Hopeless 0 0 0  PHQ - 2 Score 0 0 0  Altered sleeping  0   Tired, decreased energy  0   Change in appetite  0   Feeling bad or failure about yourself   0   Trouble concentrating  0   Moving slowly or fidgety/restless  0   Suicidal thoughts  0   PHQ-9 Score  0   Difficult doing work/chores  Not difficult at all    Patient Care Team: de [place], Blondell Reveal, MD as PCP - General (Family Medicine) Cira Rue, RN Nurse Navigator as Registered Nurse (Medical Oncology)   Outpatient Medications Prior to Visit  Medication Sig   XTANDI 40 MG capsule Take 160 mg by mouth daily.   No facility-administered medications prior to visit.   Review of Systems  Constitutional:  Negative for malaise/fatigue.  Respiratory:  Negative for cough and shortness of breath.   Cardiovascular:  Negative for chest pain and palpitations.  Gastrointestinal:  Negative for abdominal pain, nausea and vomiting.  Genitourinary:  Negative for dysuria, frequency, hematuria and urgency.  Musculoskeletal:  Negative for myalgias.  Neurological:  Negative for dizziness, weakness and headaches.  Psychiatric/Behavioral:  Negative for depression and suicidal ideas. The patient is not nervous/anxious.       Objective:     BP (!) 126/92 Comment: Repeat BP  Pulse 93   Ht 5\' 11"  (1.803 m)   Wt 217 lb (98.4 kg)   SpO2 98%   BMI 30.27 kg/m  BP Readings from Last 3 Encounters:  11/14/22 (!) 126/92  05/15/22 123/81  12/13/21 126/82   Physical Exam Constitutional:      Appearance: Normal appearance.  HENT:     Right Ear: Tympanic membrane, ear canal and  external ear normal.     Left Ear: Tympanic membrane, ear canal and external ear normal.  Eyes:     Extraocular Movements: Extraocular movements intact.     Pupils: Pupils are equal, round, and reactive to light.  Cardiovascular:     Rate and Rhythm: Normal rate and regular rhythm.     Pulses: Normal pulses.     Heart sounds: Normal heart sounds.  Pulmonary:     Effort: Pulmonary effort is normal.     Breath sounds: Normal breath sounds.  Abdominal:     General: Abdomen is flat. Bowel sounds are normal.     Palpations: Abdomen is soft.  Musculoskeletal:        General: Normal range of motion.  Skin:    General: Skin is warm and dry.  Neurological:     Mental Status: He is alert.  Psychiatric:        Mood and Affect: Mood normal.        Behavior: Behavior normal.        Thought Content: Thought content normal.        Judgment: Judgment normal.     The 10-year ASCVD risk score (Arnett DK, et al., 2019) is: 19.4%   Values used to calculate the score:  Age: 1 years     Sex: Male     Is Non-Hispanic African American: No     Diabetic: No     Tobacco smoker: Yes     Systolic Blood Pressure: 123XX123 mmHg     Is BP treated: No     HDL Cholesterol: 47 mg/dL     Total Cholesterol: 202 mg/dL   Assessment & Plan:    Routine Health Maintenance and Physical Exam  Health Maintenance  Topic Date Due   Colon Cancer Screening  Never done   DTaP/Tdap/Td vaccine (1 - Tdap) 08/15/2014   COVID-19 Vaccine (4 - 2023-24 season) 04/14/2022   Screening for Lung Cancer  02/12/2023*   Zoster (Shingles) Vaccine (1 of 2) 02/13/2023*   Pneumonia Vaccine (1 of 2 - PCV) 05/15/2023*   Hepatitis C Screening: USPSTF Recommendation to screen - Ages 18-79 yo.  11/14/2023*   Flu Shot  03/15/2023   HPV Vaccine  Aged Out  *Topic was postponed. The date shown is not the original due date.  Wellness examination  Elevated blood pressure reading in office without diagnosis of hypertension   1. Wellness  examination Routine HCM labs ordered. Labs reviewed/discussed. Recommend regular dental and vision exams. Always use seatbelt/lap and shoulder restraints. Recommend using smoke alarms and checking batteries at least twice a year. Recommend using sunscreen when outside. Discussed immunization recommendations for shingles/pneumonia/tetanus vaccine. Patient  declines Shingrix, Tdap, and Pneumonia vaccines at this time. Will consider Pneumonia vaccine in the fall.   No updated records with most recent colonoscopy.  Discussed colon cancer screening recommendations and options.  Patient is agreeable but would like [place] to have it documented in the chart prior to ordering this.    2. Elevated blood pressure reading in office without diagnosis of hypertension Recommend heart healthy/Mediterranean diet, with whole grains, fruits, vegetable, fish, lean meats, nuts, and olive oil. Limit salt. Recommend moderate walking, 3-5 times/week for 30-50 minutes each session. Aim for at least 150 minutes.week. Goal should be pace of 3 miles/hours, or walking 1.5 miles in 30 minutes Recommend avoidance of tobacco products. Avoid excess alcohol.  3. Dyslipidemia Recent labs are reviewed.  Elevated total cholesterol, triglycerides, and LDL-but overall decreased from 6 months ago.  He would prefer to continue to hold off on taking a statin and wants to proceed with lifestyle modifications.  Lifestyle modifications he has made include: switched off of processed foods, eating more salads, consuming less sugary drinks and more seltzer waters. He also tries to limit his fast food intake. Discussed options, he is concerned about side effects from the statin and "already has too many side effects with the prostate medication." ASCVD risk score reviewed and discussed meaning of this. He does not want a prescription for a statin at this time.   4. Prediabetes Most recent A1C is 6.2, still in the prediabetic range.  Patient is  reluctant to start metformin due to associated side effects.   He denies excessive thirst, urinary frequency and increased appetite. Reports that he has not been exercising as frequently because of his occupation. He wants to continue to focus on lifestyle modifications and focus on participating in daily exercise.   5. History of tobacco use Former tobacco use: quit smoking 04/2022. Still not smoking.  Stressed importance of tobacco cessation and positive health effects associated with this. Congratulated him on this and encouraged him to continue with this lifestyle benefit.    6. History of prostate cancer Followed by urology. Currently taking  Xtandi daily.    Return in about 6 months (around 05/16/2023) for elevated BP f/u .    Les Pou, FNP

## 2022-12-21 DIAGNOSIS — C61 Malignant neoplasm of prostate: Secondary | ICD-10-CM | POA: Diagnosis not present

## 2022-12-28 DIAGNOSIS — N393 Stress incontinence (female) (male): Secondary | ICD-10-CM | POA: Diagnosis not present

## 2022-12-28 DIAGNOSIS — C775 Secondary and unspecified malignant neoplasm of intrapelvic lymph nodes: Secondary | ICD-10-CM | POA: Diagnosis not present

## 2022-12-28 DIAGNOSIS — C61 Malignant neoplasm of prostate: Secondary | ICD-10-CM | POA: Diagnosis not present

## 2023-02-19 DIAGNOSIS — D225 Melanocytic nevi of trunk: Secondary | ICD-10-CM | POA: Diagnosis not present

## 2023-02-19 DIAGNOSIS — L814 Other melanin hyperpigmentation: Secondary | ICD-10-CM | POA: Diagnosis not present

## 2023-02-19 DIAGNOSIS — L821 Other seborrheic keratosis: Secondary | ICD-10-CM | POA: Diagnosis not present

## 2023-03-29 DIAGNOSIS — C61 Malignant neoplasm of prostate: Secondary | ICD-10-CM | POA: Diagnosis not present

## 2023-05-03 DIAGNOSIS — C775 Secondary and unspecified malignant neoplasm of intrapelvic lymph nodes: Secondary | ICD-10-CM | POA: Diagnosis not present

## 2023-05-03 DIAGNOSIS — N5231 Erectile dysfunction following radical prostatectomy: Secondary | ICD-10-CM | POA: Diagnosis not present

## 2023-05-03 DIAGNOSIS — N393 Stress incontinence (female) (male): Secondary | ICD-10-CM | POA: Diagnosis not present

## 2023-05-03 DIAGNOSIS — C61 Malignant neoplasm of prostate: Secondary | ICD-10-CM | POA: Diagnosis not present

## 2023-05-16 ENCOUNTER — Encounter (HOSPITAL_BASED_OUTPATIENT_CLINIC_OR_DEPARTMENT_OTHER): Payer: Self-pay | Admitting: Family Medicine

## 2023-05-16 ENCOUNTER — Ambulatory Visit (HOSPITAL_BASED_OUTPATIENT_CLINIC_OR_DEPARTMENT_OTHER): Payer: BC Managed Care – PPO | Admitting: Family Medicine

## 2023-05-16 ENCOUNTER — Other Ambulatory Visit (HOSPITAL_BASED_OUTPATIENT_CLINIC_OR_DEPARTMENT_OTHER): Payer: Self-pay

## 2023-05-16 VITALS — BP 124/88 | HR 98 | Ht 71.0 in | Wt 214.9 lb

## 2023-05-16 DIAGNOSIS — Z87891 Personal history of nicotine dependence: Secondary | ICD-10-CM

## 2023-05-16 DIAGNOSIS — R7303 Prediabetes: Secondary | ICD-10-CM

## 2023-05-16 DIAGNOSIS — Z122 Encounter for screening for malignant neoplasm of respiratory organs: Secondary | ICD-10-CM | POA: Diagnosis not present

## 2023-05-16 DIAGNOSIS — Z23 Encounter for immunization: Secondary | ICD-10-CM | POA: Diagnosis not present

## 2023-05-16 DIAGNOSIS — E785 Hyperlipidemia, unspecified: Secondary | ICD-10-CM | POA: Diagnosis not present

## 2023-05-16 MED ORDER — COMIRNATY 30 MCG/0.3ML IM SUSY
0.3000 mL | PREFILLED_SYRINGE | Freq: Once | INTRAMUSCULAR | 0 refills | Status: AC
  Filled 2023-05-16: qty 0.3, 1d supply, fill #0

## 2023-05-16 NOTE — Assessment & Plan Note (Signed)
Most recent labs with elevated total cholesterol and LDL.  Discussed findings with patient.  He has been making lifestyle modifications as above..  Again reviewed ASCVD risk score and consideration for initiating statin therapy.  He remains reluctant to begin medication and prefers to focus on lifestyle modification Discussed importance of lifestyle modifications, recommend smoking cessation (had quit previously, has returned to smoking, however very small amount), provided handout reviewing these We will monitor labs with physical in about 6 months

## 2023-05-16 NOTE — Assessment & Plan Note (Signed)
Hemoglobin A1c earlier this year within prediabetes range at 6.2%.  He has been working on lifestyle modifications including diet and exercise.  Has been walking regularly.  No new issues such as polyuria or polydipsia. Recommend continuing with lifestyle modifications at this time, provided handout reviewing these as well and discussed in office We will recheck hemoglobin A1c at this time for monitoring

## 2023-05-16 NOTE — Progress Notes (Signed)
    Procedures performed today:    None.  Independent interpretation of notes and tests performed by another provider:   None.  Brief History, Exam, Impression, and Recommendations:    BP 124/88 (BP Location: Right Arm, Patient Position: Sitting, Cuff Size: Normal)   Pulse 98   Ht 5\' 11"  (1.803 m)   Wt 214 lb 14.4 oz (97.5 kg)   SpO2 97%   BMI 29.97 kg/m   Dyslipidemia Assessment & Plan: Most recent labs with elevated total cholesterol and LDL.  Discussed findings with patient.  He has been making lifestyle modifications as above..  Again reviewed ASCVD risk score and consideration for initiating statin therapy.  He remains reluctant to begin medication and prefers to focus on lifestyle modification Discussed importance of lifestyle modifications, recommend smoking cessation (had quit previously, has returned to smoking, however very small amount), provided handout reviewing these We will monitor labs with physical in about 6 months  Orders: -     Lipid panel  Prediabetes Assessment & Plan: Hemoglobin A1c earlier this year within prediabetes range at 6.2%.  He has been working on lifestyle modifications including diet and exercise.  Has been walking regularly.  No new issues such as polyuria or polydipsia. Recommend continuing with lifestyle modifications at this time, provided handout reviewing these as well and discussed in office We will recheck hemoglobin A1c at this time for monitoring  Orders: -     Hemoglobin A1c  History of tobacco use Assessment & Plan: Patient did quit smoking previously, has returned to cigarette use, however smoking got very low amount.  Given tobacco use history, does report at least 20-pack-year history, discussed recommendations pertaining to lung cancer screening.  He would be amenable to proceeding with screening, order placed today, we will have this done at [facility] imaging  Orders: -     CT CHEST LUNG CANCER SCREENING LOW DOSE WO  CONTRAST; Future  Screening for lung cancer -     CT CHEST LUNG CANCER SCREENING LOW DOSE WO CONTRAST; Future  Encounter for immunization -     Flu Vaccine Trivalent High Dose (Fluad)  Return in about 6 months (around 11/14/2023) for CPE with fasting labs 1 week prior.  Spent 32 minutes on this patient encounter, including preparation, chart review, face-to-face counseling with patient and coordination of care, and documentation of encounter   ___________________________________________ Jemell [place], MD, ABFM, [facility] Primary Care and Sports Medicine [facility]

## 2023-05-16 NOTE — Assessment & Plan Note (Signed)
Patient did quit smoking previously, has returned to cigarette use, however smoking got very low amount.  Given tobacco use history, does report at least 20-pack-year history, discussed recommendations pertaining to lung cancer screening.  He would be amenable to proceeding with screening, order placed today, we will have this done at [facility] imaging

## 2023-05-17 ENCOUNTER — Encounter (HOSPITAL_BASED_OUTPATIENT_CLINIC_OR_DEPARTMENT_OTHER): Payer: Self-pay | Admitting: Family Medicine

## 2023-05-17 LAB — LIPID PANEL
Chol/HDL Ratio: 5.3 {ratio} — ABNORMAL HIGH (ref 0.0–5.0)
Cholesterol, Total: 238 mg/dL — ABNORMAL HIGH (ref 100–199)
HDL: 45 mg/dL (ref >39)
LDL Chol Calc (NIH): 159 mg/dL — ABNORMAL HIGH (ref 0–99)
Triglycerides: 187 mg/dL — ABNORMAL HIGH (ref 0–149)
VLDL Cholesterol Cal: 34 mg/dL (ref 5–40)

## 2023-05-17 LAB — HEMOGLOBIN A1C
Est. average glucose Bld gHb Est-mCnc: 126 mg/dL
Hgb A1c MFr Bld: 6 % — ABNORMAL HIGH (ref 4.8–5.6)

## 2023-05-29 ENCOUNTER — Inpatient Hospital Stay
Admission: RE | Admit: 2023-05-29 | Discharge: 2023-05-29 | Disposition: A | Discharge status: Home / Self Care | Payer: BC Managed Care – PPO | Source: Ambulatory Visit | Attending: Family Medicine

## 2023-05-29 DIAGNOSIS — Z122 Encounter for screening for malignant neoplasm of respiratory organs: Secondary | ICD-10-CM

## 2023-05-29 DIAGNOSIS — Z87891 Personal history of nicotine dependence: Secondary | ICD-10-CM

## 2023-05-29 DIAGNOSIS — F17210 Nicotine dependence, cigarettes, uncomplicated: Secondary | ICD-10-CM | POA: Diagnosis not present

## 2023-07-05 ENCOUNTER — Encounter (HOSPITAL_BASED_OUTPATIENT_CLINIC_OR_DEPARTMENT_OTHER): Payer: Self-pay | Admitting: Family Medicine

## 2023-08-30 DIAGNOSIS — L821 Other seborrheic keratosis: Secondary | ICD-10-CM | POA: Diagnosis not present

## 2023-08-30 DIAGNOSIS — L814 Other melanin hyperpigmentation: Secondary | ICD-10-CM | POA: Diagnosis not present

## 2023-08-30 DIAGNOSIS — L0212 Furuncle of neck: Secondary | ICD-10-CM | POA: Diagnosis not present

## 2023-08-30 DIAGNOSIS — D225 Melanocytic nevi of trunk: Secondary | ICD-10-CM | POA: Diagnosis not present

## 2023-10-25 DIAGNOSIS — C61 Malignant neoplasm of prostate: Secondary | ICD-10-CM | POA: Diagnosis not present

## 2023-11-01 DIAGNOSIS — N5231 Erectile dysfunction following radical prostatectomy: Secondary | ICD-10-CM | POA: Diagnosis not present

## 2023-11-01 DIAGNOSIS — N393 Stress incontinence (female) (male): Secondary | ICD-10-CM | POA: Diagnosis not present

## 2023-11-01 DIAGNOSIS — C775 Secondary and unspecified malignant neoplasm of intrapelvic lymph nodes: Secondary | ICD-10-CM | POA: Diagnosis not present

## 2023-11-01 DIAGNOSIS — C61 Malignant neoplasm of prostate: Secondary | ICD-10-CM | POA: Diagnosis not present

## 2023-11-08 ENCOUNTER — Other Ambulatory Visit (HOSPITAL_BASED_OUTPATIENT_CLINIC_OR_DEPARTMENT_OTHER): Payer: Self-pay | Admitting: Family Medicine

## 2023-11-08 ENCOUNTER — Other Ambulatory Visit (HOSPITAL_BASED_OUTPATIENT_CLINIC_OR_DEPARTMENT_OTHER): Payer: BC Managed Care – PPO

## 2023-11-08 DIAGNOSIS — Z0000 Encounter for general adult medical examination without abnormal findings: Secondary | ICD-10-CM | POA: Diagnosis not present

## 2023-11-09 LAB — CBC WITH DIFFERENTIAL/PLATELET
Basophils Absolute: 0.1 10*3/uL (ref 0.0–0.2)
Basos: 1 %
EOS (ABSOLUTE): 0.1 10*3/uL (ref 0.0–0.4)
Eos: 2 %
Hematocrit: 42.5 % (ref 37.5–51.0)
Hemoglobin: 14.1 g/dL (ref 13.0–17.7)
Immature Grans (Abs): 0 10*3/uL (ref 0.0–0.1)
Immature Granulocytes: 0 %
Lymphocytes Absolute: 2 10*3/uL (ref 0.7–3.1)
Lymphs: 29 %
MCH: 31.7 pg (ref 26.6–33.0)
MCHC: 33.2 g/dL (ref 31.5–35.7)
MCV: 96 fL (ref 79–97)
Monocytes Absolute: 0.6 10*3/uL (ref 0.1–0.9)
Monocytes: 8 %
Neutrophils Absolute: 4.3 10*3/uL (ref 1.4–7.0)
Neutrophils: 60 %
Platelets: 350 10*3/uL (ref 150–450)
RBC: 4.45 x10E6/uL (ref 4.14–5.80)
RDW: 12.1 % (ref 11.6–15.4)
WBC: 7.1 10*3/uL (ref 3.4–10.8)

## 2023-11-09 LAB — COMPREHENSIVE METABOLIC PANEL WITH GFR
ALT: 12 IU/L (ref 0–44)
AST: 13 IU/L (ref 0–40)
Albumin: 4 g/dL (ref 3.9–4.9)
Alkaline Phosphatase: 64 IU/L (ref 44–121)
BUN/Creatinine Ratio: 14 (ref 10–24)
BUN: 12 mg/dL (ref 8–27)
Bilirubin Total: 0.5 mg/dL (ref 0.0–1.2)
CO2: 23 mmol/L (ref 20–29)
Calcium: 9.6 mg/dL (ref 8.6–10.2)
Chloride: 103 mmol/L (ref 96–106)
Creatinine, Ser: 0.88 mg/dL (ref 0.76–1.27)
Globulin, Total: 2.4 g/dL (ref 1.5–4.5)
Glucose: 101 mg/dL — ABNORMAL HIGH (ref 70–99)
Potassium: 4.2 mmol/L (ref 3.5–5.2)
Sodium: 139 mmol/L (ref 134–144)
Total Protein: 6.4 g/dL (ref 6.0–8.5)
eGFR: 94 mL/min/{1.73_m2} (ref >59)

## 2023-11-09 LAB — LIPID PANEL
Chol/HDL Ratio: 4.9 ratio (ref 0.0–5.0)
Cholesterol, Total: 216 mg/dL — ABNORMAL HIGH (ref 100–199)
HDL: 44 mg/dL (ref >39)
LDL Chol Calc (NIH): 140 mg/dL — ABNORMAL HIGH (ref 0–99)
Triglycerides: 178 mg/dL — ABNORMAL HIGH (ref 0–149)
VLDL Cholesterol Cal: 32 mg/dL (ref 5–40)

## 2023-11-09 LAB — HEMOGLOBIN A1C
Est. average glucose Bld gHb Est-mCnc: 126 mg/dL
Hgb A1c MFr Bld: 6 % — ABNORMAL HIGH (ref 4.8–5.6)

## 2023-11-09 LAB — TSH RFX ON ABNORMAL TO FREE T4: TSH: 2.53 u[IU]/mL (ref 0.450–4.500)

## 2023-11-15 ENCOUNTER — Encounter (HOSPITAL_BASED_OUTPATIENT_CLINIC_OR_DEPARTMENT_OTHER): Payer: Self-pay | Admitting: Family Medicine

## 2023-11-15 ENCOUNTER — Ambulatory Visit (INDEPENDENT_AMBULATORY_CARE_PROVIDER_SITE_OTHER): Payer: BC Managed Care – PPO | Admitting: Family Medicine

## 2023-11-15 VITALS — BP 122/84 | HR 96 | Ht 71.0 in | Wt 209.7 lb

## 2023-11-15 DIAGNOSIS — Z1211 Encounter for screening for malignant neoplasm of colon: Secondary | ICD-10-CM

## 2023-11-15 DIAGNOSIS — Z0000 Encounter for general adult medical examination without abnormal findings: Secondary | ICD-10-CM

## 2023-11-15 DIAGNOSIS — E785 Hyperlipidemia, unspecified: Secondary | ICD-10-CM | POA: Diagnosis not present

## 2023-11-15 NOTE — Assessment & Plan Note (Signed)
 Most recent labs with elevated total cholesterol and LDL.  Discussed findings with patient.  He has been making lifestyle modifications as above.  Again reviewed ASCVD risk score and consideration for initiating statin therapy - reviewed r/b with statin therapy, not being on medication.  He remains reluctant to begin medication and prefers to focus on lifestyle modification Discussed importance of lifestyle modifications, recommend smoking cessation (had quit previously, has returned to smoking, however very small amount), provided handout reviewing these

## 2023-11-15 NOTE — Assessment & Plan Note (Signed)
 Routine HCM labs reviewed. HCM reviewed/discussed. Anticipatory guidance regarding healthy weight, lifestyle and choices given. Recommend healthy diet.  Recommend approximately 150 minutes/week of moderate intensity exercise Recommend regular dental and vision exams Always use seatbelt/lap and shoulder restraints Recommend using smoke alarms and checking batteries at least twice a year Recommend using sunscreen when outside. Discussed colon cancer screening recommendations, options.  Patient open to referral back to [facility] GI, referral placed Discussed recommendations for shingles vaccine.  Patient declines Discussed tetanus immunization recommendations - reports being UTD UTD with lung cancer screening

## 2023-11-15 NOTE — Progress Notes (Signed)
 Subjective:    CC: Annual Physical Exam  HPI:  Montee Tallman is a 68 y.o. presenting for annual physical  I reviewed the past medical history, family history, social history, surgical history, and allergies today and no changes were needed.  Please see the problem list section below in epic for further details.  Past Medical History: Past Medical History:  Diagnosis Date   Cellulitis of neck 12/13/2021   Headache    Migraines   Prostate cancer (HCC)    Skin cancer screening 12/13/2021   Past Surgical History: Past Surgical History:  Procedure Laterality Date   CERVICAL DISCECTOMY     CYST EXCISION  1976   Spine   LYMPHADENECTOMY Bilateral 04/05/2018   Procedure: LYMPHADENECTOMY;  Surgeon: Sebastian Ache, MD;  Location: [facility];  Service: Urology;  Laterality: Bilateral;   PROSTATE BIOPSY     ROBOT ASSISTED LAPAROSCOPIC RADICAL PROSTATECTOMY N/A 04/05/2018   Procedure: XI ROBOTIC ASSISTED LAPAROSCOPIC RADICAL PROSTATECTOMY;  Surgeon: Sebastian Ache, MD;  Location: [facility];  Service: Urology;  Laterality: N/A;   TONSILLECTOMY     Social History: Social History   Socioeconomic History   Marital status: Married    Spouse name: Not on file   Number of children: 3   Years of education: Not on file   Highest education level: Not on file  Occupational History    Comment: working full time   Tobacco Use   Smoking status: Some Days    Current packs/day: 0.00    Average packs/day: 0.5 packs/day for 40.0 years (20.0 ttl pk-yrs)    Types: Cigarettes    Start date: 09/15/1975    Last attempt to quit: 09/15/2015    Years since quitting: 8.1    Passive exposure: Current   Smokeless tobacco: Never  Vaping Use   Vaping status: Never Used  Substance and Sexual Activity   Alcohol use: Never   Drug use: Never   Sexual activity: Not Currently  Other Topics Concern   Not on file  Social History Narrative   Not on file   Social Drivers of Health   Financial Resource Strain: Low  Risk  (11/15/2023)   Overall Financial Resource Strain (CARDIA)    Difficulty of Paying Living Expenses: Not hard at all  Food Insecurity: No Food Insecurity (11/15/2023)   Hunger Vital Sign    Worried About Running Out of Food in the Last Year: Never true    Ran Out of Food in the Last Year: Never true  Transportation Needs: No Transportation Needs (11/15/2023)   PRAPARE - Administrator, Civil Service (Medical): No    Lack of Transportation (Non-Medical): No  Physical Activity: Insufficiently Active (11/15/2023)   Exercise Vital Sign    Days of Exercise per Week: 3 days    Minutes of Exercise per Session: 20 min  Stress: No Stress Concern Present (11/15/2023)   [employer] of Occupational Health - Occupational Stress Questionnaire    Feeling of Stress : Not at all  Social Connections: Moderately Isolated (11/15/2023)   Social Connection and Isolation Panel [NHANES]    Frequency of Communication with Friends and Family: More than three times a week    Frequency of Social Gatherings with Friends and Family: More than three times a week    Attends Religious Services: Never    Database administrator or Organizations: No    Attends Banker Meetings: Never    Marital Status: Married   Family History: Family  History  Problem Relation Age of Onset   Hypertension Father    Heart disease Father    Heart attack Father    Prostate cancer Neg Hx    Breast cancer Neg Hx    Colon cancer Neg Hx    Pancreatic cancer Neg Hx    Allergies: No Known Allergies Medications: See med rec.  Review of Systems: No headache, visual changes, nausea, vomiting, diarrhea, constipation, dizziness, abdominal pain, skin rash, fevers, chills, night sweats, swollen lymph nodes, weight loss, chest pain, body aches, joint swelling, muscle aches, shortness of breath, mood changes, visual or auditory hallucinations.  Objective:    BP 122/84 (BP Location: Left Arm, Patient Position: Sitting,  Cuff Size: Normal)   Pulse 96   Ht 5\' 11"  (1.803 m)   Wt 209 lb 11.2 oz (95.1 kg)   SpO2 97%   BMI 29.25 kg/m   General: Well Developed, well nourished, and in no acute distress. Neuro: Alert and oriented x3, extra-ocular muscles intact, sensation grossly intact. Cranial nerves II through XII are intact, motor, sensory, and coordinative functions are all intact. HEENT: Normocephalic, atraumatic, pupils equal round reactive to light, neck supple, no masses, no lymphadenopathy, thyroid nonpalpable. Oropharynx, nasopharynx, external ear canals are unremarkable. Skin: Warm and dry, no rashes noted. Cardiac: Regular rate and rhythm, no murmurs rubs or gallops. Respiratory: Clear to auscultation bilaterally. Not using accessory muscles, speaking in full sentences. Abdominal: Soft, nontender, nondistended, positive bowel sounds, no masses, no organomegaly. Musculoskeletal: Shoulder, elbow, wrist, hip, knee, ankle stable, and with full range of motion.  Impression and Recommendations:    Wellness examination Assessment & Plan: Routine HCM labs reviewed. HCM reviewed/discussed. Anticipatory guidance regarding healthy weight, lifestyle and choices given. Recommend healthy diet.  Recommend approximately 150 minutes/week of moderate intensity exercise Recommend regular dental and vision exams Always use seatbelt/lap and shoulder restraints Recommend using smoke alarms and checking batteries at least twice a year Recommend using sunscreen when outside. Discussed colon cancer screening recommendations, options.  Patient open to referral back to [facility], referral placed Discussed recommendations for shingles vaccine.  Patient declines Discussed tetanus immunization recommendations - reports being UTD UTD with lung cancer screening   Dyslipidemia Assessment & Plan: Most recent labs with elevated total cholesterol and LDL.  Discussed findings with patient.  He has been making lifestyle  modifications as above.  Again reviewed ASCVD risk score and consideration for initiating statin therapy - reviewed r/b with statin therapy, not being on medication.  He remains reluctant to begin medication and prefers to focus on lifestyle modification Discussed importance of lifestyle modifications, recommend smoking cessation (had quit previously, has returned to smoking, however very small amount), provided handout reviewing these   Colon cancer screening -     Ambulatory referral to Gastroenterology  Return in about 1 year (around 11/14/2024) for CPE.   ___________________________________________ Hughey Rittenberry de Peru, MD, ABFM, CAQSM Primary Care and Sports Medicine [facility]

## 2023-11-15 NOTE — Patient Instructions (Signed)
  Medication Instructions:  Your physician recommends that you continue on your current medications as directed. Please refer to the Current Medication list given to you today. --If you need a refill on any your medications before your next appointment, please call your pharmacy first. If no refills are authorized on file call the office.-- Lab Work: Your physician has recommended that you have lab work today: 1 week before next visit  If you have labs (blood work) drawn today and your tests are completely normal, you will receive your results via MyChart message OR a phone call from our staff.  Please ensure you check your voicemail in the event that you authorized detailed messages to be left on a delegated number. If you have any lab test that is abnormal or we need to change your treatment, we will call you to review the results.   Follow-Up: Your next appointment:   Your physician recommends that you schedule a follow-up appointment in: 1 year physical with Dr. de Peru / nurse visit in the fall for shots  You will receive a text message or e-mail with a link to a survey about your care and experience with [place] today! We would greatly appreciate your feedback!   Thanks for letting us be apart of your health journey!!  Primary Care and Sports Medicine   Dr. Ceasar Mons [place]   We encourage you to activate your patient portal called "MyChart".  Sign up information is provided on this After Visit Summary.  MyChart is used to connect with patients for Virtual Visits (Telemedicine).  Patients are able to view lab/test results, encounter notes, upcoming appointments, etc.  Non-urgent messages can be sent to your provider as well. To learn more about what you can do with MyChart, please visit --  [email or web address].

## 2024-05-01 DIAGNOSIS — C775 Secondary and unspecified malignant neoplasm of intrapelvic lymph nodes: Secondary | ICD-10-CM | POA: Diagnosis not present

## 2024-05-01 DIAGNOSIS — N393 Stress incontinence (female) (male): Secondary | ICD-10-CM | POA: Diagnosis not present

## 2024-05-01 DIAGNOSIS — C61 Malignant neoplasm of prostate: Secondary | ICD-10-CM | POA: Diagnosis not present

## 2024-05-01 DIAGNOSIS — N5231 Erectile dysfunction following radical prostatectomy: Secondary | ICD-10-CM | POA: Diagnosis not present

## 2024-05-02 ENCOUNTER — Inpatient Hospital Stay (HOSPITAL_COMMUNITY)

## 2024-05-02 ENCOUNTER — Encounter (HOSPITAL_COMMUNITY): Payer: Self-pay | Admitting: Cardiology

## 2024-05-02 ENCOUNTER — Inpatient Hospital Stay (HOSPITAL_COMMUNITY)
Admission: EM | Admit: 2024-05-02 | Discharge: 2024-05-05 | DRG: 270 | Disposition: A | Discharge status: Home / Self Care | Attending: Cardiology | Admitting: Cardiology

## 2024-05-02 ENCOUNTER — Other Ambulatory Visit: Payer: Self-pay

## 2024-05-02 ENCOUNTER — Inpatient Hospital Stay (HOSPITAL_COMMUNITY)
Admission: EM | Disposition: A | Discharge status: Home / Self Care | Payer: Self-pay | Source: Home / Self Care | Attending: Cardiology

## 2024-05-02 ENCOUNTER — Other Ambulatory Visit (HOSPITAL_COMMUNITY)

## 2024-05-02 DIAGNOSIS — R0602 Shortness of breath: Secondary | ICD-10-CM | POA: Diagnosis not present

## 2024-05-02 DIAGNOSIS — E6609 Other obesity due to excess calories: Secondary | ICD-10-CM | POA: Diagnosis not present

## 2024-05-02 DIAGNOSIS — Z8546 Personal history of malignant neoplasm of prostate: Secondary | ICD-10-CM | POA: Diagnosis not present

## 2024-05-02 DIAGNOSIS — R001 Bradycardia, unspecified: Secondary | ICD-10-CM | POA: Diagnosis not present

## 2024-05-02 DIAGNOSIS — R739 Hyperglycemia, unspecified: Secondary | ICD-10-CM | POA: Diagnosis present

## 2024-05-02 DIAGNOSIS — Z6831 Body mass index (BMI) 31.0-31.9, adult: Secondary | ICD-10-CM | POA: Diagnosis not present

## 2024-05-02 DIAGNOSIS — I5021 Acute systolic (congestive) heart failure: Secondary | ICD-10-CM | POA: Diagnosis not present

## 2024-05-02 DIAGNOSIS — I959 Hypotension, unspecified: Secondary | ICD-10-CM | POA: Diagnosis not present

## 2024-05-02 DIAGNOSIS — I700 Atherosclerosis of aorta: Secondary | ICD-10-CM | POA: Diagnosis not present

## 2024-05-02 DIAGNOSIS — R570 Cardiogenic shock: Secondary | ICD-10-CM | POA: Diagnosis present

## 2024-05-02 DIAGNOSIS — R7303 Prediabetes: Secondary | ICD-10-CM | POA: Diagnosis not present

## 2024-05-02 DIAGNOSIS — I2109 ST elevation (STEMI) myocardial infarction involving other coronary artery of anterior wall: Secondary | ICD-10-CM | POA: Diagnosis not present

## 2024-05-02 DIAGNOSIS — I3139 Other pericardial effusion (noninflammatory): Secondary | ICD-10-CM | POA: Diagnosis not present

## 2024-05-02 DIAGNOSIS — E785 Hyperlipidemia, unspecified: Secondary | ICD-10-CM

## 2024-05-02 DIAGNOSIS — Z452 Encounter for adjustment and management of vascular access device: Secondary | ICD-10-CM | POA: Diagnosis not present

## 2024-05-02 DIAGNOSIS — E7800 Pure hypercholesterolemia, unspecified: Secondary | ICD-10-CM | POA: Diagnosis not present

## 2024-05-02 DIAGNOSIS — Z9079 Acquired absence of other genital organ(s): Secondary | ICD-10-CM | POA: Diagnosis not present

## 2024-05-02 DIAGNOSIS — Z79899 Other long term (current) drug therapy: Secondary | ICD-10-CM

## 2024-05-02 DIAGNOSIS — I2510 Atherosclerotic heart disease of native coronary artery without angina pectoris: Secondary | ICD-10-CM | POA: Diagnosis present

## 2024-05-02 DIAGNOSIS — I2102 ST elevation (STEMI) myocardial infarction involving left anterior descending coronary artery: Secondary | ICD-10-CM | POA: Diagnosis not present

## 2024-05-02 DIAGNOSIS — F17210 Nicotine dependence, cigarettes, uncomplicated: Secondary | ICD-10-CM | POA: Diagnosis not present

## 2024-05-02 DIAGNOSIS — Z23 Encounter for immunization: Secondary | ICD-10-CM

## 2024-05-02 DIAGNOSIS — Z7901 Long term (current) use of anticoagulants: Secondary | ICD-10-CM

## 2024-05-02 DIAGNOSIS — Z955 Presence of coronary angioplasty implant and graft: Secondary | ICD-10-CM

## 2024-05-02 DIAGNOSIS — Z981 Arthrodesis status: Secondary | ICD-10-CM | POA: Diagnosis not present

## 2024-05-02 DIAGNOSIS — Z8249 Family history of ischemic heart disease and other diseases of the circulatory system: Secondary | ICD-10-CM

## 2024-05-02 HISTORY — PX: IABP INSERTION: CATH118242

## 2024-05-02 HISTORY — PX: LEFT HEART CATH AND CORONARY ANGIOGRAPHY: CATH118249

## 2024-05-02 HISTORY — PX: RIGHT HEART CATH: CATH118263

## 2024-05-02 HISTORY — PX: CORONARY/GRAFT ACUTE MI REVASCULARIZATION: CATH118305

## 2024-05-02 LAB — CBC
HCT: 38.6 % — ABNORMAL LOW (ref 39.0–52.0)
Hemoglobin: 12.8 g/dL — ABNORMAL LOW (ref 13.0–17.0)
MCH: 31.8 pg (ref 26.0–34.0)
MCHC: 33.2 g/dL (ref 30.0–36.0)
MCV: 96 fL (ref 80.0–100.0)
Platelets: 323 K/uL (ref 150–400)
RBC: 4.02 MIL/uL — ABNORMAL LOW (ref 4.22–5.81)
RDW: 12.7 % (ref 11.5–15.5)
WBC: 12.9 K/uL — ABNORMAL HIGH (ref 4.0–10.5)
nRBC: 0 % (ref 0.0–0.2)

## 2024-05-02 LAB — POCT I-STAT EG7
Acid-base deficit: 4 mmol/L — ABNORMAL HIGH (ref 0.0–2.0)
Bicarbonate: 21.2 mmol/L (ref 20.0–28.0)
Calcium, Ion: 1.18 mmol/L (ref 1.15–1.40)
HCT: 36 % — ABNORMAL LOW (ref 39.0–52.0)
Hemoglobin: 12.2 g/dL — ABNORMAL LOW (ref 13.0–17.0)
O2 Saturation: 51 %
Potassium: 3.9 mmol/L (ref 3.5–5.1)
Sodium: 138 mmol/L (ref 135–145)
TCO2: 22 mmol/L (ref 22–32)
pCO2, Ven: 36.1 mmHg — ABNORMAL LOW (ref 44–60)
pH, Ven: 7.376 (ref 7.25–7.43)
pO2, Ven: 28 mmHg — CL (ref 32–45)

## 2024-05-02 LAB — COMPREHENSIVE METABOLIC PANEL WITH GFR
ALT: 14 U/L (ref 0–44)
ALT: 54 U/L — ABNORMAL HIGH (ref 0–44)
AST: 22 U/L (ref 15–41)
AST: 452 U/L — ABNORMAL HIGH (ref 15–41)
Albumin: 3.1 g/dL — ABNORMAL LOW (ref 3.5–5.0)
Albumin: 3.3 g/dL — ABNORMAL LOW (ref 3.5–5.0)
Alkaline Phosphatase: 46 U/L (ref 38–126)
Alkaline Phosphatase: 48 U/L (ref 38–126)
Anion gap: 13 (ref 5–15)
Anion gap: 12 (ref 5–15)
BUN: 13 mg/dL (ref 8–23)
BUN: 15 mg/dL (ref 8–23)
CO2: 18 mmol/L — ABNORMAL LOW (ref 22–32)
CO2: 19 mmol/L — ABNORMAL LOW (ref 22–32)
Calcium: 8.7 mg/dL — ABNORMAL LOW (ref 8.9–10.3)
Calcium: 8.6 mg/dL — ABNORMAL LOW (ref 8.9–10.3)
Chloride: 105 mmol/L (ref 98–111)
Chloride: 106 mmol/L (ref 98–111)
Creatinine, Ser: 0.89 mg/dL (ref 0.61–1.24)
Creatinine, Ser: 0.75 mg/dL (ref 0.61–1.24)
GFR, Estimated: >60 mL/min (ref >60)
GFR, Estimated: >60 mL/min (ref >60)
Glucose, Bld: 152 mg/dL — ABNORMAL HIGH (ref 70–99)
Glucose, Bld: 159 mg/dL — ABNORMAL HIGH (ref 70–99)
Potassium: 3.3 mmol/L — ABNORMAL LOW (ref 3.5–5.1)
Potassium: 4.2 mmol/L (ref 3.5–5.1)
Sodium: 136 mmol/L (ref 135–145)
Sodium: 137 mmol/L (ref 135–145)
Total Bilirubin: 0.6 mg/dL (ref 0.0–1.2)
Total Bilirubin: 0.5 mg/dL (ref 0.0–1.2)
Total Protein: 5.9 g/dL — ABNORMAL LOW (ref 6.5–8.1)
Total Protein: 6.1 g/dL — ABNORMAL LOW (ref 6.5–8.1)

## 2024-05-02 LAB — CBC WITH DIFFERENTIAL/PLATELET
Abs Immature Granulocytes: 0.08 K/uL — ABNORMAL HIGH (ref 0.00–0.07)
Basophils Absolute: 0.1 K/uL (ref 0.0–0.1)
Basophils Relative: 1 %
Eosinophils Absolute: 0.1 K/uL (ref 0.0–0.5)
Eosinophils Relative: 1 %
HCT: 37.3 % — ABNORMAL LOW (ref 39.0–52.0)
Hemoglobin: 12.6 g/dL — ABNORMAL LOW (ref 13.0–17.0)
Immature Granulocytes: 1 %
Lymphocytes Relative: 20 %
Lymphs Abs: 2.3 K/uL (ref 0.7–4.0)
MCH: 31.5 pg (ref 26.0–34.0)
MCHC: 33.8 g/dL (ref 30.0–36.0)
MCV: 93.3 fL (ref 80.0–100.0)
Monocytes Absolute: 0.8 K/uL (ref 0.1–1.0)
Monocytes Relative: 7 %
Neutro Abs: 8.4 K/uL — ABNORMAL HIGH (ref 1.7–7.7)
Neutrophils Relative %: 70 %
Platelets: 328 K/uL (ref 150–400)
RBC: 4 MIL/uL — ABNORMAL LOW (ref 4.22–5.81)
RDW: 12.6 % (ref 11.5–15.5)
WBC: 11.7 K/uL — ABNORMAL HIGH (ref 4.0–10.5)
nRBC: 0 % (ref 0.0–0.2)

## 2024-05-02 LAB — POCT I-STAT 7, (LYTES, BLD GAS, ICA,H+H)
Acid-base deficit: 4 mmol/L — ABNORMAL HIGH (ref 0.0–2.0)
Bicarbonate: 19 mmol/L — ABNORMAL LOW (ref 20.0–28.0)
Calcium, Ion: 1.17 mmol/L (ref 1.15–1.40)
HCT: 36 % — ABNORMAL LOW (ref 39.0–52.0)
Hemoglobin: 12.2 g/dL — ABNORMAL LOW (ref 13.0–17.0)
O2 Saturation: 97 %
Potassium: 4 mmol/L (ref 3.5–5.1)
Sodium: 138 mmol/L (ref 135–145)
TCO2: 20 mmol/L — ABNORMAL LOW (ref 22–32)
pCO2 arterial: 28.8 mmHg — ABNORMAL LOW (ref 32–48)
pH, Arterial: 7.427 (ref 7.35–7.45)
pO2, Arterial: 89 mmHg (ref 83–108)

## 2024-05-02 LAB — LIPID PANEL
Cholesterol: 186 mg/dL (ref 0–200)
HDL: 38 mg/dL — ABNORMAL LOW (ref >40)
LDL Cholesterol: 119 mg/dL — ABNORMAL HIGH (ref 0–99)
Total CHOL/HDL Ratio: 4.9 ratio
Triglycerides: 146 mg/dL (ref <150)
VLDL: 29 mg/dL (ref 0–40)

## 2024-05-02 LAB — TROPONIN I (HIGH SENSITIVITY)
Troponin I (High Sensitivity): 695 ng/L (ref <18)
Troponin I (High Sensitivity): >24000 ng/L (ref <18)

## 2024-05-02 LAB — ECHOCARDIOGRAM COMPLETE
AR max vel: 3.06 cm2
AV Peak grad: 6 mmHg
Ao pk vel: 1.22 m/s
Area-P 1/2: 4.15 cm2
Est EF: 25
Height: 71 in
S' Lateral: 2.8 cm
Weight: 3604.96 [oz_av]

## 2024-05-02 LAB — CG4 I-STAT (LACTIC ACID)
Lactic Acid, Venous: 1.6 mmol/L (ref 0.5–1.9)
Lactic Acid, Venous: 1 mmol/L (ref 0.5–1.9)
Lactic Acid, Venous: 2 mmol/L (ref 0.5–1.9)
Lactic Acid, Venous: 1.6 mmol/L (ref 0.5–1.9)
Lactic Acid, Venous: 4.3 mmol/L (ref 0.5–1.9)
Lactic Acid, Venous: 5.6 mmol/L (ref 0.5–1.9)
Lactic Acid, Venous: 4.5 mmol/L (ref 0.5–1.9)

## 2024-05-02 LAB — COOXEMETRY PANEL
Carboxyhemoglobin: 2.8 % — ABNORMAL HIGH (ref 0.5–1.5)
Carboxyhemoglobin: 3.1 % — ABNORMAL HIGH (ref 0.5–1.5)
Methemoglobin: 0.8 % (ref 0.0–1.5)
Methemoglobin: 0.8 % (ref 0.0–1.5)
O2 Saturation: 60.2 %
O2 Saturation: 57.8 %
Total hemoglobin: 13.5 g/dL (ref 12.0–16.0)
Total hemoglobin: 13.5 g/dL (ref 12.0–16.0)

## 2024-05-02 LAB — HEMOGLOBIN A1C
Hgb A1c MFr Bld: 5.7 % — ABNORMAL HIGH (ref 4.8–5.6)
Mean Plasma Glucose: 116.89 mg/dL

## 2024-05-02 LAB — POCT ACTIVATED CLOTTING TIME
Activated Clotting Time: 233 s
Activated Clotting Time: 273 s

## 2024-05-02 LAB — MAGNESIUM
Magnesium: 2 mg/dL (ref 1.7–2.4)
Magnesium: 1.9 mg/dL (ref 1.7–2.4)

## 2024-05-02 LAB — GLUCOSE, CAPILLARY: Glucose-Capillary: 127 mg/dL — ABNORMAL HIGH (ref 70–99)

## 2024-05-02 LAB — APTT: aPTT: >200 s (ref 24–36)

## 2024-05-02 LAB — MRSA NEXT GEN BY PCR, NASAL: MRSA by PCR Next Gen: DETECTED — AB

## 2024-05-02 LAB — PROTIME-INR
INR: 1.3 — ABNORMAL HIGH (ref 0.8–1.2)
Prothrombin Time: 17 s — ABNORMAL HIGH (ref 11.4–15.2)

## 2024-05-02 SURGERY — LEFT HEART CATH AND CORONARY ANGIOGRAPHY
Anesthesia: LOCAL

## 2024-05-02 MED ORDER — TIROFIBAN (AGGRASTAT) BOLUS VIA INFUSION
INTRAVENOUS | Status: DC | PRN
  Administered 2024-05-02: 2347.5 ug via INTRAVENOUS

## 2024-05-02 MED ORDER — FENTANYL CITRATE (PF) 100 MCG/2ML IJ SOLN
INTRAMUSCULAR | Status: DC | PRN
  Administered 2024-05-02 (×2): 50 ug via INTRAVENOUS
  Administered 2024-05-02: 25 ug via INTRAVENOUS

## 2024-05-02 MED ORDER — FREE WATER
500.0000 mL | Freq: Once | Status: AC
  Administered 2024-05-02: 500 mL via ORAL

## 2024-05-02 MED ORDER — SODIUM CHLORIDE 0.9% FLUSH: 3.0000 mL | INTRAVENOUS | Status: DC | PRN

## 2024-05-02 MED ORDER — MAGNESIUM SULFATE 2 GM/50ML IV SOLN
2.0000 g | Freq: Once | INTRAVENOUS | Status: AC
  Administered 2024-05-02: 2 g via INTRAVENOUS
  Filled 2024-05-02: qty 50

## 2024-05-02 MED ORDER — LIDOCAINE HCL (PF) 1 % IJ SOLN
INTRAMUSCULAR | Status: DC | PRN
  Administered 2024-05-02: 10 mL
  Administered 2024-05-02: 5 mL

## 2024-05-02 MED ORDER — ONDANSETRON HCL 4 MG/2ML IJ SOLN: 4.0000 mg | Freq: Four times a day (QID) | INTRAMUSCULAR | Status: DC | PRN

## 2024-05-02 MED ORDER — HEPARIN SODIUM (PORCINE) 1000 UNIT/ML IJ SOLN
INTRAMUSCULAR | Status: DC | PRN
  Administered 2024-05-02: 9000 [IU] via INTRAVENOUS
  Administered 2024-05-02: 3000 [IU] via INTRAVENOUS

## 2024-05-02 MED ORDER — VERAPAMIL HCL 2.5 MG/ML IV SOLN: INTRAVENOUS | Status: DC | PRN

## 2024-05-02 MED ORDER — NOREPINEPHRINE BITARTRATE 1 MG/ML IV SOLN
INTRAVENOUS | Status: DC | PRN
  Administered 2024-05-02: 5 ug/min via INTRAVENOUS

## 2024-05-02 MED ORDER — FUROSEMIDE 10 MG/ML IJ SOLN
40.0000 mg | Freq: Once | INTRAMUSCULAR | Status: AC
  Administered 2024-05-02: 40 mg via INTRAVENOUS
  Filled 2024-05-02: qty 4

## 2024-05-02 MED ORDER — NOREPINEPHRINE 4 MG/250ML-% IV SOLN
INTRAVENOUS | Status: AC
  Filled 2024-05-02: qty 250

## 2024-05-02 MED ORDER — VERAPAMIL HCL 2.5 MG/ML IV SOLN
INTRAVENOUS | Status: AC
  Filled 2024-05-02: qty 2

## 2024-05-02 MED ORDER — HEPARIN (PORCINE) 25000 UT/250ML-% IV SOLN
1200.0000 [IU]/h | INTRAVENOUS | Status: DC
  Administered 2024-05-02: 1000 [IU]/h via INTRAVENOUS
  Filled 2024-05-02: qty 250

## 2024-05-02 MED ORDER — EPINEPHRINE HCL 5 MG/250ML IV SOLN IN NS
2.0000 ug/min | INTRAVENOUS | Status: DC
  Administered 2024-05-02: 2 ug/min via INTRAVENOUS
  Filled 2024-05-02: qty 250

## 2024-05-02 MED ORDER — OXIDIZED CELLULOSE EX PADS
1.0000 | MEDICATED_PAD | Freq: Once | CUTANEOUS | Status: AC
  Administered 2024-05-03: 1 via TOPICAL
  Filled 2024-05-02: qty 1

## 2024-05-02 MED ORDER — MUPIROCIN 2 % EX OINT
1.0000 | TOPICAL_OINTMENT | Freq: Two times a day (BID) | CUTANEOUS | Status: DC
Start: 2024-05-02 — End: 2024-05-05
  Administered 2024-05-03 – 2024-05-05 (×5): 1 via NASAL
  Filled 2024-05-02 (×3): qty 22

## 2024-05-02 MED ORDER — TIROFIBAN HCL IN NACL 5-0.9 MG/100ML-% IV SOLN
INTRAVENOUS | Status: AC
  Filled 2024-05-02: qty 100

## 2024-05-02 MED ORDER — MIDAZOLAM HCL 2 MG/2ML IJ SOLN
INTRAMUSCULAR | Status: AC
Start: 2024-05-02 — End: 2024-05-02
  Filled 2024-05-02: qty 2

## 2024-05-02 MED ORDER — LIDOCAINE HCL (PF) 1 % IJ SOLN
INTRAMUSCULAR | Status: AC
  Filled 2024-05-02: qty 30

## 2024-05-02 MED ORDER — PRASUGREL HCL 10 MG PO TABS
ORAL_TABLET | ORAL | Status: AC
  Filled 2024-05-02: qty 6

## 2024-05-02 MED ORDER — IOHEXOL 350 MG/ML SOLN
INTRAVENOUS | Status: DC | PRN
  Administered 2024-05-02: 90 mL via INTRA_ARTERIAL

## 2024-05-02 MED ORDER — ACETAMINOPHEN 325 MG PO TABS: 650.0000 mg | ORAL_TABLET | ORAL | Status: DC | PRN

## 2024-05-02 MED ORDER — FENTANYL CITRATE (PF) 100 MCG/2ML IJ SOLN
INTRAMUSCULAR | Status: AC
Start: 2024-05-02 — End: 2024-05-02
  Filled 2024-05-02: qty 2

## 2024-05-02 MED ORDER — ATORVASTATIN CALCIUM 80 MG PO TABS
80.0000 mg | ORAL_TABLET | Freq: Every day | ORAL | Status: DC
  Administered 2024-05-02 – 2024-05-05 (×4): 80 mg via ORAL
  Filled 2024-05-02 (×4): qty 1

## 2024-05-02 MED ORDER — PERFLUTREN LIPID MICROSPHERE
1.0000 mL | INTRAVENOUS | Status: AC | PRN
Start: 2024-05-02 — End: 2024-05-02
  Administered 2024-05-02: 4 mL via INTRAVENOUS

## 2024-05-02 MED ORDER — FENTANYL CITRATE (PF) 100 MCG/2ML IJ SOLN
INTRAMUSCULAR | Status: AC
  Filled 2024-05-02: qty 2

## 2024-05-02 MED ORDER — NOREPINEPHRINE 4 MG/250ML-% IV SOLN: 0.0000 ug/min | INTRAVENOUS | Status: DC

## 2024-05-02 MED ORDER — TIROFIBAN (AGGRASTAT) 50MCG/ML FOR INTRACORONARY USE
INTRACORONARY | Status: DC | PRN
  Administered 2024-05-02: 10 mL via INTRACORONARY

## 2024-05-02 MED ORDER — PRASUGREL HCL 10 MG PO TABS
10.0000 mg | ORAL_TABLET | Freq: Every day | ORAL | Status: DC
  Administered 2024-05-03 – 2024-05-05 (×3): 10 mg via ORAL
  Filled 2024-05-02 (×5): qty 1

## 2024-05-02 MED ORDER — PHENYLEPHRINE 80 MCG/ML (10ML) SYRINGE FOR IV PUSH (FOR BLOOD PRESSURE SUPPORT)
200.0000 ug | PREFILLED_SYRINGE | Freq: Once | INTRAVENOUS | Status: AC
  Administered 2024-05-02: 200 ug via INTRAVENOUS

## 2024-05-02 MED ORDER — ASPIRIN 81 MG PO CHEW
81.0000 mg | CHEWABLE_TABLET | Freq: Every day | ORAL | Status: DC
  Administered 2024-05-03 – 2024-05-04 (×2): 81 mg via ORAL
  Filled 2024-05-02 (×2): qty 1

## 2024-05-02 MED ORDER — SODIUM CHLORIDE 0.9% FLUSH
3.0000 mL | Freq: Two times a day (BID) | INTRAVENOUS | Status: DC
  Administered 2024-05-02 – 2024-05-05 (×7): 3 mL via INTRAVENOUS

## 2024-05-02 MED ORDER — MIDAZOLAM HCL 2 MG/2ML IJ SOLN
INTRAMUSCULAR | Status: DC | PRN
  Administered 2024-05-02: 1 mg via INTRAVENOUS

## 2024-05-02 MED ORDER — PRASUGREL HCL 10 MG PO TABS
ORAL_TABLET | ORAL | Status: DC | PRN
  Administered 2024-05-02: 60 mg via ORAL

## 2024-05-02 MED ORDER — POTASSIUM CHLORIDE CRYS ER 20 MEQ PO TBCR
40.0000 meq | EXTENDED_RELEASE_TABLET | ORAL | Status: AC
  Administered 2024-05-02 (×2): 40 meq via ORAL
  Filled 2024-05-02 (×2): qty 2

## 2024-05-02 MED ORDER — HEPARIN SODIUM (PORCINE) 1000 UNIT/ML IJ SOLN
INTRAMUSCULAR | Status: AC
  Filled 2024-05-02: qty 10

## 2024-05-02 MED ORDER — HEPARIN SODIUM (PORCINE) 1000 UNIT/ML IJ SOLN
INTRAMUSCULAR | Status: AC
Start: 2024-05-02 — End: 2024-05-02
  Filled 2024-05-02: qty 20

## 2024-05-02 MED ORDER — CHLORHEXIDINE GLUCONATE CLOTH 2 % EX PADS
6.0000 | MEDICATED_PAD | Freq: Every day | CUTANEOUS | Status: DC
Start: 2024-05-02 — End: 2024-05-05
  Administered 2024-05-02 – 2024-05-05 (×4): 6 via TOPICAL

## 2024-05-02 MED ORDER — HEPARIN (PORCINE) IN NACL 1000-0.9 UT/500ML-% IV SOLN
INTRAVENOUS | Status: DC | PRN
  Administered 2024-05-02 (×2): 500 mL

## 2024-05-02 MED ORDER — SODIUM CHLORIDE 0.9 % IV SOLN: 250.0000 mL | INTRAVENOUS | Status: AC | PRN

## 2024-05-02 MED ORDER — TIROFIBAN HCL IV 12.5 MG/250 ML
INTRAVENOUS | Status: DC | PRN
  Administered 2024-05-02: .15 ug/kg/min via INTRAVENOUS

## 2024-05-02 SURGICAL SUPPLY — 21 items
BALLOON EMERGE MR 2.5X12 (BALLOONS) IMPLANT
BALLOON IABP SENS PLUS 8F 50CC (BALLOONS) IMPLANT
CATH INFINITI JR4 5F (CATHETERS) IMPLANT
CATH LAUNCHER 6FR EBU3.5 (CATHETERS) IMPLANT
CATH SWAN GANZ 7F STRAIGHT (CATHETERS) IMPLANT
DEVICE RAD COMP TR BAND LRG (VASCULAR PRODUCTS) IMPLANT
GLIDESHEATH SLEND A-KIT 6F 22G (SHEATH) IMPLANT
GLIDESHEATH SLEND SS 6F .021 (SHEATH) IMPLANT
GUIDEWIRE INQWIRE 1.5J.035X260 (WIRE) IMPLANT
KIT ENCORE 26 ADVANTAGE (KITS) IMPLANT
KIT SYRINGE INJ CVI SPIKEX1 (MISCELLANEOUS) IMPLANT
PACK CARDIAC CATHETERIZATION (CUSTOM PROCEDURE TRAY) ×2 IMPLANT
SET ATX-X65L (MISCELLANEOUS) IMPLANT
SHEATH PINNACLE 7F 10CM (SHEATH) IMPLANT
SHEATH PROBE COVER 6X72 (BAG) IMPLANT
SHIELD CATH-GARD CONTAMINATION (MISCELLANEOUS) IMPLANT
STENT SYNERGY XD 3.50X38 (Permanent Stent) IMPLANT
WIRE ASAHI PROWATER 180CM (WIRE) IMPLANT
WIRE MICRO SET SILHO 5FR 7 (SHEATH) IMPLANT
WIRE MICROINTRODUCER 60CM (WIRE) IMPLANT
WIRE RUNTHROUGH .014X180CM (WIRE) IMPLANT

## 2024-05-02 NOTE — Progress Notes (Signed)
 Echocardiogram 2D Echocardiogram has been performed.  Andre Cowan 05/02/2024, 6:03 PM

## 2024-05-02 NOTE — Progress Notes (Addendum)
 CARDIOLOGY ADMIT NOTE     Patient ID: Andre Cowan MRN: [id number] DOB/AGE: [birth year] 68 y.o.  Admit date: 05/02/2024.   Primary Physician:  de [place], Raymond J, MD  Patient ID: Andre Cowan, male    DOB: 01/24/1956, 68 y.o.   MRN: [id number]  CC: Chest pain  HPI:    Andre Cowan  is a 68 y.o. 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  EMS was activated, he was found to have STEMI in the anterolateral wall.  He was rushed to the emergency room where admitted.  Still having rising chest pain, very uncomfortable and diaphoretic.  Past Medical History:  Diagnosis Date   Cellulitis of neck 12/13/2021   Headache    Migraines   Prostate cancer (HCC)    Skin cancer screening 12/13/2021   Past Surgical History:  Procedure Laterality Date   CERVICAL DISCECTOMY     CYST EXCISION  1976   Spine   LYMPHADENECTOMY Bilateral 04/05/2018   Procedure: LYMPHADENECTOMY;  Surgeon: Alvaro Hummer, MD;  Location: [facility];  Service: Urology;  Laterality: Bilateral;   PROSTATE BIOPSY     ROBOT ASSISTED LAPAROSCOPIC RADICAL PROSTATECTOMY N/A 04/05/2018   Procedure: XI ROBOTIC ASSISTED LAPAROSCOPIC RADICAL PROSTATECTOMY;  Surgeon: Alvaro Hummer, MD;  Location: [facility];  Service: Urology;  Laterality: N/A;   TONSILLECTOMY     Social History   Tobacco Use   Smoking status: Some Days    Current packs/day: 0.00    Average packs/day: 0.5 packs/day for 40.0 years (20.0 ttl pk-yrs)    Types: Cigarettes    Start date: 09/15/1975    Last attempt to quit: 09/15/2015    Years since quitting: 8.6    Passive exposure: Current   Smokeless tobacco: Never  Substance Use Topics   Alcohol use: Never   Family History  Problem Relation Age of  Onset   Hypertension Father    Heart disease Father    Heart attack Father    Prostate cancer Neg Hx    Breast cancer Neg Hx    Colon cancer Neg Hx    Pancreatic cancer Neg Hx     ROS  Review of Systems  Cardiovascular:  Positive for chest pain. Negative for dyspnea on exertion and leg swelling.   Objective      05/02/2024   11:00 AM 11/15/2023    8:28 AM 05/16/2023    8:43 AM  Vitals with BMI  Height  5' 11 5' 11  Weight 207 lbs 209 lbs 11 oz 214 lbs 14 oz  BMI  29.26 29.99  Systolic  122 124  Diastolic  84 88  Pulse  96 98      Physical Exam Constitutional:      Appearance: He is well-developed. He is diaphoretic.  Neck:     Vascular: No carotid bruit or JVD.  Cardiovascular:     Rate and Rhythm: Normal rate and regular rhythm.     Pulses: Intact distal pulses.     Heart sounds: Normal heart sounds. No murmur heard.    No gallop.  Pulmonary:     Effort: Pulmonary effort is normal.     Breath sounds: Normal breath sounds.  Abdominal:     General: Bowel sounds are normal.  Palpations: Abdomen is soft.  Musculoskeletal:     Right lower leg: No edema.     Left lower leg: No edema.    Laboratory examination:   Recent Labs    11/08/23 0858 05/02/24 1220  NA 139 136  K 4.2 3.3*  CL 103 105  CO2 23 18*  GLUCOSE 101* 152*  BUN 12 13  CREATININE 0.88 0.89  CALCIUM  9.6 8.7*  GFRNONAA  --  >60   estimated creatinine clearance is 92.9 mL/min (by C-G formula based on SCr of 0.89 mg/dL).      Latest Ref Rng & Units 05/02/2024   12:20 PM 11/08/2023    8:58 AM 11/07/2022    8:34 AM  CMP  Glucose 70 - 99 mg/dL 847  898  883   BUN 8 - 23 mg/dL 13  12  19    Creatinine 0.61 - 1.24 mg/dL 9.10  9.11  8.98   Sodium 135 - 145 mmol/L 136  139  137   Potassium 3.5 - 5.1 mmol/L 3.3  4.2  4.7   Chloride 98 - 111 mmol/L 105  103  102   CO2 22 - 32 mmol/L 18  23  20    Calcium  8.9 - 10.3 mg/dL 8.7  9.6  9.4   Total Protein 6.5 - 8.1 g/dL 5.9  6.4  6.3   Total  Bilirubin 0.0 - 1.2 mg/dL 0.6  0.5  0.4   Alkaline Phos 38 - 126 U/L 46  64  54   AST 15 - 41 U/L 22  13  14    ALT 0 - 44 U/L 14  12  13        Latest Ref Rng & Units 05/02/2024   12:20 PM 11/08/2023    8:58 AM 11/07/2022    8:34 AM  CBC  WBC 4.0 - 10.5 K/uL 11.7  7.1  6.3   Hemoglobin 13.0 - 17.0 g/dL 87.3  85.8  86.0   Hematocrit 39.0 - 52.0 % 37.3  42.5  40.9   Platelets 150 - 400 K/uL 328  350  333    Lipid Panel     Component Value Date/Time   CHOL 186 05/02/2024 1220   CHOL 216 (H) 11/08/2023 0858   TRIG 146 05/02/2024 1220   HDL 38 (L) 05/02/2024 1220   HDL 44 11/08/2023 0858   CHOLHDL 4.9 05/02/2024 1220   VLDL 29 05/02/2024 1220   LDLCALC 119 (H) 05/02/2024 1220   LDLCALC 140 (H) 11/08/2023 0858   HEMOGLOBIN A1C Lab Results  Component Value Date   HGBA1C 5.7 (H) 05/02/2024   MPG 116.89 05/02/2024   TSH Recent Labs    11/08/23 0858  TSH 2.530   BNP (last 3 results) No results for input(s): BNP in the last 8760 hours. Cardiac Panel (last 3 results) Recent Labs    05/02/24 1220  TROPONINIHS 695*     Medications and allergies  No Known Allergies   Prior to Admission medications   Medication Sig Start Date End Date Taking? Authorizing Provider  XTANDI 40 MG capsule Take 80 mg by mouth daily. 10/17/22   [provider]     norepinephrine  (LEVOPHED ) 4 mg in dextrose  5 % 250 mL (0.016 mg/mL) infusion Stopped (05/02/24 1337)   tirofiban  Stopped (05/02/24 1324)    Current Outpatient Medications  Medication Instructions   Xtandi 80 mg, Daily    No intake/output data recorded. No intake/output data recorded.    Radiology:  No results found.  Cardiac Studies:   EMS EKG 05/02/2024 at 11:22 AM: Normal sinus rhythm, ST elevation in V3-V5 meets STEMI criteria.  Assessment   1.  Acute anterolateral STEMI 2.  Hypercholesterolemia 3.  Hyperglycemia 4.  Precardiogenic shock 5.  Tobacco use disorder  Recommendations:   Patient is  emergently being taken to the cardiac catheterization lab to evaluate his coronary anatomy, patient with ongoing chest pain, blood pressure is very soft around 80 to 85 mmHg, will make further recommendations following cardiac catheterization.  Will address his risk factors including his lipids, hyperglycemia and also smoking.  He is full code    Gordy Bergamo, MD, [facility] 05/02/2024, 1:41 PM [facility] [address] [place], [place] [address] Phone: [phone]. Fax:  [phone]

## 2024-05-02 NOTE — H&P (Addendum)
 Advanced Heart Failure Team History and Physical Note   PCP:  de [place], Quintin PARAS, MD  PCP-Cardiology: None     Reason for Admission: Anterior STEMI and cardiogenic shock   HPI:   [name] is a 68 y.o. male with history of prostate cancer s/p prostatectomy, longstanding tobacco use, prediabetes, hyperlipidemia. No prior cardiac history.   Presented today with anterior STEMI. Developed throat discomfort, shortness of breath and diaphoresis while at work. Shortly after onset a coworker noticed he appeared ill and called EMS. ECG with anterior ST elevation. Code STEMI was activated and he was taken to cath lab emergently. Found to have occluded p LAD treated with PCI/DES. Also noted to have residsual LCX and RCA disease. LVEDP 27 mmHg. He was hypotensive during the case and started on norepinephrine . RHC consistent with cardiogenic shock. IABP placed and he was transferred to the ICU for further management.   Home Medications Prior to Admission medications   Medication Sig Start Date End Date Taking? Authorizing Provider  XTANDI 40 MG capsule Take 80 mg by mouth daily. 10/17/22   [provider]    Past Medical History: Past Medical History:  Diagnosis Date   Cellulitis of neck 12/13/2021   Headache    Migraines   Prostate cancer (HCC)    Skin cancer screening 12/13/2021    Past Surgical History: Past Surgical History:  Procedure Laterality Date   CERVICAL DISCECTOMY     CYST EXCISION  1976   Spine   LYMPHADENECTOMY Bilateral 04/05/2018   Procedure: LYMPHADENECTOMY;  Surgeon: Alvaro Hummer, MD;  Location: [facility];  Service: Urology;  Laterality: Bilateral;   PROSTATE BIOPSY     ROBOT ASSISTED LAPAROSCOPIC RADICAL PROSTATECTOMY N/A 04/05/2018   Procedure: XI ROBOTIC ASSISTED LAPAROSCOPIC RADICAL PROSTATECTOMY;  Surgeon: Alvaro Hummer, MD;  Location: [facility];  Service: Urology;  Laterality: N/A;   TONSILLECTOMY      Family History:  Family History  Problem  Relation Age of Onset   Hypertension Father    Heart disease Father    Heart attack Father    Prostate cancer Neg Hx    Breast cancer Neg Hx    Colon cancer Neg Hx    Pancreatic cancer Neg Hx     Social History: Social History   Socioeconomic History   Marital status: Married    Spouse name: Not on file   Number of children: 3   Years of education: Not on file   Highest education level: Not on file  Occupational History    Comment: working full time   Tobacco Use   Smoking status: Some Days    Current packs/day: 0.00    Average packs/day: 0.5 packs/day for 40.0 years (20.0 ttl pk-yrs)    Types: Cigarettes    Start date: 09/15/1975    Last attempt to quit: 09/15/2015    Years since quitting: 8.6    Passive exposure: Current   Smokeless tobacco: Never  Vaping Use   Vaping status: Never Used  Substance and Sexual Activity   Alcohol use: Never   Drug use: Never   Sexual activity: Not Currently  Other Topics Concern   Not on file  Social History Narrative   Not on file   Social Drivers of Health   Financial Resource Strain: Low Risk  (11/15/2023)   Overall Financial Resource Strain (CARDIA)    Difficulty of Paying Living Expenses: Not hard at all  Food Insecurity: No Food Insecurity (11/15/2023)   Hunger Vital  Sign    Worried About Programme researcher, broadcasting/film/video in the Last Year: Never true    Ran Out of Food in the Last Year: Never true  Transportation Needs: No Transportation Needs (11/15/2023)   PRAPARE - Administrator, Civil Service (Medical): No    Lack of Transportation (Non-Medical): No  Physical Activity: Insufficiently Active (11/15/2023)   Exercise Vital Sign    Days of Exercise per Week: 3 days    Minutes of Exercise per Session: 20 min  Stress: No Stress Concern Present (11/15/2023)   [employer] of Occupational Health - Occupational Stress Questionnaire    Feeling of Stress : Not at all  Social Connections: Moderately Isolated (11/15/2023)   Social  Connection and Isolation Panel    Frequency of Communication with Friends and Family: More than three times a week    Frequency of Social Gatherings with Friends and Family: More than three times a week    Attends Religious Services: Never    Database administrator or Organizations: No    Attends Banker Meetings: Never    Marital Status: Married    Allergies:  No Known Allergies  Objective:    Vital Signs:   SpO2:  [98 %] 98 % (09/19 1202) Weight:  [93.9 kg] 93.9 kg (09/19 1100)   Filed Weights   05/02/24 1100  Weight: 93.9 kg     Physical Exam     General: Exam difficult.  Draped in cath lab. Sedated.   EKG   EMS ECG with anterior ST elevations  Labs     Basic Metabolic Panel: Recent Labs  Lab 05/02/24 1220  NA 136  K 3.3*  CL 105  CO2 18*  GLUCOSE 152*  BUN 13  CREATININE 0.89  CALCIUM  8.7*    Liver Function Tests: Recent Labs  Lab 05/02/24 1220  AST 22  ALT 14  ALKPHOS 46  BILITOT 0.6  PROT 5.9*  ALBUMIN 3.1*   No results for input(s): LIPASE, AMYLASE in the last 168 hours. No results for input(s): AMMONIA in the last 168 hours.  CBC: Recent Labs  Lab 05/02/24 1220  WBC 11.7*  NEUTROABS 8.4*  HGB 12.6*  HCT 37.3*  MCV 93.3  PLT 328    Cardiac Enzymes: No results for input(s): CKTOTAL, CKMB, CKMBINDEX, TROPONINI in the last 168 hours.  BNP: BNP (last 3 results) No results for input(s): BNP in the last 8760 hours.  ProBNP (last 3 results) No results for input(s): PROBNP in the last 8760 hours.   CBG: No results for input(s): GLUCAP in the last 168 hours.  Coagulation Studies: Recent Labs    05/02/24 1220  LABPROT 17.0*  INR 1.3*    Imaging: No results found.   Patient Profile   68 y.o. male with history of prostate cancer s/p prostatectomy, tobacco use, HLD, prediabetes.  Admitted with anterior STEMI c/b cardiogenic shock   Assessment/Plan   Anterior STEMI/CAD -LHC  occluded p LAD treated with PCI/DES. Residual disease in RCA and LCX -HS troponin 695 > trend -DAPT with aspirin  + effient , atorvastatin  80  Cardiogenic shock -In setting of anterior MI -RHC c/w cardiogenic shock: RA mean 15, PA 40/26, PCWP mean 27, PAPi 0.9, Fick CO/CI 3.61/1.69. IABP placed. -On arrival to the ICU, TD CI was up to 2.9, PA 41/19, CVP 9. He is off NE.  -Check lactic acid and CO-OX -Awaiting echo -May be able to remove balloon pump today  Hyperlipidemia -Start Atorvastatin   80 -Check lipid panel  Prostate cancer  -S/p prostatectomy -On Xtandi  Tobacco use -Recommend smoking cessation   Jeania Nater N, PA-C 05/02/2024, 1:37 PM  Total Time Spent 30 minutes  Advanced Heart Failure Team Pager [phone] (M-F; 7a - 5p)  Please contact [facility] for night-coverage after hours (4p -7a ) and weekends on [email or web address]

## 2024-05-02 NOTE — Care Management (Signed)
 Transition of Care [facility]) - Inpatient Brief Assessment   Patient Details  Name: [name] MRN: [id number] Date of Birth: 05/02/1956  Transition of Care [facility]) CM/SW Contact:    Corean JAYSON Canary, RN Phone Number: 05/02/2024, 2:52 PM   Clinical Narrative:   Mr. Gurka is a 68 y.o. male with history of prostate cancer s/p prostatectomy, longstanding tobacco use, prediabetes, hyperlipidemia. No prior cardiac history. Presented with diaphoresis, jaw pain. STEMI had occluded LAD which was PCI and DES. He was felt to be in cardiac shock and a IABP was placed. He is currently in the ICU on pressors.   IP care management will follow for needs, such as post cardiac rehab or home health, and recommendations for transition of care   Transition of Care Asessment: Insurance and Status: Insurance coverage has been reviewed Patient has primary care physician: Yes   Prior level of function:: Independent working Forensic psychologist: No current home services Social Drivers of Health Review: SDOH reviewed no interventions necessary Readmission risk has been reviewed: Yes Transition of care needs: no transition of care needs at this time

## 2024-05-02 NOTE — Progress Notes (Signed)
 PHARMACY - ANTICOAGULATION CONSULT NOTE  Pharmacy Consult for IV heparin  Indication: IABP  No Known Allergies  Patient Measurements: Height: 5' 11 (180.3 cm) Weight: 102.2 kg (225 lb 5 oz) IBW/kg (Calculated) : 75.3 HEPARIN  DW (KG): 96.5  Vital Signs: BP: 130/89 (09/19 1339) Pulse Rate: 0 (09/19 1344)  Labs: Recent Labs    05/02/24 1220 05/02/24 1254  HGB 12.6* 12.2*  12.2*  HCT 37.3* 36.0*  36.0*  PLT 328  --   APTT >200*  --   LABPROT 17.0*  --   INR 1.3*  --   CREATININE 0.89  --   TROPONINIHS 695*  --     Estimated Creatinine Clearance: 96.7 mL/min (by C-G formula based on SCr of 0.89 mg/dL).   Medical History: Past Medical History:  Diagnosis Date   Cellulitis of neck 12/13/2021   Headache    Migraines   Prostate cancer (HCC)    Skin cancer screening 12/13/2021    Medications:  Infusions:   sodium chloride       Assessment: 68 yo male s/p STEMI on IABP, likely just overnight.  Pharmacy asked to add heparin  while IABP in place.  No current bleeding or complications noted.  Goal of Therapy:  IABP goal 0.2-0.5 Monitor platelets by anticoagulation protocol: Yes   Plan:  Start IV heparin  at 1000 units/hr Check heparin  level in 8 hrs. Daily heparin  level and CBC.  Andre Cowan, Andre Cowan, BCCP Clinical Pharmacist  05/02/2024 4:04 PM   [facility] pharmacy phone numbers are listed on [email or web address]

## 2024-05-02 NOTE — Progress Notes (Signed)
 Noted to have episodes of transient sinus bradycardia to 40s associated with hypotension. Then requires pressor support transiently. Seems he had been bearing down and holding his breath during most recent episode. Suspect may be vasovagal in nature.  On my reassessment rhythm is sinus 60s and MAP > 70 off norepinephrine .  Can start epinephrine  at 2 mcg/min if he has recurrent episodes of bradycardia.

## 2024-05-02 NOTE — Progress Notes (Signed)
 12-lead EKG performed.  Critical value noted.  Ole, RN, notified.

## 2024-05-03 ENCOUNTER — Inpatient Hospital Stay (HOSPITAL_COMMUNITY)

## 2024-05-03 LAB — CBC
HCT: 37.5 % — ABNORMAL LOW (ref 39.0–52.0)
HCT: 37 % — ABNORMAL LOW (ref 39.0–52.0)
Hemoglobin: 12.4 g/dL — ABNORMAL LOW (ref 13.0–17.0)
Hemoglobin: 12.1 g/dL — ABNORMAL LOW (ref 13.0–17.0)
MCH: 31.4 pg (ref 26.0–34.0)
MCH: 31.5 pg (ref 26.0–34.0)
MCHC: 33.1 g/dL (ref 30.0–36.0)
MCHC: 32.7 g/dL (ref 30.0–36.0)
MCV: 94.9 fL (ref 80.0–100.0)
MCV: 96.4 fL (ref 80.0–100.0)
Platelets: 292 K/uL (ref 150–400)
Platelets: 242 K/uL (ref 150–400)
RBC: 3.95 MIL/uL — ABNORMAL LOW (ref 4.22–5.81)
RBC: 3.84 MIL/uL — ABNORMAL LOW (ref 4.22–5.81)
RDW: 12.9 % (ref 11.5–15.5)
RDW: 13.2 % (ref 11.5–15.5)
WBC: 14.4 K/uL — ABNORMAL HIGH (ref 4.0–10.5)
WBC: 14.2 K/uL — ABNORMAL HIGH (ref 4.0–10.5)
nRBC: 0 % (ref 0.0–0.2)
nRBC: 0 % (ref 0.0–0.2)

## 2024-05-03 LAB — COOXEMETRY PANEL
Carboxyhemoglobin: 1.8 % — ABNORMAL HIGH (ref 0.5–1.5)
Carboxyhemoglobin: 1.4 % (ref 0.5–1.5)
Carboxyhemoglobin: 1.6 % — ABNORMAL HIGH (ref 0.5–1.5)
Carboxyhemoglobin: 1.4 % (ref 0.5–1.5)
Methemoglobin: <0.7 % (ref 0.0–1.5)
Methemoglobin: <0.7 % (ref 0.0–1.5)
Methemoglobin: <0.7 % (ref 0.0–1.5)
Methemoglobin: <0.7 % (ref 0.0–1.5)
O2 Saturation: 78.8 %
O2 Saturation: 65.3 %
O2 Saturation: 62.1 %
O2 Saturation: 58.6 %
Total hemoglobin: 13.1 g/dL (ref 12.0–16.0)
Total hemoglobin: 13 g/dL (ref 12.0–16.0)
Total hemoglobin: 13.1 g/dL (ref 12.0–16.0)
Total hemoglobin: 13 g/dL (ref 12.0–16.0)

## 2024-05-03 LAB — COMPREHENSIVE METABOLIC PANEL WITH GFR
ALT: 49 U/L — ABNORMAL HIGH (ref 0–44)
ALT: 42 U/L (ref 0–44)
AST: 282 U/L — ABNORMAL HIGH (ref 15–41)
AST: 173 U/L — ABNORMAL HIGH (ref 15–41)
Albumin: 3.1 g/dL — ABNORMAL LOW (ref 3.5–5.0)
Albumin: 3 g/dL — ABNORMAL LOW (ref 3.5–5.0)
Alkaline Phosphatase: 44 U/L (ref 38–126)
Alkaline Phosphatase: 40 U/L (ref 38–126)
Anion gap: 11 (ref 5–15)
Anion gap: 10 (ref 5–15)
BUN: 14 mg/dL (ref 8–23)
BUN: 10 mg/dL (ref 8–23)
CO2: 19 mmol/L — ABNORMAL LOW (ref 22–32)
CO2: 20 mmol/L — ABNORMAL LOW (ref 22–32)
Calcium: 8.4 mg/dL — ABNORMAL LOW (ref 8.9–10.3)
Calcium: 8.3 mg/dL — ABNORMAL LOW (ref 8.9–10.3)
Chloride: 105 mmol/L (ref 98–111)
Chloride: 104 mmol/L (ref 98–111)
Creatinine, Ser: 0.77 mg/dL (ref 0.61–1.24)
Creatinine, Ser: 0.78 mg/dL (ref 0.61–1.24)
GFR, Estimated: >60 mL/min (ref >60)
GFR, Estimated: >60 mL/min (ref >60)
Glucose, Bld: 125 mg/dL — ABNORMAL HIGH (ref 70–99)
Glucose, Bld: 119 mg/dL — ABNORMAL HIGH (ref 70–99)
Potassium: 4.1 mmol/L (ref 3.5–5.1)
Potassium: 3.6 mmol/L (ref 3.5–5.1)
Sodium: 135 mmol/L (ref 135–145)
Sodium: 134 mmol/L — ABNORMAL LOW (ref 135–145)
Total Bilirubin: 0.8 mg/dL (ref 0.0–1.2)
Total Bilirubin: 0.7 mg/dL (ref 0.0–1.2)
Total Protein: 5.9 g/dL — ABNORMAL LOW (ref 6.5–8.1)
Total Protein: 5.9 g/dL — ABNORMAL LOW (ref 6.5–8.1)

## 2024-05-03 LAB — CG4 I-STAT (LACTIC ACID)
Lactic Acid, Venous: 1.1 mmol/L (ref 0.5–1.9)
Lactic Acid, Venous: 2.5 mmol/L (ref 0.5–1.9)
Lactic Acid, Venous: 2.7 mmol/L (ref 0.5–1.9)
Lactic Acid, Venous: 4.6 mmol/L (ref 0.5–1.9)

## 2024-05-03 LAB — POCT ACTIVATED CLOTTING TIME
Activated Clotting Time: 130 s
Activated Clotting Time: 112 s

## 2024-05-03 LAB — MAGNESIUM
Magnesium: 1.9 mg/dL (ref 1.7–2.4)
Magnesium: 2.6 mg/dL — ABNORMAL HIGH (ref 1.7–2.4)

## 2024-05-03 LAB — HEPARIN LEVEL (UNFRACTIONATED)
Heparin Unfractionated: 0.12 [IU]/mL — ABNORMAL LOW (ref 0.30–0.70)
Heparin Unfractionated: 0.1 [IU]/mL — ABNORMAL LOW (ref 0.30–0.70)

## 2024-05-03 MED ORDER — HEPARIN (PORCINE) 25000 UT/250ML-% IV SOLN
1500.0000 [IU]/h | INTRAVENOUS | Status: DC
  Administered 2024-05-03 (×2): 1300 [IU]/h via INTRAVENOUS
  Filled 2024-05-03: qty 250

## 2024-05-03 MED ORDER — MAGNESIUM SULFATE 2 GM/50ML IV SOLN
2.0000 g | Freq: Once | INTRAVENOUS | Status: AC
Start: 2024-05-03 — End: 2024-05-03
  Administered 2024-05-03: 2 g via INTRAVENOUS
  Filled 2024-05-03: qty 50

## 2024-05-03 MED ORDER — ORAL CARE MOUTH RINSE: 15.0000 mL | OROMUCOSAL | Status: DC | PRN

## 2024-05-03 MED ORDER — INFLUENZA VAC SPLIT HIGH-DOSE 0.5 ML IM SUSY
0.5000 mL | PREFILLED_SYRINGE | INTRAMUSCULAR | Status: AC
  Administered 2024-05-05: 0.5 mL via INTRAMUSCULAR
  Filled 2024-05-03 (×2): qty 0.5

## 2024-05-03 MED ORDER — ATROPINE SULFATE 1 MG/10ML IJ SOSY
PREFILLED_SYRINGE | INTRAMUSCULAR | Status: AC
  Filled 2024-05-03: qty 10

## 2024-05-03 MED ORDER — SODIUM CHLORIDE 0.9 % IV SOLN
2.0000 g | INTRAVENOUS | Status: DC
  Administered 2024-05-03 – 2024-05-05 (×3): 2 g via INTRAVENOUS
  Filled 2024-05-03 (×3): qty 20

## 2024-05-03 MED ORDER — POTASSIUM CHLORIDE CRYS ER 20 MEQ PO TBCR
40.0000 meq | EXTENDED_RELEASE_TABLET | Freq: Once | ORAL | Status: AC
  Administered 2024-05-03: 40 meq via ORAL
  Filled 2024-05-03: qty 2

## 2024-05-03 MED ORDER — SODIUM CHLORIDE 0.9 % IV SOLN: INTRAVENOUS | Status: AC | PRN

## 2024-05-03 MED ORDER — ACETAMINOPHEN 325 MG PO TABS: 650.0000 mg | ORAL_TABLET | ORAL | Status: DC | PRN

## 2024-05-03 NOTE — Progress Notes (Signed)
 Advanced Heart Failure Progress Note   PCP:  [place], Quintin PARAS, MD  PCP-Cardiology: None     CC: Anterior STEMI / Cardiogenic Shock   Interval hx  - No acute events overnight.  - Fatigued this morning / uncomfortable laying supine Objective:    Vital Signs:   Temp:  [94.1 F (34.5 C)-99.7 F (37.6 C)] 99.7 F (37.6 C) (09/20 0852) Pulse Rate:  [0-244] 236 (09/20 0852) Resp:  [10-23] 22 (09/20 0852) BP: (83-156)/(21-91) 132/83 (09/20 0800) SpO2:  [85 %-100 %] 95 % (09/20 0852) Weight:  [93.9 kg-102.2 kg] 102.2 kg (09/19 1500) Last BM Date : 05/02/24 Filed Weights   05/02/24 1100 05/02/24 1500  Weight: 93.9 kg 102.2 kg     Physical Exam     Vitals:   05/03/24 0900 05/03/24 0915  BP: 112/85   Pulse: 87 89  Resp: 18 17  Temp: 99.7 F (37.6 C) 99.7 F (37.6 C)  SpO2: 93% 94%   GENERAL: NAD Lungs- normal work of breathing CARDIAC:          Normal rate with regular rhythm. no murmur.  Pulses 2+. No edema.  ABDOMEN: Soft, non-tender, non-distended.  EXTREMITIES: Warm and well perfused.  NEUROLOGIC: No obvious FND   EKG   EMS ECG with anterior ST elevations  Labs     Basic Metabolic Panel: Recent Labs  Lab 05/02/24 1220 05/02/24 1254 05/02/24 1459 05/02/24 1709 05/03/24 0530  NA 136 138  138  --  137 135  K 3.3* 4.0  3.9  --  4.2 4.1  CL 105  --   --  106 105  CO2 18*  --   --  19* 19*  GLUCOSE 152*  --   --  159* 125*  BUN 13  --   --  15 14  CREATININE 0.89  --   --  0.75 0.77  CALCIUM  8.7*  --   --  8.6* 8.4*  MG  --   --  2.0 1.9  --     Liver Function Tests: Recent Labs  Lab 05/02/24 1220 05/02/24 1709 05/03/24 0530  AST 22 452* 282*  ALT 14 54* 49*  ALKPHOS 46 48 44  BILITOT 0.6 0.5 0.8  PROT 5.9* 6.1* 5.9*  ALBUMIN 3.1* 3.3* 3.1*   No results for input(s): LIPASE, AMYLASE in the last 168 hours. No results for input(s): AMMONIA in the last 168 hours.  CBC: Recent Labs  Lab 05/02/24 1220 05/02/24 1254  05/02/24 1709 05/03/24 0530  WBC 11.7*  --  12.9* 14.4*  NEUTROABS 8.4*  --   --   --   HGB 12.6* 12.2*  12.2* 12.8* 12.4*  HCT 37.3* 36.0*  36.0* 38.6* 37.5*  MCV 93.3  --  96.0 94.9  PLT 328  --  323 292    CBG: Recent Labs  Lab 05/02/24 1509  GLUCAP 127*    Coagulation Studies: Recent Labs    05/02/24 1220  LABPROT 17.0*  INR 1.3*    Imaging: ECHOCARDIOGRAM COMPLETE Result Date: 05/02/2024    ECHOCARDIOGRAM REPORT   Patient Name:   Andre Cowan Date of Exam: 05/02/2024 Medical Rec #:  [id number]      Height:       71.0 in Accession #:    [id number]     Weight:       225.3 lb Date of Birth:  1955-09-21     BSA:  2.218 m Patient Age:    68 years       BP:           130/89 mmHg Patient Gender: M              HR:           59 bpm. Exam Location:  Inpatient Procedure: 2D Echo, Cardiac Doppler, Color Doppler and Intracardiac            Opacification Agent (Both Spectral and Color Flow Doppler were            utilized during procedure). Indications:    CHF-Acute Systolic I50.21  History:        Patient has no prior history of Echocardiogram examinations.                 Acute MI; Risk Factors:Dyslipidemia and Current Smoker.  Sonographer:    Thea Norlander RCS Referring Phys: MANUELITA GARRE [facility] IMPRESSIONS  1. Only basal function preserved mid/distal septum, anterior wall and apex akinetic inferior apical hypokinesis With definity  rouleau formation pre thrombus. Patient would likey benefit from a period of post MI anticoagulation. Left ventricular ejection  fraction, by estimation, is 25%. The left ventricle has normal function. The left ventricle demonstrates regional wall motion abnormalities (see scoring diagram/findings for description). The left ventricular internal cavity size was moderately dilated.  There is mild left ventricular hypertrophy. Left ventricular diastolic parameters are indeterminate.  2. ? catheter in RV. Right ventricular systolic function is normal. The  right ventricular size is normal.  3. A small pericardial effusion is present. The pericardial effusion is anterior to the right ventricle.  4. The mitral valve is normal in structure. No evidence of mitral valve regurgitation. No evidence of mitral stenosis.  5. The aortic valve was not well visualized. Aortic valve regurgitation is not visualized. No aortic stenosis is present.  6. The inferior vena cava is dilated in size with >50% respiratory variability, suggesting right atrial pressure of 8 mmHg. FINDINGS  Left Ventricle: Only basal function preserved mid/distal septum, anterior wall and apex akinetic inferior apical hypokinesis With definity  rouleau formation pre thrombus. Patient would likey benefit from a period of post MI anticoagulation. Left ventricular ejection fraction, by estimation, is 25%. The left ventricle has normal function. The left ventricle demonstrates regional wall motion abnormalities. Definity  contrast agent was given IV to delineate the left ventricular endocardial borders. Strain was performed and the global longitudinal strain is indeterminate. The left ventricular internal cavity size was moderately dilated. There is mild left ventricular hypertrophy. Left ventricular diastolic parameters are indeterminate. Right Ventricle: ? catheter in RV. The right ventricular size is normal. No increase in right ventricular wall thickness. Right ventricular systolic function is normal. Left Atrium: Left atrial size was normal in size. Right Atrium: Right atrial size was normal in size. Pericardium: A small pericardial effusion is present. The pericardial effusion is anterior to the right ventricle. Mitral Valve: The mitral valve is normal in structure. No evidence of mitral valve regurgitation. No evidence of mitral valve stenosis. Tricuspid Valve: The tricuspid valve is normal in structure. Tricuspid valve regurgitation is not demonstrated. No evidence of tricuspid stenosis. Aortic Valve: The  aortic valve was not well visualized. Aortic valve regurgitation is not visualized. No aortic stenosis is present. Aortic valve peak gradient measures 6.0 mmHg. Pulmonic Valve: The pulmonic valve was normal in structure. Pulmonic valve regurgitation is not visualized. No evidence of pulmonic stenosis. Aorta: The aortic root is normal  in size and structure. Venous: The inferior vena cava is dilated in size with greater than 50% respiratory variability, suggesting right atrial pressure of 8 mmHg. IAS/Shunts: No atrial level shunt detected by color flow Doppler. Additional Comments: 3D was performed not requiring image post processing on an independent workstation and was indeterminate.  LEFT VENTRICLE PLAX 2D LVIDd:         4.30 cm   Diastology LVIDs:         2.80 cm   LV e' medial:    6.42 cm/s LV PW:         1.40 cm   LV E/e' medial:  6.3 LV IVS:        1.20 cm   LV e' lateral:   8.81 cm/s LVOT diam:     2.00 cm   LV E/e' lateral: 4.6 LV SV:         64 LV SV Index:   29 LVOT Area:     3.14 cm  RIGHT VENTRICLE             IVC RV S prime:     13.10 cm/s  IVC diam: 2.60 cm TAPSE (M-mode): 1.9 cm LEFT ATRIUM             Index        RIGHT ATRIUM          Index LA diam:        3.20 cm 1.44 cm/m   RA Area:     9.32 cm LA Vol (A2C):   37.9 ml 17.09 ml/m  RA Volume:   17.10 ml 7.71 ml/m LA Vol (A4C):   21.9 ml 9.87 ml/m LA Biplane Vol: 29.1 ml 13.12 ml/m  AORTIC VALVE AV Area (Vmax): 3.06 cm AV Vmax:        122.00 cm/s AV Peak Grad:   6.0 mmHg LVOT Vmax:      119.00 cm/s LVOT Vmean:     70.100 cm/s LVOT VTI:       0.203 m  AORTA Ao Root diam: 3.60 cm Ao Asc diam:  3.00 cm MITRAL VALVE MV Area (PHT): 4.15 cm    SHUNTS MV Decel Time: 183 msec    Systemic VTI:  0.20 m MV E velocity: 40.20 cm/s  Systemic Diam: 2.00 cm MV A velocity: 59.10 cm/s MV E/A ratio:  0.68 Maude Emmer MD Electronically signed by Maude Emmer MD Signature Date/Time: 05/02/2024/6:10:32 PM    Final    DG CHEST PORT 1 VIEW Result Date:  05/02/2024 EXAM: 1 VIEW XRAY OF THE CHEST 05/02/2024 04:06:00 PM COMPARISON: None available. CLINICAL HISTORY: Encounter for management of intra-aortic balloon pump. FINDINGS: LINES, TUBES AND DEVICES: Swan-Ganz catheter via femoral approach terminates in right lower lobe pulmonary artery. External wires and leads noted. Intraaortic balloon pump marker is not confidently identified, and may be obscured by cardiac leads. LUNGS AND PLEURA: No focal pulmonary opacity. No pulmonary edema. No pleural effusion. No pneumothorax. HEART AND MEDIASTINUM: No acute abnormality of the cardiac and mediastinal silhouettes. ACDF hardware in lower cervical spine. BONES AND SOFT TISSUES: No acute osseous abnormality. IMPRESSION: 1. Swan-Ganz catheter via femoral approach terminates in the right lower lobe pulmonary artery. 2. Intraaortic balloon pump marker is not confidently identified, possibly obscured by cardiac leads. 3. ACDF hardware in the lower cervical spine. Electronically signed by: Waddell Calk MD 05/02/2024 04:24 PM EDT RP Workstation: [id number]   CARDIAC CATHETERIZATION Result Date: 05/02/2024 Images from the original result were not included.  Cardiac Catheterization 05/02/24: Hemodynamic data: RA 18/EDP 18 mmHg. PA 40/26, mean 31 mmHg.  PA saturation 51%.  AO saturation 97%. RV 47/16, EDP 18 mmHg. PW 29/28, mean 27 mmHg. QP/QS 1.0.  CO 3.61, CI 1.69.  By Denney.  PAPi 0.9. LV 81/17, EDP 27 mmHg.  AO x 1 100/50, mean 71 mmHg.  No pressure gradient across the aortic valve. Angiographic data: LV: Entire anterolateral wall akinetic, EF 25 to 30%. LM: Large-caliber vessel and mildly calcified. LAD: Gives origin to a moderate-sized D1 and a small to moderate-sized D2, proximal LAD is occluded, it is moderately calcified.  Mid to distal LAD has a 30% stenosis. RI: Moderate caliber vessel with a high-grade tandem 80 to 95% stenosis in the midsegment. Cx: Large-caliber vessel and a dominant vessel.  Gives origin to large OM1  which is tandem 80 to 90% stenosis after the origin of OM1, mid Cx has a eccentric 70% stenosis.  Gives origin to moderate-sized PL branch distally and a large RPDA which are disease-free. RCA: Nondominant.  Relatively large vessel.  Mid segment has a focal 90% stenosis followed by diffuse 80% long segment stenosis. Intervention data: Successful PTCA and stenting of the proximal LAD with implantation of a 3.5 x 38 mm Synergy XD DES, stenosis reduced from 100% to 0% with TIMI 0 to TIMI-3 flow.  No sidebranch compromise. Due to markedly elevated EDP, need for pressor support, right heart catheterization was performed and noted to have low cardiac index and surprisingly PAPi was also low bedside echo revealing normal RV function.  Hence decided to proceed with placement of IABP, patient was able to wean off of norepinephrine  at the end of the procedure with stable hemodynamics. Impression and recommendations: Patient will need aggressive risk factor modification, presently not on any beta-blockers or ACE inhibitors due to cardiogenic shock, continue IABP support, appreciate the help of Dr. Gwynne Commander during cardiac cath. He will continue to follow and manage his hemodynamics.  Patient was started on aspirin  and was loaded with Effient .  Patient also received loading dose of Aggrastat  which was also given intracoronary as there was thrombus in the midsegment of the LAD with complete resolution.     Patient Profile   68 y.o. male with history of prostate cancer s/p prostatectomy, tobacco use, HLD, prediabetes s/p PCI to the LAD for anterior STEMI requiring IABP support.   Assessment/Plan   SCAI C Cardiogenic shock 2/2 Anterior STEMI -LHC occluded p LAD treated with PCI/DES. Residual disease in RCA and LCX -DAPT with aspirin  + effient , atorvastatin  80 -PA 28/11, CVP 4, TD CI 2.27 L/min/m2, SVR 1200. eFick CI of 2.3 L/min/m2 - Rise in lactic acid to 5.6 overnight; hemodynamics were unchanged. After  stopping epi 2mcg lactic acid resolved. Rise in lactate again this AM with rising WBC ct. Will start rocephin . Discussed with pharmD. In the setting of low filling pressures, I suspect his cardiac index is possibly elevated due to SIRS/sepsis.  - IABP 1:3 now. If mixed venous/TD CI remain stable plan to remove IABP. Can escalate to impella CP support if required. He has been on heparin  now.  - LFTs improving, sCr stable at 0.77. WBC ct up to 14.4  Anterior STEMI / CAD - S/P PCI to the LAD on by Dr. Ladona on 05/02/24.  - Continue DAPT + high intensity statin - Holding BB in the setting of shock and bradycardia yesterday evening.   Hyperlipidemia -continue lipitor  80mg  daily.   Prostate cancer  -S/p prostatectomy -  On Xtandi  Tobacco use -Recommend smoking cessation   Deberah Adolf, DO 05/03/2024, 9:23 AM  Total Time Spent 30 minutes  Advanced Heart Failure Team Pager [phone] (M-F; 7a - 5p)  Please contact [facility] for night-coverage after hours (4p -7a ) and weekends on [email or web address]   CRITICAL CARE Performed by: Ria Commander   Total critical care time: 40 minutes  Critical care time was exclusive of separately billable procedures and treating other patients.  Critical care was necessary to treat or prevent imminent or life-threatening deterioration.  Critical care was time spent personally by me on the following activities: development of treatment plan with patient and/or surrogate as well as nursing, discussions with consultants, evaluation of patient's response to treatment, examination of patient, obtaining history from patient or surrogate, ordering and performing treatments and interventions, ordering and review of laboratory studies, ordering and review of radiographic studies, pulse oximetry and re-evaluation of patient's condition.

## 2024-05-03 NOTE — Plan of Care (Signed)
  Problem: Education: Goal: Knowledge of General Education information will improve Description: Including pain rating scale, medication(s)/side effects and non-pharmacologic comfort measures Outcome: Progressing   Problem: Clinical Measurements: Goal: Ability to maintain clinical measurements within normal limits will improve Outcome: Progressing Goal: Diagnostic test results will improve Outcome: Progressing Goal: Respiratory complications will improve Outcome: Progressing Goal: Cardiovascular complication will be avoided Outcome: Progressing   Problem: Activity: Goal: Risk for activity intolerance will decrease Outcome: Progressing   Problem: Nutrition: Goal: Adequate nutrition will be maintained Outcome: Progressing   Problem: Coping: Goal: Level of anxiety will decrease Outcome: Progressing   Problem: Elimination: Goal: Will not experience complications related to bowel motility Outcome: Progressing Goal: Will not experience complications related to urinary retention Outcome: Progressing   Problem: Pain Managment: Goal: General experience of comfort will improve and/or be controlled Outcome: Progressing   Problem: Safety: Goal: Ability to remain free from injury will improve Outcome: Progressing   Problem: Skin Integrity: Goal: Risk for impaired skin integrity will decrease Outcome: Progressing

## 2024-05-03 NOTE — Plan of Care (Signed)
  Problem: Education: Goal: Knowledge of General Education information will improve Description: Including pain rating scale, medication(s)/side effects and non-pharmacologic comfort measures 05/03/2024 1329 by Lenon Ole CROME, RN Outcome: Progressing 05/03/2024 0816 by Lenon Ole CROME, RN Outcome: Progressing   Problem: Clinical Measurements: Goal: Ability to maintain clinical measurements within normal limits will improve 05/03/2024 1329 by Lenon Ole CROME, RN Outcome: Progressing 05/03/2024 0816 by Lenon Ole CROME, RN Outcome: Progressing Goal: Diagnostic test results will improve 05/03/2024 1329 by Lenon Ole CROME, RN Outcome: Progressing 05/03/2024 0816 by Lenon Ole CROME, RN Outcome: Progressing Goal: Respiratory complications will improve 05/03/2024 1329 by Lenon Ole CROME, RN Outcome: Progressing 05/03/2024 0816 by Lenon Ole CROME, RN Outcome: Progressing Goal: Cardiovascular complication will be avoided 05/03/2024 1329 by Lenon Ole CROME, RN Outcome: Progressing 05/03/2024 0816 by Lenon Ole CROME, RN Outcome: Progressing   Problem: Activity: Goal: Risk for activity intolerance will decrease 05/03/2024 1329 by Lenon Ole CROME, RN Outcome: Progressing 05/03/2024 0816 by Lenon Ole CROME, RN Outcome: Progressing   Problem: Nutrition: Goal: Adequate nutrition will be maintained 05/03/2024 1329 by Lenon Ole CROME, RN Outcome: Progressing 05/03/2024 0816 by Lenon Ole CROME, RN Outcome: Progressing   Problem: Coping: Goal: Level of anxiety will decrease 05/03/2024 1329 by Lenon Ole CROME, RN Outcome: Progressing 05/03/2024 0816 by Lenon Ole CROME, RN Outcome: Progressing   Problem: Elimination: Goal: Will not experience complications related to bowel motility 05/03/2024 1329 by Lenon Ole CROME, RN Outcome: Progressing 05/03/2024 0816 by Lenon Ole CROME, RN Outcome: Progressing Goal: Will not experience  complications related to urinary retention 05/03/2024 1329 by Lenon Ole CROME, RN Outcome: Progressing 05/03/2024 0816 by Lenon Ole CROME, RN Outcome: Progressing   Problem: Pain Managment: Goal: General experience of comfort will improve and/or be controlled 05/03/2024 1329 by Lenon Ole CROME, RN Outcome: Progressing 05/03/2024 0816 by Lenon Ole CROME, RN Outcome: Progressing   Problem: Safety: Goal: Ability to remain free from injury will improve 05/03/2024 1329 by Lenon Ole CROME, RN Outcome: Progressing 05/03/2024 0816 by Lenon Ole CROME, RN Outcome: Progressing   Problem: Skin Integrity: Goal: Risk for impaired skin integrity will decrease 05/03/2024 1329 by Lenon Ole CROME, RN Outcome: Progressing 05/03/2024 0816 by Lenon Ole CROME, RN Outcome: Progressing   Problem: Education: Goal: Understanding of CV disease, CV risk reduction, and recovery process will improve Outcome: Progressing   Problem: Cardiovascular: Goal: Ability to achieve and maintain adequate cardiovascular perfusion will improve Outcome: Progressing Goal: Vascular access site(s) Level 0-1 will be maintained Outcome: Progressing

## 2024-05-03 NOTE — Progress Notes (Signed)
 PHARMACY - ANTICOAGULATION CONSULT NOTE  Pharmacy Consult for IV heparin  Indication: IABP  No Known Allergies  Patient Measurements: Height: 5' 11 (180.3 cm) Weight: 102.2 kg (225 lb 5 oz) IBW/kg (Calculated) : 75.3 HEPARIN  DW (KG): 96.5  Vital Signs: Temp: 99.3 F (37.4 C) (09/20 0300) Temp Source: Core (09/20 0000) BP: 123/26 (09/20 0300) Pulse Rate: 73 (09/20 0300)  Labs: Recent Labs    05/02/24 1220 05/02/24 1254 05/02/24 1500 05/02/24 1709 05/03/24 0153  HGB 12.6* 12.2*  12.2*  --  12.8*  --   HCT 37.3* 36.0*  36.0*  --  38.6*  --   PLT 328  --   --  323  --   APTT >200*  --   --   --   --   LABPROT 17.0*  --   --   --   --   INR 1.3*  --   --   --   --   HEPARINUNFRC  --   --   --   --  0.12*  CREATININE 0.89  --   --  0.75  --   TROPONINIHS 695*  --  >24,000*  --   --     Estimated Creatinine Clearance: 107.6 mL/min (by C-G formula based on SCr of 0.75 mg/dL).   Medical History: Past Medical History:  Diagnosis Date   Cellulitis of neck 12/13/2021   Headache    Migraines   Prostate cancer (HCC)    Skin cancer screening 12/13/2021    Medications:  Infusions:   sodium chloride      epinephrine  Stopped (05/02/24 2106)   heparin  1,000 Units/hr (05/03/24 0200)   norepinephrine  (LEVOPHED ) Adult infusion Stopped (05/02/24 2002)    Assessment: 68 yo male s/p STEMI on IABP, likely just overnight.  Pharmacy asked to add heparin  while IABP in place.  No current bleeding or complications noted.  Heparin  level came back subtherapeutic at 0.12. Increase rate and check level in AM.   Goal of Therapy:  IABP goal 0.2-0.5 Monitor platelets by anticoagulation protocol: Yes   Plan:  Increase heparin  to 1200 units/hr Check heparin  level in 6 hrs. Daily heparin  level and CBC.  Sergio Batch, PharmD, BCIDP, AAHIVP, CPP Infectious Disease Pharmacist 05/03/2024 3:09 AM

## 2024-05-03 NOTE — Progress Notes (Signed)
 Called in for IABP removal. Patient final ACT was 112 at 1259. Dressing was removed, insertion site was assessed; no hematoma was noted. Supple to touch.  Lenon Ole CROME, RN, assisted RN Tobey and stayed with me in the room during the manual hold.  DP was palpated and marked, then Femoral pulse was palpated and patient was instructed to take long slow deep breaths as I was applying pressure and not to hold his breath or bare down.  IABP was pulled at 1412, balloon and sheath were intact upon removal. Manual pressure was held for 32 minutes.  Access site was assessed for hematoma or  active bleeding.  Assess site WNL with 47F sheath still in place with Swan-Ganz catheter in place.  IABP assess site was pressure dressed and Lenon Ole CROME, RN dress the sheath herself.  Patient left in bed supine position with Lenon Ole CROME, RN at bedside.

## 2024-05-03 NOTE — Progress Notes (Signed)
 PHARMACY - ANTICOAGULATION CONSULT NOTE  Pharmacy Consult for IV heparin  Indication: LV thrombus  No Known Allergies  Patient Measurements: Height: 5' 11 (180.3 cm) Weight: 102.2 kg (225 lb 5 oz) IBW/kg (Calculated) : 75.3 HEPARIN  DW (KG): 96.5  Vital Signs: Temp: 99.7 F (37.6 C) (09/20 1430) Temp Source: Core (09/20 1200) BP: 100/74 (09/20 1430) Pulse Rate: 84 (09/20 1430)  Labs: Recent Labs    05/02/24 1220 05/02/24 1254 05/02/24 1500 05/02/24 1709 05/03/24 0153 05/03/24 0530 05/03/24 1000  HGB 12.6* 12.2*  12.2*  --  12.8*  --  12.4*  --   HCT 37.3* 36.0*  36.0*  --  38.6*  --  37.5*  --   PLT 328  --   --  323  --  292  --   APTT >200*  --   --   --   --   --   --   LABPROT 17.0*  --   --   --   --   --   --   INR 1.3*  --   --   --   --   --   --   HEPARINUNFRC  --   --   --   --  0.12*  --  0.10*  CREATININE 0.89  --   --  0.75  --  0.77  --   TROPONINIHS 695*  --  >24,000*  --   --   --   --     Estimated Creatinine Clearance: 107.6 mL/min (by C-G formula based on SCr of 0.77 mg/dL).   Medical History: Past Medical History:  Diagnosis Date   Cellulitis of neck 12/13/2021   Headache    Migraines   Prostate cancer (HCC)    Skin cancer screening 12/13/2021    Medications:  Infusions:   sodium chloride  Stopped (05/03/24 1303)   cefTRIAXone  (ROCEPHIN )  IV Stopped (05/03/24 1105)   heparin  Stopped (05/03/24 1041)   norepinephrine  (LEVOPHED ) Adult infusion Stopped (05/02/24 2002)    Assessment: 68 yo male s/p STEMI on IABP, likely just overnight.  Pharmacy asked to add heparin  while IABP in place.  No current bleeding or complications noted.  Heparin  level 0.1 is subtherapeutic with heparin  running at 1200 units/hr. Hgb (12.4) and PLTs (292) are stable. No titration in dose as IABP was removed this afternoon at 1412. Will restart infusion at higher than previous rate (previously subtherapeutic) 6 hours after IABP pulled for LV thrombus.   Goal of  Therapy:  Heparin  level 0.3-0.5 Monitor platelets by anticoagulation protocol: Yes   Plan:  Restart heparin  infusion at 1300 units/hr at 1830 Check heparin  level in 6 hrs Daily heparin  level and CBC   Thank you for allowing pharmacy to be a part of this patient's care.   Nidia Schaffer, PharmD PGY2 Cardiology Pharmacy Resident  Please check AMION for all [facility] Pharmacy phone numbers After 10:00 PM, call Main Pharmacy [phone] 05/03/2024 3:06 PM

## 2024-05-04 ENCOUNTER — Encounter (HOSPITAL_COMMUNITY): Payer: Self-pay | Admitting: Cardiology

## 2024-05-04 LAB — BASIC METABOLIC PANEL WITH GFR
Anion gap: 8 (ref 5–15)
BUN: 9 mg/dL (ref 8–23)
CO2: 22 mmol/L (ref 22–32)
Calcium: 8.3 mg/dL — ABNORMAL LOW (ref 8.9–10.3)
Chloride: 104 mmol/L (ref 98–111)
Creatinine, Ser: 0.74 mg/dL (ref 0.61–1.24)
GFR, Estimated: >60 mL/min (ref >60)
Glucose, Bld: 111 mg/dL — ABNORMAL HIGH (ref 70–99)
Potassium: 3.9 mmol/L (ref 3.5–5.1)
Sodium: 134 mmol/L — ABNORMAL LOW (ref 135–145)

## 2024-05-04 LAB — CBC
HCT: 35 % — ABNORMAL LOW (ref 39.0–52.0)
Hemoglobin: 11.7 g/dL — ABNORMAL LOW (ref 13.0–17.0)
MCH: 31.9 pg (ref 26.0–34.0)
MCHC: 33.4 g/dL (ref 30.0–36.0)
MCV: 95.4 fL (ref 80.0–100.0)
Platelets: 243 K/uL (ref 150–400)
RBC: 3.67 MIL/uL — ABNORMAL LOW (ref 4.22–5.81)
RDW: 13.1 % (ref 11.5–15.5)
WBC: 12.6 K/uL — ABNORMAL HIGH (ref 4.0–10.5)
nRBC: 0 % (ref 0.0–0.2)

## 2024-05-04 LAB — COOXEMETRY PANEL
Carboxyhemoglobin: 1.7 % — ABNORMAL HIGH (ref 0.5–1.5)
Methemoglobin: <0.7 % (ref 0.0–1.5)
O2 Saturation: 61 %
Total hemoglobin: 12.1 g/dL (ref 12.0–16.0)

## 2024-05-04 LAB — HEPARIN LEVEL (UNFRACTIONATED)
Heparin Unfractionated: 0.14 [IU]/mL — ABNORMAL LOW (ref 0.30–0.70)
Heparin Unfractionated: 0.19 [IU]/mL — ABNORMAL LOW (ref 0.30–0.70)

## 2024-05-04 MED ORDER — GUAIFENESIN-DM 100-10 MG/5ML PO SYRP
5.0000 mL | ORAL_SOLUTION | ORAL | Status: DC | PRN
  Administered 2024-05-04 (×2): 5 mL via ORAL
  Filled 2024-05-04 (×2): qty 5

## 2024-05-04 MED ORDER — CARMEX CLASSIC LIP BALM EX OINT
TOPICAL_OINTMENT | CUTANEOUS | Status: DC | PRN
  Filled 2024-05-04: qty 10

## 2024-05-04 MED ORDER — ASPIRIN 81 MG PO CHEW
81.0000 mg | CHEWABLE_TABLET | Freq: Every day | ORAL | Status: DC
  Administered 2024-05-05: 81 mg via ORAL
  Filled 2024-05-04: qty 1

## 2024-05-04 MED ORDER — POTASSIUM CHLORIDE CRYS ER 20 MEQ PO TBCR
20.0000 meq | EXTENDED_RELEASE_TABLET | Freq: Once | ORAL | Status: AC
  Administered 2024-05-04: 20 meq via ORAL
  Filled 2024-05-04: qty 1

## 2024-05-04 MED ORDER — BENZOCAINE-MENTHOL 6-10 MG MT LOZG
1.0000 | LOZENGE | OROMUCOSAL | Status: DC | PRN
  Administered 2024-05-04: 1 via ORAL
  Filled 2024-05-04 (×2): qty 18

## 2024-05-04 MED ORDER — APIXABAN 5 MG PO TABS
5.0000 mg | ORAL_TABLET | Freq: Two times a day (BID) | ORAL | Status: DC
  Administered 2024-05-04 – 2024-05-05 (×2): 5 mg via ORAL
  Filled 2024-05-04 (×2): qty 1

## 2024-05-04 NOTE — Progress Notes (Signed)
 Advanced Heart Failure Progress Note   PCP:  de [place], Raymond J, MD  PCP-Cardiology: None     CC: Anterior STEMI / Cardiogenic Shock   Interval hx  - IABP removed yesterday - Doing well this morning.  - Mixed venous stable at 61%  Objective:    Vital Signs:   Temp:  [94.1 F (34.5 C)-100.4 F (38 C)] 99.7 F (37.6 C) (09/21 1030) Pulse Rate:  [77-98] 83 (09/21 1030) Resp:  [13-26] 19 (09/21 1030) BP: (76-125)/(33-79) 120/78 (09/21 1030) SpO2:  [89 %-97 %] 95 % (09/21 1030) Last BM Date : 05/02/24 Filed Weights   05/02/24 1100 05/02/24 1500  Weight: 93.9 kg 102.2 kg     Physical Exam     Vitals:   05/04/24 1004 05/04/24 1030  BP:  120/78  Pulse: 84 83  Resp: (!) 26 19  Temp: 99.7 F (37.6 C) 99.7 F (37.6 C)  SpO2: 95% 95%   GENERAL: NAD Lungs- normal work of breathing CARDIAC:          Normal rate with regular rhythm. no murmur.  Pulses 2+. No edema.  ABDOMEN: Soft, non-tender, non-distended.  EXTREMITIES: Warm and well perfused.  NEUROLOGIC: No obvious FND   EKG   EMS ECG with anterior ST elevations  Labs     Basic Metabolic Panel: Recent Labs  Lab 05/02/24 1220 05/02/24 1254 05/02/24 1459 05/02/24 1709 05/03/24 0530 05/03/24 0730 05/03/24 1530 05/04/24 0410  NA 136 138  138  --  137 135  --  134* 134*  K 3.3* 4.0  3.9  --  4.2 4.1  --  3.6 3.9  CL 105  --   --  106 105  --  104 104  CO2 18*  --   --  19* 19*  --  20* 22  GLUCOSE 152*  --   --  159* 125*  --  119* 111*  BUN 13  --   --  15 14  --  10 9  CREATININE 0.89  --   --  0.75 0.77  --  0.78 0.74  CALCIUM  8.7*  --   --  8.6* 8.4*  --  8.3* 8.3*  MG  --   --  2.0 1.9  --  1.9 2.6*  --     Liver Function Tests: Recent Labs  Lab 05/02/24 1220 05/02/24 1709 05/03/24 0530 05/03/24 1530  AST 22 452* 282* 173*  ALT 14 54* 49* 42  ALKPHOS 46 48 44 40  BILITOT 0.6 0.5 0.8 0.7  PROT 5.9* 6.1* 5.9* 5.9*  ALBUMIN 3.1* 3.3* 3.1* 3.0*   No results for input(s): LIPASE,  AMYLASE in the last 168 hours. No results for input(s): AMMONIA in the last 168 hours.  CBC: Recent Labs  Lab 05/02/24 1220 05/02/24 1254 05/02/24 1709 05/03/24 0530 05/03/24 1530 05/04/24 0410  WBC 11.7*  --  12.9* 14.4* 14.2* 12.6*  NEUTROABS 8.4*  --   --   --   --   --   HGB 12.6* 12.2*  12.2* 12.8* 12.4* 12.1* 11.7*  HCT 37.3* 36.0*  36.0* 38.6* 37.5* 37.0* 35.0*  MCV 93.3  --  96.0 94.9 96.4 95.4  PLT 328  --  323 292 242 243    CBG: Recent Labs  Lab 05/02/24 1509  GLUCAP 127*    Coagulation Studies: Recent Labs    05/02/24 1220  LABPROT 17.0*  INR 1.3*    Imaging: DG CHEST PORT 1 VIEW  Result Date: 05/03/2024 CLINICAL DATA:  Management of intra-aortic balloon pump EXAM: PORTABLE CHEST 1 VIEW COMPARISON:  05/02/2024 FINDINGS: Tip of the intra-aortic balloon pump appears to be in the aortic arch. Swan-Ganz catheter via femoral approach is unchanged. Heart and mediastinal contours are within normal limits. No focal opacities or effusions. No acute bony abnormality. Aortic atherosclerosis. IMPRESSION: Intra-aortic balloon pump tip appears to be at the level of the aortic arch. No acute cardiopulmonary disease. Electronically Signed   By: Franky Crease M.D.   On: 05/03/2024 17:14   ECHOCARDIOGRAM COMPLETE Result Date: 05/02/2024    ECHOCARDIOGRAM REPORT   Patient Name:   Andre Cowan Date of Exam: 05/02/2024 Medical Rec #:  [id number]      Height:       71.0 in Accession #:    [id number]     Weight:       225.3 lb Date of Birth:  01-25-1956     BSA:          2.218 m Patient Age:    67 years       BP:           130/89 mmHg Patient Gender: M              HR:           59 bpm. Exam Location:  Inpatient Procedure: 2D Echo, Cardiac Doppler, Color Doppler and Intracardiac            Opacification Agent (Both Spectral and Color Flow Doppler were            utilized during procedure). Indications:    CHF-Acute Systolic I50.21  History:        Patient has no prior history of  Echocardiogram examinations.                 Acute MI; Risk Factors:Dyslipidemia and Current Smoker.  Sonographer:    Thea Norlander RCS Referring Phys: MANUELITA GARRE [facility] IMPRESSIONS  1. Only basal function preserved mid/distal septum, anterior wall and apex akinetic inferior apical hypokinesis With definity  rouleau formation pre thrombus. Patient would likey benefit from a period of post MI anticoagulation. Left ventricular ejection  fraction, by estimation, is 25%. The left ventricle has normal function. The left ventricle demonstrates regional wall motion abnormalities (see scoring diagram/findings for description). The left ventricular internal cavity size was moderately dilated.  There is mild left ventricular hypertrophy. Left ventricular diastolic parameters are indeterminate.  2. ? catheter in RV. Right ventricular systolic function is normal. The right ventricular size is normal.  3. A small pericardial effusion is present. The pericardial effusion is anterior to the right ventricle.  4. The mitral valve is normal in structure. No evidence of mitral valve regurgitation. No evidence of mitral stenosis.  5. The aortic valve was not well visualized. Aortic valve regurgitation is not visualized. No aortic stenosis is present.  6. The inferior vena cava is dilated in size with >50% respiratory variability, suggesting right atrial pressure of 8 mmHg. FINDINGS  Left Ventricle: Only basal function preserved mid/distal septum, anterior wall and apex akinetic inferior apical hypokinesis With definity  rouleau formation pre thrombus. Patient would likey benefit from a period of post MI anticoagulation. Left ventricular ejection fraction, by estimation, is 25%. The left ventricle has normal function. The left ventricle demonstrates regional wall motion abnormalities. Definity  contrast agent was given IV to delineate the left ventricular endocardial borders. Strain was performed and the global longitudinal strain is  indeterminate. The  left ventricular internal cavity size was moderately dilated. There is mild left ventricular hypertrophy. Left ventricular diastolic parameters are indeterminate. Right Ventricle: ? catheter in RV. The right ventricular size is normal. No increase in right ventricular wall thickness. Right ventricular systolic function is normal. Left Atrium: Left atrial size was normal in size. Right Atrium: Right atrial size was normal in size. Pericardium: A small pericardial effusion is present. The pericardial effusion is anterior to the right ventricle. Mitral Valve: The mitral valve is normal in structure. No evidence of mitral valve regurgitation. No evidence of mitral valve stenosis. Tricuspid Valve: The tricuspid valve is normal in structure. Tricuspid valve regurgitation is not demonstrated. No evidence of tricuspid stenosis. Aortic Valve: The aortic valve was not well visualized. Aortic valve regurgitation is not visualized. No aortic stenosis is present. Aortic valve peak gradient measures 6.0 mmHg. Pulmonic Valve: The pulmonic valve was normal in structure. Pulmonic valve regurgitation is not visualized. No evidence of pulmonic stenosis. Aorta: The aortic root is normal in size and structure. Venous: The inferior vena cava is dilated in size with greater than 50% respiratory variability, suggesting right atrial pressure of 8 mmHg. IAS/Shunts: No atrial level shunt detected by color flow Doppler. Additional Comments: 3D was performed not requiring image post processing on an independent workstation and was indeterminate.  LEFT VENTRICLE PLAX 2D LVIDd:         4.30 cm   Diastology LVIDs:         2.80 cm   LV e' medial:    6.42 cm/s LV PW:         1.40 cm   LV E/e' medial:  6.3 LV IVS:        1.20 cm   LV e' lateral:   8.81 cm/s LVOT diam:     2.00 cm   LV E/e' lateral: 4.6 LV SV:         64 LV SV Index:   29 LVOT Area:     3.14 cm  RIGHT VENTRICLE             IVC RV S prime:     13.10 cm/s  IVC diam:  2.60 cm TAPSE (M-mode): 1.9 cm LEFT ATRIUM             Index        RIGHT ATRIUM          Index LA diam:        3.20 cm 1.44 cm/m   RA Area:     9.32 cm LA Vol (A2C):   37.9 ml 17.09 ml/m  RA Volume:   17.10 ml 7.71 ml/m LA Vol (A4C):   21.9 ml 9.87 ml/m LA Biplane Vol: 29.1 ml 13.12 ml/m  AORTIC VALVE AV Area (Vmax): 3.06 cm AV Vmax:        122.00 cm/s AV Peak Grad:   6.0 mmHg LVOT Vmax:      119.00 cm/s LVOT Vmean:     70.100 cm/s LVOT VTI:       0.203 m  AORTA Ao Root diam: 3.60 cm Ao Asc diam:  3.00 cm MITRAL VALVE MV Area (PHT): 4.15 cm    SHUNTS MV Decel Time: 183 msec    Systemic VTI:  0.20 m MV E velocity: 40.20 cm/s  Systemic Diam: 2.00 cm MV A velocity: 59.10 cm/s MV E/A ratio:  0.68 Andre Emmer MD Electronically signed by Andre Emmer MD Signature Date/Time: 05/02/2024/6:10:32 PM    Final  DG CHEST PORT 1 VIEW Result Date: 05/02/2024 EXAM: 1 VIEW XRAY OF THE CHEST 05/02/2024 04:06:00 PM COMPARISON: None available. CLINICAL HISTORY: Encounter for management of intra-aortic balloon pump. FINDINGS: LINES, TUBES AND DEVICES: Swan-Ganz catheter via femoral approach terminates in right lower lobe pulmonary artery. External wires and leads noted. Intraaortic balloon pump marker is not confidently identified, and may be obscured by cardiac leads. LUNGS AND PLEURA: No focal pulmonary opacity. No pulmonary edema. No pleural effusion. No pneumothorax. HEART AND MEDIASTINUM: No acute abnormality of the cardiac and mediastinal silhouettes. ACDF hardware in lower cervical spine. BONES AND SOFT TISSUES: No acute osseous abnormality. IMPRESSION: 1. Swan-Ganz catheter via femoral approach terminates in the right lower lobe pulmonary artery. 2. Intraaortic balloon pump marker is not confidently identified, possibly obscured by cardiac leads. 3. ACDF hardware in the lower cervical spine. Electronically signed by: Waddell Calk MD 05/02/2024 04:24 PM EDT RP Workstation: [id number]   CARDIAC  CATHETERIZATION Result Date: 05/02/2024 Images from the original result were not included. Cardiac Catheterization 05/02/24: Hemodynamic data: RA 18/EDP 18 mmHg. PA 40/26, mean 31 mmHg.  PA saturation 51%.  AO saturation 97%. RV 47/16, EDP 18 mmHg. PW 29/28, mean 27 mmHg. QP/QS 1.0.  CO 3.61, CI 1.69.  By Denney.  PAPi 0.9. LV 81/17, EDP 27 mmHg.  AO x 1 100/50, mean 71 mmHg.  No pressure gradient across the aortic valve. Angiographic data: LV: Entire anterolateral wall akinetic, EF 25 to 30%. LM: Large-caliber vessel and mildly calcified. LAD: Gives origin to a moderate-sized D1 and a small to moderate-sized D2, proximal LAD is occluded, it is moderately calcified.  Mid to distal LAD has a 30% stenosis. RI: Moderate caliber vessel with a high-grade tandem 80 to 95% stenosis in the midsegment. Cx: Large-caliber vessel and a dominant vessel.  Gives origin to large OM1 which is tandem 80 to 90% stenosis after the origin of OM1, mid Cx has a eccentric 70% stenosis.  Gives origin to moderate-sized PL branch distally and a large RPDA which are disease-free. RCA: Nondominant.  Relatively large vessel.  Mid segment has a focal 90% stenosis followed by diffuse 80% long segment stenosis. Intervention data: Successful PTCA and stenting of the proximal LAD with implantation of a 3.5 x 38 mm Synergy XD DES, stenosis reduced from 100% to 0% with TIMI 0 to TIMI-3 flow.  No sidebranch compromise. Due to markedly elevated EDP, need for pressor support, right heart catheterization was performed and noted to have low cardiac index and surprisingly PAPi was also low bedside echo revealing normal RV function.  Hence decided to proceed with placement of IABP, patient was able to wean off of norepinephrine  at the end of the procedure with stable hemodynamics. Impression and recommendations: Patient will need aggressive risk factor modification, presently not on any beta-blockers or ACE inhibitors due to cardiogenic shock, continue IABP  support, appreciate the help of Dr. Gwynne Commander during cardiac cath. He will continue to follow and manage his hemodynamics.  Patient was started on aspirin  and was loaded with Effient .  Patient also received loading dose of Aggrastat  which was also given intracoronary as there was thrombus in the midsegment of the LAD with complete resolution.     Patient Profile   68 y.o. male with history of prostate cancer s/p prostatectomy, tobacco use, HLD, prediabetes s/p PCI to the LAD for anterior STEMI requiring IABP support.   Assessment/Plan   SCAI C Cardiogenic shock 2/2 Anterior STEMI -LHC occluded p LAD treated with PCI/DES.  Residual disease in RCA and LCX -DAPT with aspirin  + effient , atorvastatin  80 -PA 28/11, CVP 4, TD CI 2.27 L/min/m2, SVR 1200. eFick CI of 2.3 L/min/m2 - IABP removed yesterday; mixed venous 61%, sCr WNL.  - 2.9 L urine output q24h, negative 1.6L.  - D/C femoral swan - CVP 4-5, PA diastolic in 12-15 - D/C hep gtt - After ambulation, plan on transfer to floor later this evening  Anterior STEMI / CAD - S/P PCI to the LAD on by Dr. Ladona on 05/02/24.  - Continue DAPT + high intensity statin - Early thrombus formation on TTE; will plan on short term oral AC. Can D/C ASA at discharge.  - D/C hep gtt; start apixaban  this evening.   Hyperlipidemia -continue lipitor  80mg  daily.   Prostate cancer  -S/p prostatectomy -On Xtandi  Tobacco use -Recommend smoking cessation   Lovelee Forner, DO 05/04/2024, 11:08 AM  Total Time Spent 30 minutes  Advanced Heart Failure Team Pager [phone] (M-F; 7a - 5p)  Please contact [facility] for night-coverage after hours (4p -7a ) and weekends on [email or web address]   CRITICAL CARE Performed by: Ria Commander   Total critical care time: 35 minutes  Critical care time was exclusive of separately billable procedures and treating other patients.  Critical care was necessary to treat or prevent imminent or life-threatening  deterioration.  Critical care was time spent personally by me on the following activities: development of treatment plan with patient and/or surrogate as well as nursing, discussions with consultants, evaluation of patient's response to treatment, examination of patient, obtaining history from patient or surrogate, ordering and performing treatments and interventions, ordering and review of laboratory studies, ordering and review of radiographic studies, pulse oximetry and re-evaluation of patient's condition.

## 2024-05-04 NOTE — Progress Notes (Signed)
 PHARMACY - ANTICOAGULATION CONSULT NOTE  Pharmacy Consult for IV heparin  Indication: LV thrombus  No Known Allergies  Patient Measurements: Height: 5' 11 (180.3 cm) Weight: 102.2 kg (225 lb 5 oz) IBW/kg (Calculated) : 75.3 HEPARIN  DW (KG): 96.5  Vital Signs: Temp: 99.7 F (37.6 C) (09/21 1030) Temp Source: Core (09/21 0800) BP: 120/78 (09/21 1030) Pulse Rate: 83 (09/21 1030)  Labs: Recent Labs    05/02/24 1220 05/02/24 1254 05/02/24 1500 05/02/24 1709 05/03/24 0530 05/03/24 1000 05/03/24 1530 05/04/24 0112 05/04/24 0410 05/04/24 0844  HGB 12.6*   < >  --    < > 12.4*  --  12.1*  --  11.7*  --   HCT 37.3*   < >  --    < > 37.5*  --  37.0*  --  35.0*  --   PLT 328  --   --    < > 292  --  242  --  243  --   APTT >200*  --   --   --   --   --   --   --   --   --   LABPROT 17.0*  --   --   --   --   --   --   --   --   --   INR 1.3*  --   --   --   --   --   --   --   --   --   HEPARINUNFRC  --   --   --    < >  --  0.10*  --  0.14*  --  0.19*  CREATININE 0.89  --   --    < > 0.77  --  0.78  --  0.74  --   TROPONINIHS 695*  --  >24,000*  --   --   --   --   --   --   --    < > = values in this interval not displayed.    Estimated Creatinine Clearance: 107.6 mL/min (by C-G formula based on SCr of 0.74 mg/dL).   Medical History: Past Medical History:  Diagnosis Date   Cellulitis of neck 12/13/2021   Headache    Migraines   Prostate cancer (HCC)    Skin cancer screening 12/13/2021    Medications:  Infusions:   cefTRIAXone  (ROCEPHIN )  IV 200 mL/hr at 05/04/24 1000   heparin  1,500 Units/hr (05/04/24 1000)   norepinephrine  (LEVOPHED ) Adult infusion Stopped (05/02/24 2002)    Assessment: 68 yo male s/p STEMI on IABP, likely just overnight.  Pharmacy asked to add heparin  while IABP in place.  No current bleeding or complications noted.  Heparin  level 0.19 is subtherapeutic with heparin  running at 1500 units/hr. Hgb (11.7) and PLTs (243) are stable. No  titration in dose as IABP was removed this afternoon at 1412. Per RN, no report of pauses, issues with the line, or signs of bleeding. Per AHF MD, can transition to Eliquis  for LV thrombus.   Goal of Therapy:  Monitor platelets by anticoagulation protocol: Yes   Plan:  Stop heparin  infusion Start apixaban  5 mg twice daily this evening after sheath is pulled per Dr. Gardenia Daily CBC and signs/symptoms of bleeding   Thank you for allowing pharmacy to be a part of this patient's care.   Nidia Schaffer, PharmD PGY2 Cardiology Pharmacy Resident  Please check AMION for all [facility] Pharmacy phone numbers After 10:00 PM, call  Main Pharmacy [phone] 05/04/2024 11:11 AM

## 2024-05-04 NOTE — Progress Notes (Signed)
 PHARMACY - ANTICOAGULATION CONSULT NOTE  Pharmacy Consult for IV heparin  Indication: LV thrombus  No Known Allergies  Patient Measurements: Height: 5' 11 (180.3 cm) Weight: 102.2 kg (225 lb 5 oz) IBW/kg (Calculated) : 75.3 HEPARIN  DW (KG): 96.5  Vital Signs: Temp: 99.9 F (37.7 C) (09/21 0100) Temp Source: Core (09/20 1930) BP: 92/58 (09/21 0100) Pulse Rate: 83 (09/21 0100)  Labs: Recent Labs    05/02/24 1220 05/02/24 1254 05/02/24 1500 05/02/24 1709 05/03/24 0153 05/03/24 0530 05/03/24 1000 05/03/24 1530 05/04/24 0112  HGB 12.6*   < >  --  12.8*  --  12.4*  --  12.1*  --   HCT 37.3*   < >  --  38.6*  --  37.5*  --  37.0*  --   PLT 328  --   --  323  --  292  --  242  --   APTT >200*  --   --   --   --   --   --   --   --   LABPROT 17.0*  --   --   --   --   --   --   --   --   INR 1.3*  --   --   --   --   --   --   --   --   HEPARINUNFRC  --   --   --   --  0.12*  --  0.10*  --  0.14*  CREATININE 0.89  --   --  0.75  --  0.77  --  0.78  --   TROPONINIHS 695*  --  >24,000*  --   --   --   --   --   --    < > = values in this interval not displayed.    Estimated Creatinine Clearance: 107.6 mL/min (by C-G formula based on SCr of 0.78 mg/dL).   Medical History: Past Medical History:  Diagnosis Date   Cellulitis of neck 12/13/2021   Headache    Migraines   Prostate cancer (HCC)    Skin cancer screening 12/13/2021    Medications:  Infusions:   sodium chloride  10 mL/hr at 05/03/24 2100   cefTRIAXone  (ROCEPHIN )  IV Stopped (05/03/24 1105)   heparin  1,300 Units/hr (05/03/24 2317)   norepinephrine  (LEVOPHED ) Adult infusion Stopped (05/02/24 2002)    Assessment: 68 yo male s/p STEMI on IABP, likely just overnight.  Pharmacy asked to add heparin  while IABP in place.  No current bleeding or complications noted.  Heparin  level 0.14 is subtherapeutic with heparin  running at 1300 units/hr. Hgb (12.1) and PLTs (242) are stable.   Goal of Therapy:  Heparin   level 0.3-0.5 Monitor platelets by anticoagulation protocol: Yes   Plan:  Increase heparin  infusion to 1500 units/hr Check heparin  level in 6 hrs Daily heparin  level and CBC   Thank you for allowing pharmacy to be a part of this patient's care.  Bascom JAYSON Louder, PharmD 05/04/2024 2:22 AM  **Pharmacist phone directory can be found on [email or web address] listed under [facility]**

## 2024-05-04 NOTE — Plan of Care (Signed)
  Problem: Education: Goal: Knowledge of General Education information will improve Description: Including pain rating scale, medication(s)/side effects and non-pharmacologic comfort measures Outcome: Progressing   Problem: Health Behavior/Discharge Planning: Goal: Ability to manage health-related needs will improve Outcome: Progressing   Problem: Clinical Measurements: Goal: Ability to maintain clinical measurements within normal limits will improve Outcome: Progressing Goal: Will remain free from infection Outcome: Progressing Goal: Diagnostic test results will improve Outcome: Progressing Goal: Respiratory complications will improve Outcome: Progressing Goal: Cardiovascular complication will be avoided Outcome: Progressing   Problem: Activity: Goal: Risk for activity intolerance will decrease Outcome: Progressing   Problem: Nutrition: Goal: Adequate nutrition will be maintained Outcome: Progressing   Problem: Coping: Goal: Level of anxiety will decrease Outcome: Progressing   Problem: Elimination: Goal: Will not experience complications related to bowel motility Outcome: Progressing Goal: Will not experience complications related to urinary retention Outcome: Progressing   Problem: Pain Managment: Goal: General experience of comfort will improve and/or be controlled Outcome: Progressing   Problem: Safety: Goal: Ability to remain free from injury will improve Outcome: Progressing   Problem: Skin Integrity: Goal: Risk for impaired skin integrity will decrease Outcome: Progressing   Problem: Education: Goal: Understanding of CV disease, CV risk reduction, and recovery process will improve Outcome: Progressing   Problem: Activity: Goal: Ability to return to baseline activity level will improve Outcome: Progressing   Problem: Cardiovascular: Goal: Ability to achieve and maintain adequate cardiovascular perfusion will improve Outcome: Progressing Goal:  Vascular access site(s) Level 0-1 will be maintained Outcome: Progressing   Problem: Health Behavior/Discharge Planning: Goal: Ability to safely manage health-related needs after discharge will improve Outcome: Progressing

## 2024-05-05 ENCOUNTER — Other Ambulatory Visit (HOSPITAL_COMMUNITY): Payer: Self-pay

## 2024-05-05 ENCOUNTER — Telehealth (HOSPITAL_COMMUNITY): Payer: Self-pay | Admitting: Pharmacy Technician

## 2024-05-05 ENCOUNTER — Inpatient Hospital Stay (HOSPITAL_COMMUNITY)

## 2024-05-05 DIAGNOSIS — I5021 Acute systolic (congestive) heart failure: Secondary | ICD-10-CM

## 2024-05-05 LAB — CBC
HCT: 35 % — ABNORMAL LOW (ref 39.0–52.0)
Hemoglobin: 12 g/dL — ABNORMAL LOW (ref 13.0–17.0)
MCH: 32.4 pg (ref 26.0–34.0)
MCHC: 34.3 g/dL (ref 30.0–36.0)
MCV: 94.6 fL (ref 80.0–100.0)
Platelets: 235 K/uL (ref 150–400)
RBC: 3.7 MIL/uL — ABNORMAL LOW (ref 4.22–5.81)
RDW: 13 % (ref 11.5–15.5)
WBC: 12.3 K/uL — ABNORMAL HIGH (ref 4.0–10.5)
nRBC: 0 % (ref 0.0–0.2)

## 2024-05-05 LAB — ECHOCARDIOGRAM LIMITED
Calc EF: 52.5 %
Est EF: 25
Height: 71 in
S' Lateral: 3.1 cm
Single Plane A2C EF: 48.1 %
Single Plane A4C EF: 53.1 %
Weight: 3604.96 [oz_av]

## 2024-05-05 LAB — BASIC METABOLIC PANEL WITH GFR
Anion gap: 10 (ref 5–15)
BUN: 11 mg/dL (ref 8–23)
CO2: 23 mmol/L (ref 22–32)
Calcium: 8.5 mg/dL — ABNORMAL LOW (ref 8.9–10.3)
Chloride: 102 mmol/L (ref 98–111)
Creatinine, Ser: 1.04 mg/dL (ref 0.61–1.24)
GFR, Estimated: >60 mL/min (ref >60)
Glucose, Bld: 104 mg/dL — ABNORMAL HIGH (ref 70–99)
Potassium: 3.9 mmol/L (ref 3.5–5.1)
Sodium: 135 mmol/L (ref 135–145)

## 2024-05-05 LAB — LIPOPROTEIN A (LPA): Lipoprotein (a): <8.4 nmol/L (ref <75.0)

## 2024-05-05 MED ORDER — PERFLUTREN LIPID MICROSPHERE
1.0000 mL | INTRAVENOUS | Status: AC | PRN
  Administered 2024-05-05: 4 mL via INTRAVENOUS

## 2024-05-05 MED ORDER — SPIRONOLACTONE 25 MG PO TABS
12.5000 mg | ORAL_TABLET | Freq: Every day | ORAL | 5 refills | Status: AC
  Filled 2024-05-05: qty 30, 60d supply, fill #0

## 2024-05-05 MED ORDER — PANTOPRAZOLE SODIUM 40 MG PO TBEC
40.0000 mg | DELAYED_RELEASE_TABLET | Freq: Every day | ORAL | Status: DC
Start: 2024-05-05 — End: 2024-05-05
  Administered 2024-05-05: 40 mg via ORAL
  Filled 2024-05-05: qty 1

## 2024-05-05 MED ORDER — ATORVASTATIN CALCIUM 80 MG PO TABS
80.0000 mg | ORAL_TABLET | Freq: Every day | ORAL | 5 refills | Status: AC
  Filled 2024-05-05: qty 30, 30d supply, fill #0

## 2024-05-05 MED ORDER — ASPIRIN 81 MG PO CHEW
81.0000 mg | CHEWABLE_TABLET | Freq: Every day | ORAL | 5 refills | Status: DC
  Filled 2024-05-05: qty 30, 30d supply, fill #0

## 2024-05-05 MED ORDER — PANTOPRAZOLE SODIUM 40 MG PO TBEC
40.0000 mg | DELAYED_RELEASE_TABLET | Freq: Every day | ORAL | 5 refills | Status: AC
  Filled 2024-05-05: qty 30, 30d supply, fill #0

## 2024-05-05 MED ORDER — SPIRONOLACTONE 12.5 MG HALF TABLET: 12.5000 mg | ORAL_TABLET | Freq: Every day | ORAL | Status: DC

## 2024-05-05 MED ORDER — PRASUGREL HCL 10 MG PO TABS
10.0000 mg | ORAL_TABLET | Freq: Every day | ORAL | 5 refills | Status: AC
  Filled 2024-05-05: qty 30, 30d supply, fill #0

## 2024-05-05 MED ORDER — HYDROCOD POLI-CHLORPHE POLI ER 10-8 MG/5ML PO SUER: 5.0000 mL | Freq: Two times a day (BID) | ORAL | Status: DC | PRN

## 2024-05-05 MED FILL — Tirofiban HCl in NaCl 0.9% IV Soln 5 MG/100ML (Base Equiv): INTRAVENOUS | Qty: 100 | Status: AC

## 2024-05-05 NOTE — Plan of Care (Signed)
   Problem: Education: Goal: Knowledge of General Education information will improve Description Including pain rating scale, medication(s)/side effects and non-pharmacologic comfort measures Outcome: Progressing   Problem: Health Behavior/Discharge Planning: Goal: Ability to manage health-related needs will improve Outcome: Progressing

## 2024-05-05 NOTE — TOC CM/SW Note (Addendum)
 Transition of Care [facility]) - Inpatient Brief Assessment   Patient Details  Name: Andre Cowan MRN: [id number] Date of Birth: Mar 22, 1956  [facility]) CM/SW Contact:    Waddell Barnie Rama, RN Phone Number: 05/05/2024, 2:30 PM   Clinical Narrative: From home with daughter, has PCP and insurance on file, states has no HH services in place at this time or DME at home.  States family member  (daughter) will transport them home at [employer] and family is support system, states gets medications from [facility] on [address]  Pta self ambulatory.   There are no ICM (Inpatient Case Management) needs identified  at this time.  Please place consult for ICM (Inpatient Case Management)  needs.  He gives this NCM permission to speak with daughter ,Mitzie, she is his first order of contact.    Transition of Care Asessment: Insurance and Status: Insurance coverage has been reviewed Patient has primary care physician: Yes Home environment has been reviewed: home with daugther Prior level of function:: indep Prior/Current Home Services: No current home services Social Drivers of Health Review: SDOH reviewed no interventions necessary Readmission risk has been reviewed: Yes Transition of care needs: no transition of care needs at this time

## 2024-05-05 NOTE — Discharge Summary (Cosign Needed)
 Advanced Heart Failure Team  Discharge Summary   Patient ID: Andre Cowan MRN: [id number], DOB/AGE: [birth year] 69 y.o. Admit date: 05/02/2024 D/C date:     05/05/2024   Primary Discharge Diagnoses:  SCAI stage C Cardiogenic shock 2/2 anterior STEMI Anterior STEMI  CAD Acute HFrEF HLD  Secondary Discharge Diagnoses:  Prostate cancer Tobacco use  Hospital Course:  Andre Cowan is a 68 y.o. male with history of prostate cancer s/p prostatectomy, tobacco use, HLD and prediabetes.   He was admitted with anterior STEMI. During PCI he briefly required NE. RHC findings significant for and elevated biventricular filling pressures and reduced cardiac index. Bedside echo showed hypokinesis to akinesis of the LV apex and LAD territory with some concern for possible LAD thrombus. Decision made to proceed with IABP support. IABP and pressor support weaned off. Stable at discharge, ambulating, family at bedside. GDMT limited with soft BP. Plan to add further GDMT OP.   Echo today official read pending. Discussed with AHF cardiologists and all agreed that there was no clot and EF was improving. Will stop Eliquis  (as it interacts with Xtandi) and keep on ASA and effient . Stable for discharge. Will send all meds to [facility] pharmacy.   Pt will continue to be followed closely in the HF clinic. Dr Gardenia evaluated and deemed appropriate for discharge.   Discharge Weight Range: 102.2 kg Discharge Vitals: Blood pressure 90/61, pulse 95, temperature 100.2 F (37.9 C), temperature source Oral, resp. rate (!) 21, height 5' 11 (1.803 m), weight 102.2 kg, SpO2 95%.  Labs: Lab Results  Component Value Date   WBC 12.3 (H) 05/05/2024   HGB 12.0 (L) 05/05/2024   HCT 35.0 (L) 05/05/2024   MCV 94.6 05/05/2024   PLT 235 05/05/2024    Recent Labs  Lab 05/03/24 1530 05/04/24 0410 05/05/24 0246  NA 134*   < > 135  K 3.6   < > 3.9  CL 104   < > 102  CO2 20*   < > 23  BUN 10   < > 11  CREATININE 0.78   <  > 1.04  CALCIUM  8.3*   < > 8.5*  PROT 5.9*  --   --   BILITOT 0.7  --   --   ALKPHOS 40  --   --   ALT 42  --   --   AST 173*  --   --   GLUCOSE 119*   < > 104*   < > = values in this interval not displayed.   Lab Results  Component Value Date   CHOL 186 05/02/2024   HDL 38 (L) 05/02/2024   LDLCALC 119 (H) 05/02/2024   TRIG 146 05/02/2024   BNP (last 3 results) No results for input(s): BNP in the last 8760 hours.  ProBNP (last 3 results) No results for input(s): PROBNP in the last 8760 hours.   Diagnostic Studies/Procedures   No results found.  Discharge Medications   Allergies as of 05/05/2024   No Known Allergies      Medication List     TAKE these medications    aspirin  81 MG chewable tablet Chew 1 tablet (81 mg total) by mouth daily. Start taking on: May 06, 2024   atorvastatin  80 MG tablet Commonly known as: LIPITOR  Take 1 tablet (80 mg total) by mouth daily. Start taking on: May 06, 2024   pantoprazole  40 MG tablet Commonly known as: PROTONIX  Take 1 tablet (40 mg total) by mouth daily. Start  taking on: May 06, 2024   prasugrel  10 MG Tabs tablet Commonly known as: EFFIENT  Take 1 tablet (10 mg total) by mouth daily. Start taking on: May 06, 2024   spironolactone  25 MG tablet Commonly known as: ALDACTONE  Take 0.5 tablets (12.5 mg total) by mouth at bedtime.   Xtandi 40 MG capsule Generic drug: enzalutamide Take 80 mg by mouth every evening.        Disposition   The patient will be discharged in stable condition to home. Discharge Instructions     Amb Referral to Cardiac Rehabilitation   Complete by: As directed    Diagnosis:  Coronary Stents STEMI PTCA     After initial evaluation and assessments completed: Virtual Based Care may be provided alone or in conjunction with Phase 2 Cardiac Rehab based on patient barriers.: Yes   Intensive Cardiac Rehabilitation (ICR) [facility] location only OR Traditional Cardiac  Rehabilitation (TCR) *If criteria for ICR are not met will enroll in TCR [facility] only): Yes       Follow-up Information     de [place], Quintin PARAS, MD Follow up.   Specialty: Family Medicine Why: Doctors office will call patient to set up hospital follow up apt Contact information: [address] [place] [address] [phone]                   Duration of Discharge Encounter: 15 minutes  Signed, Beckey LITTIE Coe AGACNP-BC  05/05/2024, 4:39 PM  Patient seen with APP, plan extensively discussed.   Subjective: Feels well   Exam: Blood pressure 90/61, pulse 95, temperature 100.2 F (37.9 C), temperature source Oral, resp. rate (!) 21, height 5' 11 (1.803 m), weight 102.2 kg, SpO2 95%.  GENERAL: NAD Lungs- cta CARDIAC:  JVP: 6 cm          Normal rate with regular rhythm. no murmur.  no edema.  ABDOMEN: Soft, non-tender, non-distended.  EXTREMITIES: Warm and well perfused.  NEUROLOGIC: No obvious FND  A/P - euvolemic on exam.  - stable for discharge.  - lengthy discussion regarding med compliance, etc.    Brystal Kildow Advanced Heart Failure  Time spent on discharge 35 minutes

## 2024-05-05 NOTE — Telephone Encounter (Signed)
 Patient Product/process development scientist completed.    The patient is insured through [facility]. Patient has [employer], may use a copay card, and/or apply for patient assistance if available.    Ran test claim for Eliquis  5 mg and the current 30 day co-pay is $150.00.  Ran test claim for Farxgia 10mg  and the current 30 day co-pay is $150.00.  Ran test claim for Jardiance 10 mg and the current 30 day co-pay is $150.00  Ran test claim for prasugrel  10 mg and the current 30 day co-pay is $13.84.  Ran test claim for sacubitril-valsartan Lella) 24-26 mg and the current 30 day co-pay is $15.00  This test claim was processed through [employer]- copay amounts may vary at other pharmacies due to [employer], or as the patient moves through the different stages of their insurance plan.     Reyes Sharps, CPHT Pharmacy Technician III Certified Patient Advocate [facility] Patient Advocate Team Direct Number: [phone]  Fax: [phone]

## 2024-05-05 NOTE — Progress Notes (Addendum)
 Pt just ambulated without difficulty, feels well. Family in room. Discussed MI, stent, Effient  importance, restrictions, other CAD (pt was unaware), smoking cessation, diet, exercise, daily wts to watch for fluid, and CRPII. Gave pt MI and HF booklets plus other handouts. Pt receptive. Sts he is done smoking. Will refer to [facility] CRPII.  [id number] Aliene Aris BS, ACSM-CEP 05/05/2024 12:37 PM

## 2024-05-05 NOTE — TOC Transition Note (Signed)
 Transition of Care [facility]) - Discharge Note   Patient Details  Name: Andre Cowan MRN: [id number] Date of Birth: [birth year]  Transition of [facility]) CM/SW Contact:  Waddell Barnie Rama, RN Phone Number: 05/05/2024, 2:34 PM   Clinical Narrative:    For possible dc today, the doctor's office will call patient to set up the hospital follow up apt.         Patient Goals and CMS Choice            Discharge Placement                       Discharge Plan and Services Additional resources added to the After Visit Summary for                                       Social Drivers of Health (SDOH) Interventions SDOH Screenings   Food Insecurity: No Food Insecurity (05/02/2024)  Housing: Low Risk  (05/02/2024)  Transportation Needs: No Transportation Needs (05/02/2024)  Utilities: Not At Risk (05/02/2024)  Alcohol Screen: Low Risk  (11/15/2023)  Depression (PHQ2-9): Low Risk  (11/15/2023)  Financial Resource Strain: Low Risk  (11/15/2023)  Physical Activity: Insufficiently Active (11/15/2023)  Social Connections: Moderately Isolated (05/02/2024)  Stress: No Stress Concern Present (11/15/2023)  Tobacco Use: High Risk (05/02/2024)  Health Literacy: Adequate Health Literacy (11/15/2023)     Readmission Risk Interventions    05/05/2024    2:29 PM  Readmission Risk Prevention Plan  Post Dischage Appt Complete  Medication Screening Complete  Transportation Screening Complete

## 2024-05-05 NOTE — Progress Notes (Addendum)
 Advanced Heart Failure Progress Note   PCP:  de Peru, Raymond J, MD  PCP-Cardiology: None     CC: Anterior STEMI / Cardiogenic Shock   Interval hx  - IABP removed 9/20  - Feels good this morning. Denies CP/SOB. Has been walking around his room. Asked him to continue to ambulate in the halls. Complaining of cough.   Objective:    Vital Signs:   Temp:  [97.2 F (36.2 C)-99.9 F (37.7 C)] 98.6 F (37 C) (09/22 0500) Pulse Rate:  [83-112] 96 (09/22 0500) Resp:  [17-26] 17 (09/22 0500) BP: (86-120)/(53-86) 91/61 (09/22 0500) SpO2:  [91 %-98 %] 94 % (09/22 0500) Last BM Date : 05/02/24 Filed Weights   05/02/24 1100 05/02/24 1500  Weight: 93.9 kg 102.2 kg   Physical Exam   General:  well appearing.  No respiratory difficulty Neck: JVD ~7 cm.  Cor: Tachy/regular rate & rhythm. No murmurs. Lungs: L clear. R slightly diminished Extremities: no edema. R groin site stable just with ecchymosis.  Neuro: alert & oriented x 3. Affect pleasant.   EKG   ST low 100s (Personally reviewed)    Labs   Basic Metabolic Panel: Recent Labs  Lab 05/02/24 1459 05/02/24 1709 05/03/24 0530 05/03/24 0730 05/03/24 1530 05/04/24 0410 05/05/24 0246  NA  --  137 135  --  134* 134* 135  K  --  4.2 4.1  --  3.6 3.9 3.9  CL  --  106 105  --  104 104 102  CO2  --  19* 19*  --  20* 22 23  GLUCOSE  --  159* 125*  --  119* 111* 104*  BUN  --  15 14  --  10 9 11   CREATININE  --  0.75 0.77  --  0.78 0.74 1.04  CALCIUM   --  8.6* 8.4*  --  8.3* 8.3* 8.5*  MG 2.0 1.9  --  1.9 2.6*  --   --     Liver Function Tests: Recent Labs  Lab 05/02/24 1220 05/02/24 1709 05/03/24 0530 05/03/24 1530  AST 22 452* 282* 173*  ALT 14 54* 49* 42  ALKPHOS 46 48 44 40  BILITOT 0.6 0.5 0.8 0.7  PROT 5.9* 6.1* 5.9* 5.9*  ALBUMIN 3.1* 3.3* 3.1* 3.0*   No results for input(s): LIPASE, AMYLASE in the last 168 hours. No results for input(s): AMMONIA in the last 168 hours.  CBC: Recent Labs  Lab  05/02/24 1220 05/02/24 1254 05/02/24 1709 05/03/24 0530 05/03/24 1530 05/04/24 0410 05/05/24 0246  WBC 11.7*  --  12.9* 14.4* 14.2* 12.6* 12.3*  NEUTROABS 8.4*  --   --   --   --   --   --   HGB 12.6*   < > 12.8* 12.4* 12.1* 11.7* 12.0*  HCT 37.3*   < > 38.6* 37.5* 37.0* 35.0* 35.0*  MCV 93.3  --  96.0 94.9 96.4 95.4 94.6  PLT 328  --  323 292 242 243 235   < > = values in this interval not displayed.    CBG: Recent Labs  Lab 05/02/24 1509  GLUCAP 127*    Coagulation Studies: Recent Labs    05/02/24 1220  LABPROT 17.0*  INR 1.3*    Imaging: No results found.   Patient Profile   68 y.o. male with history of prostate cancer s/p prostatectomy, tobacco use, HLD, prediabetes s/p PCI to the LAD for anterior STEMI requiring IABP support.   Assessment/Plan  SCAI C Cardiogenic shock 2/2 Anterior STEMI -LHC occluded p LAD treated with PCI/DES. Residual disease in RCA and LCX -DAPT with aspirin  + effient , atorvastatin  80 - PA 28/11, CVP 4, TD CI 2.27 L/min/m2, SVR 1200. eFick CI of 2.3 L/min/m2 - IABP removed 9/20, sCr WNL.  - net - 1.2L. No diuretics given.   Anterior STEMI / CAD - S/P PCI to the LAD on by Dr. Ladona on 05/02/24.  - Continue DAPT + high intensity statin - Early thrombus formation on TTE; will plan on short term oral AC. Can D/C ASA at discharge.  - Continue apixaban .   Acute HFrEF - iCM - Echo 05/02/24: EF 25%, LV with RWMA, RV systolic function normal. Small pericardial effusion present.  - GDMT very limited with soft BP.  - Will start spiro 12.5 mg QHS - Appears euvolemic, does not need diuretic at this time.  - Consider repeating limited echo  Hyperlipidemia -continue lipitor  80mg  daily.   Prostate cancer  -S/p prostatectomy -On Xtandi  Tobacco use -Recommend smoking cessation  Will arrange close follow up in AHF clinic. Plan for discharge later today. Will send meds to [facility] pharmacy.    Beckey LITTIE Coe, NP 05/05/2024, 8:39  AM   Advanced Heart Failure Team Pager [phone] (M-F; 7a - 5p)  Please contact [facility] for night-coverage after hours (4p -7a ) and weekends on [email or web address]

## 2024-05-05 NOTE — Discharge Instructions (Signed)
Information on my medicine - ELIQUIS (apixaban)  Why was Eliquis prescribed for you? Eliquis was prescribed for you to reduce the risk of a stroke from a clot found in the heart.    What do You need to know about Eliquis ? Take your Eliquis TWICE DAILY - one tablet in the morning and one tablet in the evening with or without food. If you have difficulty swallowing the tablet whole please discuss with your pharmacist how to take the medication safely.  Take Eliquis exactly as prescribed by your doctor and DO NOT stop taking Eliquis without talking to the doctor who prescribed the medication.  Stopping may increase your risk of developing a stroke.  Refill your prescription before you run out.  After discharge, you should have regular check-up appointments with your healthcare provider that is prescribing your Eliquis.  In the future your dose may need to be changed if your kidney function or weight changes by a significant amount or as you get older.  What do you do if you miss a dose? If you miss a dose, take it as soon as you remember on the same day and resume taking twice daily.  Do not take more than one dose of ELIQUIS at the same time to make up a missed dose.  Important Safety Information A possible side effect of Eliquis is bleeding. You should call your healthcare provider right away if you experience any of the following: Bleeding from an injury or your nose that does not stop. Unusual colored urine (red or dark brown) or unusual colored stools (red or black). Unusual bruising for unknown reasons. A serious fall or if you hit your head (even if there is no bleeding).  Some medicines may interact with Eliquis and might increase your risk of bleeding or clotting while on Eliquis. To help avoid this, consult your healthcare provider or pharmacist prior to using any new prescription or non-prescription medications, including herbals, vitamins, non-steroidal anti-inflammatory  drugs (NSAIDs) and supplements.  This website has more information on Eliquis (apixaban): http://www.eliquis.com/eliquis/home  

## 2024-05-05 NOTE — Plan of Care (Signed)

## 2024-05-07 ENCOUNTER — Encounter (HOSPITAL_COMMUNITY): Payer: Self-pay

## 2024-05-07 ENCOUNTER — Ambulatory Visit (HOSPITAL_COMMUNITY): Payer: Self-pay | Admitting: Internal Medicine

## 2024-05-08 LAB — CULTURE, BLOOD (ROUTINE X 2)
Culture: NO GROWTH
Culture: NO GROWTH
Special Requests: ADEQUATE

## 2024-05-11 NOTE — Progress Notes (Signed)
 ADVANCED HF CLINIC CONSULT NOTE  Referring Physician: de [place], Quintin PARAS, MD Primary Care: de [place], Quintin PARAS, MD HF cardiologist: Dr. Gardenia  Chief Complaint: Chronic HFrEF HPI: Andre Cowan is a 68 y.o. male with history of prostate cancer s/p prostatectomy, tobacco use, HLD and prediabetes.    He was admitted 9/25 with anterior STEMI. RHC findings significant for and elevated biventricular filling pressures and reduced cardiac index. Bedside echo showed hypokinesis to akinesis of the LV apex and LAD territory with some concern for possible LAD thrombus. Decision made to proceed with IABP support. IABP and pressor support weaned off. GDMT limited with soft BP. Plan to add further GDMT OP.  Discharge echo with no definite clot seen, decision made to stop Eliquis  (as it interacted with Xtandi) and keep on ASA and Effient .  Today he/she returns for AHF/post hospital*** follow up. Overall feeling ***. Denies palpitations, CP, dizziness, edema, or PND/Orthopnea. *** SOB. Appetite ok. No fever or chills. Weight at home *** pounds. Taking all medications. Denies ETOH, tobacco or drug use.     Past Medical History:  Diagnosis Date   Cellulitis of neck 12/13/2021   Headache    Migraines   Prostate cancer (HCC)    Skin cancer screening 12/13/2021    Current Outpatient Medications  Medication Sig Dispense Refill   aspirin  81 MG chewable tablet Chew 1 tablet (81 mg total) by mouth daily. 30 tablet 5   atorvastatin  (LIPITOR ) 80 MG tablet Take 1 tablet (80 mg total) by mouth daily. 30 tablet 5   pantoprazole  (PROTONIX ) 40 MG tablet Take 1 tablet (40 mg total) by mouth daily. 30 tablet 5   prasugrel  (EFFIENT ) 10 MG TABS tablet Take 1 tablet (10 mg total) by mouth daily. 30 tablet 5   spironolactone  (ALDACTONE ) 25 MG tablet Take 0.5 tablets (12.5 mg total) by mouth at bedtime. 30 tablet 5   XTANDI 40 MG capsule Take 80 mg by mouth every evening.     No current facility-administered  medications for this visit.    No Known Allergies    Social History   Socioeconomic History   Marital status: Married    Spouse name: Not on file   Number of children: 3   Years of education: Not on file   Highest education level: Not on file  Occupational History    Comment: working full time   Tobacco Use   Smoking status: Some Days    Current packs/day: 0.00    Average packs/day: 0.5 packs/day for 40.0 years (20.0 ttl pk-yrs)    Types: Cigarettes    Start date: 09/15/1975    Last attempt to quit: 09/15/2015    Years since quitting: 8.6    Passive exposure: Current   Smokeless tobacco: Never  Vaping Use   Vaping status: Never Used  Substance and Sexual Activity   Alcohol use: Never   Drug use: Never   Sexual activity: Not Currently  Other Topics Concern   Not on file  Social History Narrative   Not on file   Social Drivers of Health   Financial Resource Strain: Low Risk  (11/15/2023)   Overall Financial Resource Strain (CARDIA)    Difficulty of Paying Living Expenses: Not hard at all  Food Insecurity: No Food Insecurity (05/02/2024)   Hunger Vital Sign    Worried About Running Out of Food in the Last Year: Never true    Ran Out of Food in the Last Year: Never true  Transportation  Needs: No Transportation Needs (05/02/2024)   PRAPARE - Administrator, Civil Service (Medical): No    Lack of Transportation (Non-Medical): No  Physical Activity: Insufficiently Active (11/15/2023)   Exercise Vital Sign    Days of Exercise per Week: 3 days    Minutes of Exercise per Session: 20 min  Stress: No Stress Concern Present (11/15/2023)   [employer] of Occupational Health - Occupational Stress Questionnaire    Feeling of Stress : Not at all  Social Connections: Moderately Isolated (05/02/2024)   Social Connection and Isolation Panel    Frequency of Communication with Friends and Family: More than three times a week    Frequency of Social Gatherings with Friends and  Family: More than three times a week    Attends Religious Services: Never    Database administrator or Organizations: No    Attends Banker Meetings: Never    Marital Status: Married  Catering manager Violence: Not At Risk (05/02/2024)   Humiliation, Afraid, Rape, and Kick questionnaire    Fear of Current or Ex-Partner: No    Emotionally Abused: No    Physically Abused: No    Sexually Abused: No      Family History  Problem Relation Age of Onset   Hypertension Father    Heart disease Father    Heart attack Father    Prostate cancer Neg Hx    Breast cancer Neg Hx    Colon cancer Neg Hx    Pancreatic cancer Neg Hx     There were no vitals filed for this visit.  PHYSICAL EXAM: General:  *** appearing.  No respiratory difficulty Neck: JVD *** cm.  Cor: Regular rate & rhythm. No murmurs. Lungs: clear Extremities: no edema  Neuro: alert & oriented x 3. Affect pleasant.   ECG:   ASSESSMENT & PLAN:   Chronic HFrEF - iCM - Echo 05/02/24: EF 25%, LV with RWMA, RV systolic function normal. Small pericardial effusion present.  - repeat echo 05/05/2024: EF 25%.  No thrombus seen but is at risk with sludging of flow at the apex. - GDMT very limited with soft BP.  - Continue spiro 12.5 mg at bedtime - Start *** - Appears euvolemic, does not need diuretic at this time.  - Repeat echo in 2 months  Recent anterior STEMI / CAD - LHC occluded p LAD treated with PCI/DES. Residual disease in RCA and LCX - S/P PCI to the LAD on by Dr. Ladona on 05/02/24.  - Continue DAPT with ASA and effient  + high intensity statin - Early thrombus formation on TTE. Not visualized on echo at discharge.    Hyperlipidemia -continue lipitor  80mg  daily.    Prostate cancer  -S/p prostatectomy -On Xtandi   Tobacco use -Recommend smoking cessation  Follow-up ***   [facility]

## 2024-05-12 ENCOUNTER — Other Ambulatory Visit (HOSPITAL_COMMUNITY): Payer: Self-pay

## 2024-05-12 ENCOUNTER — Encounter (HOSPITAL_COMMUNITY): Payer: Self-pay

## 2024-05-12 ENCOUNTER — Ambulatory Visit (HOSPITAL_COMMUNITY): Payer: Self-pay | Admitting: Internal Medicine

## 2024-05-12 ENCOUNTER — Ambulatory Visit (HOSPITAL_COMMUNITY)
Admit: 2024-05-12 | Discharge: 2024-05-12 | Disposition: A | Discharge status: Home / Self Care | Source: Ambulatory Visit | Attending: Internal Medicine | Admitting: Internal Medicine

## 2024-05-12 ENCOUNTER — Telehealth (HOSPITAL_COMMUNITY): Payer: Self-pay

## 2024-05-12 VITALS — BP 105/69 | HR 96 | Ht 71.0 in | Wt 206.8 lb

## 2024-05-12 DIAGNOSIS — E785 Hyperlipidemia, unspecified: Secondary | ICD-10-CM | POA: Insufficient documentation

## 2024-05-12 DIAGNOSIS — I252 Old myocardial infarction: Secondary | ICD-10-CM | POA: Insufficient documentation

## 2024-05-12 DIAGNOSIS — R053 Chronic cough: Secondary | ICD-10-CM | POA: Diagnosis not present

## 2024-05-12 DIAGNOSIS — Z7982 Long term (current) use of aspirin: Secondary | ICD-10-CM | POA: Diagnosis not present

## 2024-05-12 DIAGNOSIS — Z79899 Other long term (current) drug therapy: Secondary | ICD-10-CM | POA: Diagnosis not present

## 2024-05-12 DIAGNOSIS — Z8546 Personal history of malignant neoplasm of prostate: Secondary | ICD-10-CM | POA: Diagnosis not present

## 2024-05-12 DIAGNOSIS — I3139 Other pericardial effusion (noninflammatory): Secondary | ICD-10-CM | POA: Insufficient documentation

## 2024-05-12 DIAGNOSIS — C61 Malignant neoplasm of prostate: Secondary | ICD-10-CM | POA: Diagnosis not present

## 2024-05-12 DIAGNOSIS — R0601 Orthopnea: Secondary | ICD-10-CM | POA: Diagnosis not present

## 2024-05-12 DIAGNOSIS — I5022 Chronic systolic (congestive) heart failure: Secondary | ICD-10-CM | POA: Diagnosis not present

## 2024-05-12 DIAGNOSIS — Z7984 Long term (current) use of oral hypoglycemic drugs: Secondary | ICD-10-CM | POA: Diagnosis not present

## 2024-05-12 DIAGNOSIS — Z720 Tobacco use: Secondary | ICD-10-CM

## 2024-05-12 DIAGNOSIS — Z955 Presence of coronary angioplasty implant and graft: Secondary | ICD-10-CM | POA: Insufficient documentation

## 2024-05-12 DIAGNOSIS — Z9079 Acquired absence of other genital organ(s): Secondary | ICD-10-CM | POA: Diagnosis not present

## 2024-05-12 DIAGNOSIS — I2510 Atherosclerotic heart disease of native coronary artery without angina pectoris: Secondary | ICD-10-CM | POA: Diagnosis not present

## 2024-05-12 DIAGNOSIS — F17210 Nicotine dependence, cigarettes, uncomplicated: Secondary | ICD-10-CM | POA: Insufficient documentation

## 2024-05-12 DIAGNOSIS — Z7902 Long term (current) use of antithrombotics/antiplatelets: Secondary | ICD-10-CM | POA: Insufficient documentation

## 2024-05-12 LAB — BASIC METABOLIC PANEL WITH GFR
Anion gap: 13 (ref 5–15)
BUN: 15 mg/dL (ref 8–23)
CO2: 23 mmol/L (ref 22–32)
Calcium: 9 mg/dL (ref 8.9–10.3)
Chloride: 97 mmol/L — ABNORMAL LOW (ref 98–111)
Creatinine, Ser: 0.94 mg/dL (ref 0.61–1.24)
GFR, Estimated: >60 mL/min (ref >60)
Glucose, Bld: 112 mg/dL — ABNORMAL HIGH (ref 70–99)
Potassium: 4.2 mmol/L (ref 3.5–5.1)
Sodium: 133 mmol/L — ABNORMAL LOW (ref 135–145)

## 2024-05-12 LAB — BRAIN NATRIURETIC PEPTIDE: B Natriuretic Peptide: 597.5 pg/mL — ABNORMAL HIGH (ref 0.0–100.0)

## 2024-05-12 MED ORDER — EMPAGLIFLOZIN 10 MG PO TABS: 10.0000 mg | ORAL_TABLET | Freq: Every day | ORAL | 5 refills | Status: AC

## 2024-05-12 NOTE — Patient Instructions (Addendum)
 Medication Changes:  START JARDIANCE 10MG  ONCE DAILY----30 DAY FREE CARD GIVEN, PLEASE ALSO CALL REGARDING COPAY CARD THE NUMBER IS [phone]  Lab Work:  Labs done today, your results will be available in MyChart, we will contact you for abnormal readings.  Follow-Up in: as scheduled with pharmacy in 3 weeks, then again in 4-6 weeks with APP and then again in 8-10 weeks with Dr. Gardenia with ECHO.   At the Advanced Heart Failure Clinic, you and your health needs are our priority. We have a designated team specialized in the treatment of Heart Failure. This Care Team includes your primary Heart Failure Specialized Cardiologist (physician), Advanced Practice Providers (APPs- Physician Assistants and Nurse Practitioners), and Pharmacist who all work together to provide you with the care you need, when you need it.   You may see any of the following providers on your designated Care Team at your next follow up:  Dr. Toribio Fuel Dr. Ezra Shuck Dr. Ria Gardenia Dr. Odis Brownie Greig Mosses, NP Caffie Shed, [place] [facility] [place], [place] Beckey Coe, NP [place] Lee, NP Tinnie Redman, PharmD   Please be sure to bring in all your medications bottles to every appointment.   Need to Contact Us :  If you have any questions or concerns before your next appointment please send us  a message through [place] or call our office at [phone].    TO LEAVE A MESSAGE FOR THE NURSE SELECT OPTION 2, PLEASE LEAVE A MESSAGE INCLUDING: YOUR NAME DATE OF BIRTH CALL BACK NUMBER REASON FOR CALL**this is important as we prioritize the call backs  YOU WILL RECEIVE A CALL BACK THE SAME DAY AS LONG AS YOU CALL BEFORE 4:00 PM

## 2024-05-12 NOTE — Telephone Encounter (Signed)
 Advanced Heart Failure Patient Advocate Encounter  Test billing for this patient's current coverage ([employer]) returns a $150 copay for 30 day supply, $300 copay for 90 days of Farxiga or Jardiance. This patient is eligible to use copay savings cards to reduce cost of medication.  This test claim was processed through [facility]- copay amounts may vary at other pharmacies due to pharmacy/plan contracts, or as the patient moves through the different stages of their insurance plan.  Rachel DEL, CPhT Rx Patient Advocate Phone: [phone]

## 2024-05-12 NOTE — Progress Notes (Signed)
 ReDS Vest / Clip - 05/12/24 1330       ReDS Vest / Clip   Station Marker C    Ruler Value 33    ReDS Value Range Low volume    ReDS Actual Value 24

## 2024-05-15 ENCOUNTER — Telehealth (HOSPITAL_COMMUNITY): Payer: Self-pay

## 2024-05-15 NOTE — Telephone Encounter (Signed)
 Called patient to see if he was interested in participating in the Cardiac Rehab Program. Patient will come in for orientation on 10/06 and will attend the 10:15 exercise class.  Sent MyChart message.

## 2024-05-19 ENCOUNTER — Encounter (HOSPITAL_COMMUNITY)

## 2024-05-20 ENCOUNTER — Encounter (HOSPITAL_COMMUNITY)
Admission: RE | Admit: 2024-05-20 | Discharge: 2024-05-20 | Disposition: A | Discharge status: Home / Self Care | Source: Ambulatory Visit | Attending: Cardiology | Admitting: Cardiology

## 2024-05-20 VITALS — BP 100/64 | HR 92 | Ht 70.75 in | Wt 202.2 lb

## 2024-05-20 DIAGNOSIS — I213 ST elevation (STEMI) myocardial infarction of unspecified site: Secondary | ICD-10-CM | POA: Diagnosis not present

## 2024-05-20 DIAGNOSIS — Z955 Presence of coronary angioplasty implant and graft: Secondary | ICD-10-CM | POA: Insufficient documentation

## 2024-05-20 NOTE — Progress Notes (Signed)
 Patient here for cardiac rehab orientation. Patient reported having 8/10 left hip pain 4 minutes into his 6 minute walk test. Andre Cowan says this left hip pain has been going on ever since he had his MI/Stenting on 05/02/24. Patient's primary care provider Dr everitt Birk office called. Appointment obtained to Andre Cowan to see Dr De Peru tomorrow at 3:50 pm. Andre Cowan describes the pain as a muscle tightening. The pain decreased with rest. Hadassah Elpidio Quan RN BSN

## 2024-05-20 NOTE — Progress Notes (Signed)
 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/24 DES LAD  Patient's Home Medications on Admission:  Current Outpatient Medications:    aspirin  81 MG chewable tablet, Chew 1 tablet (81 mg total) by mouth daily., Disp: 30 tablet, Rfl: 5   atorvastatin  (LIPITOR ) 80 MG tablet, Take 1 tablet (80 mg total) by mouth daily., Disp: 30 tablet, Rfl: 5   empagliflozin (JARDIANCE) 10 MG TABS tablet, Take 1 tablet (10 mg total) by mouth daily before breakfast., Disp: 30 tablet, Rfl: 5   pantoprazole  (PROTONIX ) 40 MG tablet, Take 1 tablet (40 mg total) by mouth daily., Disp: 30 tablet, Rfl: 5   prasugrel  (EFFIENT ) 10 MG TABS tablet, Take 1 tablet (10 mg total) by mouth daily., Disp: 30 tablet, Rfl: 5   spironolactone  (ALDACTONE ) 25 MG tablet, Take 0.5 tablets (12.5 mg total) by mouth at bedtime., Disp: 30 tablet, Rfl: 5   XTANDI 40 MG capsule, Take 80 mg by mouth every evening., Disp: , Rfl:   Past Medical History: Past Medical History:  Diagnosis Date   Cellulitis of neck 12/13/2021   Headache    Migraines   Prostate cancer (HCC)    Skin cancer screening 12/13/2021    Tobacco Use: Social History   Tobacco Use  Smoking Status Some Days   Current packs/day: 0.00   Average packs/day: 0.5 packs/day for 40.0 years (20.0 ttl pk-yrs)   Types: Cigarettes   Start date: 09/15/1975   Last attempt to quit: 09/15/2015   Years since quitting: 8.6   Passive exposure: Current  Smokeless Tobacco Never    Labs: Review Flowsheet   More data exists      Latest Ref Rng & Units 05/16/2023 11/08/2023 05/02/2024 05/03/2024 05/04/2024  Labs for ITP Cardiac and Pulmonary Rehab  Cholestrol 0 - 200 mg/dL 761  783  813  - -  LDL (calc) 0 - 99 mg/dL 840  859  880  - -  HDL-C >40 mg/dL 45  44  38  - -  Trlycerides <150 mg/dL 812  821  853  - -  Hemoglobin A1c 4.8 - 5.6 % 6.0  6.0  5.7  - -  PH, Arterial 7.35 - 7.45 - - 7.427  - -  PCO2 arterial 32 - 48 mmHg - - 28.8  - -  Bicarbonate 20.0 - 28.0 mmol/L 20.0 - 28.0 mmol/L - - 21.2  19.0  - -  TCO2 22 - 32 mmol/L 22 - 32 mmol/L - - 22  20  - -  Acid-base deficit 0.0 - 2.0 mmol/L 0.0 - 2.0 mmol/L - - 4.0  4.0  - -  O2 Saturation % - - 57.8  60.2  51  97  58.6  62.1  65.3  78.8  61     Details       Multiple values from one day are sorted in reverse-chronological order         Capillary Blood Glucose: Lab Results  Component Value Date   GLUCAP 127 (H) 05/02/2024     Exercise  Target Goals: Exercise Program Goal: Individual exercise prescription set using results from initial 6 min walk test and THRR while considering  patient's activity barriers and safety.   Exercise Prescription Goal: Initial exercise prescription builds to 30-45 minutes a day of aerobic activity, 2-3 days per week.  Home exercise guidelines will be given to patient during program as part of exercise prescription that the participant will acknowledge.  Activity Barriers & Risk Stratification:  Activity Barriers & Cardiac Risk Stratification - 05/20/24 1122       Activity Barriers & Cardiac Risk Stratification   Activity Barriers Other (comment)    Comments Left hip pain/ spasm since hospitalization.    Cardiac Risk Stratification High          6 Minute Walk:  6 Minute Walk     Row Name 05/20/24 1230         6 Minute Walk   Phase Initial     Distance 971 feet     Walk Time 4.92 minutes     # of Rest Breaks 1  rested the last 1 minute 5 seconds     MPH 2.24     METS 2.57     RPE  9     Perceived Dyspnea  0     VO2 Peak 9     Symptoms Yes (comment)     Comments Left hip pain, 8/10 on pain scale. Chronic with walking since his hospitalization.     Resting HR 92 bpm     Resting BP 100/64     Resting Oxygen Saturation  100 %     Exercise Oxygen Saturation  during 6 min walk 100 %     Max Ex. HR 124 bpm     Max Ex. BP 120/76     2 Minute Post BP 100/64        Oxygen Initial Assessment:   Oxygen Re-Evaluation:   Oxygen Discharge (Final Oxygen Re-Evaluation):   Initial Exercise Prescription:  Initial Exercise Prescription - 05/20/24 1400       Date of Initial Exercise RX and Referring Provider   Date 05/20/24    Referring Provider Ladona Heinz, MD    Expected Discharge Date 08/13/24      Recumbant Bike   Level 1    RPM 30    Watts 17    Minutes 15    METs 2.6      NuStep   Level 1    SPM 85    Minutes 15    METs 2.6      Prescription Details   Frequency (times per week) 3    Duration Progress to 30 minutes of continuous aerobic without signs/symptoms of physical distress      Intensity   THRR 40-80% of Max Heartrate 61-122    Ratings of Perceived Exertion 11-13    Perceived Dyspnea 0-4      Progression   Progression Continue to progress workloads to maintain intensity without signs/symptoms of physical distress.      Resistance Training   Training Prescription Yes    Weight 4 lbs    Reps 10-15          Perform Capillary Blood Glucose checks as needed.  Exercise Prescription Changes:   Exercise Comments:   Exercise Goals and Review:   Exercise Goals     Row Name 05/20/24 1122             Exercise Goals   Increase Physical Activity  Yes       Intervention Provide advice, education, support and counseling about physical activity/exercise needs.;Develop an individualized exercise prescription for aerobic and resistive training based on initial evaluation findings, risk stratification, comorbidities and participant's  personal goals.       Expected Outcomes Short Term: Attend rehab on a regular basis to increase amount of physical activity.;Long Term: Exercising regularly at least 3-5 days a week.;Long Term: Add in home exercise to make exercise part of routine and to increase amount of physical activity.       Increase Strength and Stamina Yes       Intervention Provide advice, education, support and counseling about physical activity/exercise needs.;Develop an individualized exercise prescription for aerobic and resistive training based on initial evaluation findings, risk stratification, comorbidities and participant's personal goals.       Expected Outcomes Short Term: Increase workloads from initial exercise prescription for resistance, speed, and METs.;Short Term: Perform resistance training exercises routinely during rehab and add in resistance training at home;Long Term: Improve cardiorespiratory fitness, muscular endurance and strength as measured by increased METs and functional capacity ( )       Able to understand and use rate of perceived exertion (RPE) scale Yes       Intervention Provide education and explanation on how to use RPE scale       Expected Outcomes Short Term: Able to use RPE daily in rehab to express subjective intensity level;Long Term:  Able to use RPE to guide intensity level when exercising independently       Knowledge and understanding of Target Heart Rate Range (THRR) Yes       Intervention Provide education and explanation of THRR including how the numbers were predicted and where they are located for reference       Expected Outcomes Short Term: Able to state/look up THRR;Long Term: Able to use THRR to govern intensity when exercising independently;Short Term: Able to use daily as guideline for intensity in rehab       Able to check pulse independently Yes       Intervention Provide education and demonstration on how to check pulse in carotid and radial arteries.;Review the  importance of being able to check your own pulse for safety during independent exercise       Expected Outcomes Short Term: Able to explain why pulse checking is important during independent exercise;Long Term: Able to check pulse independently and accurately       Understanding of Exercise Prescription Yes       Intervention Provide education, explanation, and written materials on patient's individual exercise prescription       Expected Outcomes Short Term: Able to explain program exercise prescription;Long Term: Able to explain home exercise prescription to exercise independently          Exercise Goals Re-Evaluation :   Discharge Exercise Prescription (Final Exercise Prescription Changes):   Nutrition:  Target Goals: Understanding of nutrition guidelines, daily intake of sodium 1500mg , cholesterol 200mg , calories 30% from fat and 7% or less from saturated fats, daily to have 5 or more servings of fruits and vegetables.  Biometrics:  Pre Biometrics - 05/20/24 1021       Pre Biometrics   Waist Circumference 43.5 inches    Hip Circumference 43 inches    Waist to Hip Ratio 1.01 %    Triceps Skinfold 18 mm    % Body Fat 29.9 %    Grip Strength 35 kg    Flexibility 10.63 in  Single Leg Stand 30 seconds           Nutrition Therapy Plan and Nutrition Goals:   Nutrition Assessments:  MEDIFICTS Score Key: >=70 Need to make dietary changes  40-70 Heart Healthy Diet <= 40 Therapeutic Level Cholesterol Diet   Flowsheet Row INTENSIVE CARDIAC REHAB ORIENT from 05/20/2024 in [facility] for Heart, Vascular, & Lung Health  Picture Your Plate Total Score on Admission 81   Picture Your Plate Scores: <59 Unhealthy dietary pattern with much room for improvement. 41-50 Dietary pattern unlikely to meet recommendations for good health and room for improvement. 51-60 More healthful dietary pattern, with some room for improvement.  >60 Healthy dietary  pattern, although there may be some specific behaviors that could be improved.    Nutrition Goals Re-Evaluation:   Nutrition Goals Re-Evaluation:   Nutrition Goals Discharge (Final Nutrition Goals Re-Evaluation):   Psychosocial: Target Goals: Acknowledge presence or absence of significant depression and/or stress, maximize coping skills, provide positive support system. Participant is able to verbalize types and ability to use techniques and skills needed for reducing stress and depression.  Initial Review & Psychosocial Screening:  Initial Psych Review & Screening - 05/20/24 1056       Initial Review   Current issues with None Identified      Family Dynamics   Good Support System? Yes    Comments Great support from wife, 2 daughters, and 1 son. 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      Barriers   Psychosocial barriers to participate in program There are no identifiable barriers or psychosocial needs.      Screening Interventions   Interventions Encouraged to exercise    Expected Outcomes Short Term goal: Identification and review with participant of any Quality of Life or Depression concerns found by scoring the questionnaire.;Long Term goal: The participant improves quality of Life and PHQ9 Scores as seen by post scores and/or verbalization of changes          Quality of Life Scores:  Quality of Life - 05/20/24 1418       Quality of Life   Select Quality of Life      Quality of Life Scores   Health/Function Pre 16.6 %    Socioeconomic Pre 26.79 %    Psych/Spiritual Pre 21.36 %    Family Pre 22.6 %    GLOBAL Pre 20.56 %         Scores of 19 and below usually indicate a poorer quality of life in these areas.  A difference of  2-3 points is a clinically meaningful difference.  A difference of 2-3 points in the total score of the Quality of Life Index has been associated with significant improvement in overall quality of life, self-image,  physical symptoms, and general health in studies assessing change in quality of life.  PHQ-9: Review Flowsheet  More data exists      05/20/2024 11/15/2023 05/16/2023 11/14/2022 05/15/2022  Depression screen PHQ 2/9  Decreased Interest 0 0 0 0 0 0  Down, Depressed, Hopeless 0 0 0 0 0 0  PHQ - 2 Score 0 0 0 0 0 0  Altered sleeping 3 0 0 - 0  Tired, decreased energy 2 0 0 - 0  Change in appetite 0 0 0 - 0  Feeling bad or failure about yourself  0 0 0 - 0  Trouble concentrating 0 0 0 - 0  Moving slowly or fidgety/restless  0 0 0 - 0  Suicidal thoughts 0 0 0 - 0  PHQ-9 Score 5 0 0 - 0  Difficult doing work/chores Not difficult at all Not difficult at all Not difficult at all - Not difficult at all    Details       Multiple values from one day are sorted in reverse-chronological order        Interpretation of Total Score  Total Score Depression Severity:  1-4 = Minimal depression, 5-9 = Mild depression, 10-14 = Moderate depression, 15-19 = Moderately severe depression, 20-27 = Severe depression   Psychosocial Evaluation and Intervention:   Psychosocial Re-Evaluation:   Psychosocial Discharge (Final Psychosocial Re-Evaluation):   Vocational Rehabilitation: Provide vocational rehab assistance to qualifying candidates.   Vocational Rehab Evaluation & Intervention:  Vocational Rehab - 05/20/24 1121       Initial Vocational Rehab Evaluation & Intervention   Assessment shows need for Vocational Rehabilitation No      Vocational Rehab Re-Evaulation   Comments Hendry is actively employed and plans to return to his previous job. No VR needs.          Education: Education Goals: Education classes will be provided on a weekly basis, covering required topics. Participant will state understanding/return demonstration of topics presented.     Core Videos: Exercise    Move It!  Clinical staff conducted group or individual video education with verbal and written material and  guidebook.  Patient learns the recommended Pritikin exercise program. Exercise with the goal of living a long, healthy life. Some of the health benefits of exercise include controlled diabetes, healthier blood pressure levels, improved cholesterol levels, improved heart and lung capacity, improved sleep, and better body composition. Everyone should speak with their doctor before starting or changing an exercise routine.  Biomechanical Limitations Clinical staff conducted group or individual video education with verbal and written material and guidebook.  Patient learns how biomechanical limitations can impact exercise and how we can mitigate and possibly overcome limitations to have an impactful and balanced exercise routine.  Body Composition Clinical staff conducted group or individual video education with verbal and written material and guidebook.  Patient learns that body composition (ratio of muscle mass to fat mass) is a key component to assessing overall fitness, rather than body weight alone. Increased fat mass, especially visceral belly fat, can put us  at increased risk for metabolic syndrome, type 2 diabetes, heart disease, and even death. It is recommended to combine diet and exercise (cardiovascular and resistance training) to improve your body composition. Seek guidance from your physician and exercise physiologist before implementing an exercise routine.  Exercise Action Plan Clinical staff conducted group or individual video education with verbal and written material and guidebook.  Patient learns the recommended strategies to achieve and enjoy long-term exercise adherence, including variety, self-motivation, self-efficacy, and positive decision making. Benefits of exercise include fitness, good health, weight management, more energy, better sleep, less stress, and overall well-being.  Medical   Heart Disease Risk Reduction Clinical staff conducted group or individual video education  with verbal and written material and guidebook.  Patient learns our heart is our most vital organ as it circulates oxygen, nutrients, white blood cells, and hormones throughout the entire body, and carries waste away. Data supports a plant-based eating plan like the Pritikin Program for its effectiveness in slowing progression of and reversing heart disease. The video provides a number of recommendations to address heart disease.   Metabolic Syndrome and Belly Fat  Clinical staff conducted group or individual video education with verbal and written material and guidebook.  Patient learns what metabolic syndrome is, how it leads to heart disease, and how one can reverse it and keep it from coming back. You have metabolic syndrome if you have 3 of the following 5 criteria: abdominal obesity, high blood pressure, high triglycerides, low HDL cholesterol, and high blood sugar.  Hypertension and Heart Disease Clinical staff conducted group or individual video education with verbal and written material and guidebook.  Patient learns that high blood pressure, or hypertension, is very common in the [place] . Hypertension is largely due to excessive salt intake, but other important risk factors include being overweight, physical inactivity, drinking too much alcohol, smoking, and not eating enough potassium from fruits and vegetables. High blood pressure is a leading risk factor for heart attack, stroke, congestive heart failure, dementia, kidney failure, and premature death. Long-term effects of excessive salt intake include stiffening of the arteries and thickening of heart muscle and organ damage. Recommendations include ways to reduce hypertension and the risk of heart disease.  Diseases of Our Time - Focusing on Diabetes Clinical staff conducted group or individual video education with verbal and written material and guidebook.  Patient learns why the best way to stop diseases of our time is prevention,  through food and other lifestyle changes. Medicine (such as prescription pills and surgeries) is often only a Band-Aid on the problem, not a long-term solution. Most common diseases of our time include obesity, type 2 diabetes, hypertension, heart disease, and cancer. The Pritikin Program is recommended and has been proven to help reduce, reverse, and/or prevent the damaging effects of metabolic syndrome.  Nutrition   Overview of the Pritikin Eating Plan  Clinical staff conducted group or individual video education with verbal and written material and guidebook.  Patient learns about the Pritikin Eating Plan for disease risk reduction. The Pritikin Eating Plan emphasizes a wide variety of unrefined, minimally-processed carbohydrates, like fruits, vegetables, whole grains, and legumes. Go, Caution, and Stop food choices are explained. Plant-based and lean animal proteins are emphasized. Rationale provided for low sodium intake for blood pressure control, low added sugars for blood sugar stabilization, and low added fats and oils for coronary artery disease risk reduction and weight management.  Calorie Density  Clinical staff conducted group or individual video education with verbal and written material and guidebook.  Patient learns about calorie density and how it impacts the Pritikin Eating Plan. Knowing the characteristics of the food you choose will help you decide whether those foods will lead to weight gain or weight loss, and whether you want to consume more or less of them. Weight loss is usually a side effect of the Pritikin Eating Plan because of its focus on low calorie-dense foods.  Label Reading  Clinical staff conducted group or individual video education with verbal and written material and guidebook.  Patient learns about the Pritikin recommended label reading guidelines and corresponding recommendations regarding calorie density, added sugars, sodium content, and whole grains.  Dining  Out - Part 1  Clinical staff conducted group or individual video education with verbal and written material and guidebook.  Patient learns that restaurant meals can be sabotaging because they can be so high in calories, fat, sodium, and/or sugar. Patient learns recommended strategies on how to positively address this and avoid unhealthy pitfalls.  Facts on Fats  Clinical staff conducted group or individual video education with verbal and written material and guidebook.  Patient learns that lifestyle modifications can be just as effective, if not more so, as many medications for lowering your risk of heart disease. A Pritikin lifestyle can help to reduce your risk of inflammation and atherosclerosis (cholesterol build-up, or plaque, in the artery walls). Lifestyle interventions such as dietary choices and physical activity address the cause of atherosclerosis. A review of the types of fats and their impact on blood cholesterol levels, along with dietary recommendations to reduce fat intake is also included.  Nutrition Action Plan  Clinical staff conducted group or individual video education with verbal and written material and guidebook.  Patient learns how to incorporate Pritikin recommendations into their lifestyle. Recommendations include planning and keeping personal health goals in mind as an important part of their success.  Healthy Mind-Set    Healthy Minds, Bodies, Hearts  Clinical staff conducted group or individual video education with verbal and written material and guidebook.  Patient learns how to identify when they are stressed. Video will discuss the impact of that stress, as well as the many benefits of stress management. Patient will also be introduced to stress management techniques. The way we think, act, and feel has an impact on our hearts.  How Our Thoughts Can Heal Our Hearts  Clinical staff conducted group or individual video education with verbal and written material and  guidebook.  Patient learns that negative thoughts can cause depression and anxiety. This can result in negative lifestyle behavior and serious health problems. Cognitive behavioral therapy is an effective method to help control our thoughts in order to change and improve our emotional outlook.  Additional Videos:  Exercise    Improving Performance  Clinical staff conducted group or individual video education with verbal and written material and guidebook.  Patient learns to use a non-linear approach by alternating intensity levels and lengths of time spent exercising to help burn more calories and lose more body fat. Cardiovascular exercise helps improve heart health, metabolism, hormonal balance, blood sugar control, and recovery from fatigue. Resistance training improves strength, endurance, balance, coordination, reaction time, metabolism, and muscle mass. Flexibility exercise improves circulation, posture, and balance. Seek guidance from your physician and exercise physiologist before implementing an exercise routine and learn your capabilities and proper form for all exercise.  Introduction to Yoga  Clinical staff conducted group or individual video education with verbal and written material and guidebook.  Patient learns about yoga, a discipline of the coming together of mind, breath, and body. The benefits of yoga include improved flexibility, improved range of motion, better posture and core strength, increased lung function, weight loss, and positive self-image. Yoga's heart health benefits include lowered blood pressure, healthier heart rate, decreased cholesterol and triglyceride levels, improved immune function, and reduced stress. Seek guidance from your physician and exercise physiologist before implementing an exercise routine and learn your capabilities and proper form for all exercise.  Medical   Aging: Enhancing Your Quality of Life  Clinical staff conducted group or individual  video education with verbal and written material and guidebook.  Patient learns key strategies and recommendations to stay in good physical health and enhance quality of life, such as prevention strategies, having an advocate, securing a Health Care Proxy and Power of Attorney, and keeping a list of medications and system for tracking them. It also discusses how to avoid risk for bone loss.  Biology of Weight Control  Clinical staff conducted group or individual video education with verbal and written material and guidebook.  Patient learns that  weight gain occurs because we consume more calories than we burn (eating more, moving less). Even if your body weight is normal, you may have higher ratios of fat compared to muscle mass. Too much body fat puts you at increased risk for cardiovascular disease, heart attack, stroke, type 2 diabetes, and obesity-related cancers. In addition to exercise, following the Pritikin Eating Plan can help reduce your risk.  Decoding Lab Results  Clinical staff conducted group or individual video education with verbal and written material and guidebook.  Patient learns that lab test reflects one measurement whose values change over time and are influenced by many factors, including medication, stress, sleep, exercise, food, hydration, pre-existing medical conditions, and more. It is recommended to use the knowledge from this video to become more involved with your lab results and evaluate your numbers to speak with your doctor.   Diseases of Our Time - Overview  Clinical staff conducted group or individual video education with verbal and written material and guidebook.  Patient learns that according to the CDC, 50% to 70% of chronic diseases (such as obesity, type 2 diabetes, elevated lipids, hypertension, and heart disease) are avoidable through lifestyle improvements including healthier food choices, listening to satiety cues, and increased physical activity.  Sleep  Disorders Clinical staff conducted group or individual video education with verbal and written material and guidebook.  Patient learns how good quality and duration of sleep are important to overall health and well-being. Patient also learns about sleep disorders and how they impact health along with recommendations to address them, including discussing with a physician.  Nutrition  Dining Out - Part 2 Clinical staff conducted group or individual video education with verbal and written material and guidebook.  Patient learns how to plan ahead and communicate in order to maximize their dining experience in a healthy and nutritious manner. Included are recommended food choices based on the type of restaurant the patient is visiting.   Fueling a Banker conducted group or individual video education with verbal and written material and guidebook.  There is a strong connection between our food choices and our health. Diseases like obesity and type 2 diabetes are very prevalent and are in large-part due to lifestyle choices. The Pritikin Eating Plan provides plenty of food and hunger-curbing satisfaction. It is easy to follow, affordable, and helps reduce health risks.  Menu Workshop  Clinical staff conducted group or individual video education with verbal and written material and guidebook.  Patient learns that restaurant meals can sabotage health goals because they are often packed with calories, fat, sodium, and sugar. Recommendations include strategies to plan ahead and to communicate with the manager, chef, or server to help order a healthier meal.  Planning Your Eating Strategy  Clinical staff conducted group or individual video education with verbal and written material and guidebook.  Patient learns about the Pritikin Eating Plan and its benefit of reducing the risk of disease. The Pritikin Eating Plan does not focus on calories. Instead, it emphasizes high-quality,  nutrient-rich foods. By knowing the characteristics of the foods, we choose, we can determine their calorie density and make informed decisions.  Targeting Your Nutrition Priorities  Clinical staff conducted group or individual video education with verbal and written material and guidebook.  Patient learns that lifestyle habits have a tremendous impact on disease risk and progression. This video provides eating and physical activity recommendations based on your personal health goals, such as reducing LDL cholesterol, losing weight, preventing or  controlling type 2 diabetes, and reducing high blood pressure.  Vitamins and Minerals  Clinical staff conducted group or individual video education with verbal and written material and guidebook.  Patient learns different ways to obtain key vitamins and minerals, including through a recommended healthy diet. It is important to discuss all supplements you take with your doctor.   Healthy Mind-Set    Smoking Cessation  Clinical staff conducted group or individual video education with verbal and written material and guidebook.  Patient learns that cigarette smoking and tobacco addiction pose a serious health risk which affects millions of people. Stopping smoking will significantly reduce the risk of heart disease, lung disease, and many forms of cancer. Recommended strategies for quitting are covered, including working with your doctor to develop a successful plan.  Culinary   Becoming a Set designer conducted group or individual video education with verbal and written material and guidebook.  Patient learns that cooking at home can be healthy, cost-effective, quick, and puts them in control. Keys to cooking healthy recipes will include looking at your recipe, assessing your equipment needs, planning ahead, making it simple, choosing cost-effective seasonal ingredients, and limiting the use of added fats, salts, and sugars.  Cooking -  Breakfast and Snacks  Clinical staff conducted group or individual video education with verbal and written material and guidebook.  Patient learns how important breakfast is to satiety and nutrition through the entire day. Recommendations include key foods to eat during breakfast to help stabilize blood sugar levels and to prevent overeating at meals later in the day. Planning ahead is also a key component.  Cooking - Educational psychologist conducted group or individual video education with verbal and written material and guidebook.  Patient learns eating strategies to improve overall health, including an approach to cook more at home. Recommendations include thinking of animal protein as a side on your plate rather than center stage and focusing instead on lower calorie dense options like vegetables, fruits, whole grains, and plant-based proteins, such as beans. Making sauces in large quantities to freeze for later and leaving the skin on your vegetables are also recommended to maximize your experience.  Cooking - Healthy Salads and Dressing Clinical staff conducted group or individual video education with verbal and written material and guidebook.  Patient learns that vegetables, fruits, whole grains, and legumes are the foundations of the Pritikin Eating Plan. Recommendations include how to incorporate each of these in flavorful and healthy salads, and how to create homemade salad dressings. Proper handling of ingredients is also covered. Cooking - Soups and [employer] - Soups and Desserts Clinical staff conducted group or individual video education with verbal and written material and guidebook.  Patient learns that Pritikin soups and desserts make for easy, nutritious, and delicious snacks and meal components that are low in sodium, fat, sugar, and calorie density, while high in vitamins, minerals, and filling fiber. Recommendations include simple and healthy ideas for soups and  desserts.   Overview     The Pritikin Solution Program Overview Clinical staff conducted group or individual video education with verbal and written material and guidebook.  Patient learns that the results of the Pritikin Program have been documented in more than 100 articles published in peer-reviewed journals, and the benefits include reducing risk factors for (and, in some cases, even reversing) high cholesterol, high blood pressure, type 2 diabetes, obesity, and more! An overview of the three key pillars of the Pritikin  Program will be covered: eating well, doing regular exercise, and having a healthy mind-set.  WORKSHOPS  Exercise: Exercise Basics: Building Your Action Plan Clinical staff led group instruction and group discussion with PowerPoint presentation and patient guidebook. To enhance the learning environment the use of posters, models and videos may be added. At the conclusion of this workshop, patients will comprehend the difference between physical activity and exercise, as well as the benefits of incorporating both, into their routine. Patients will understand the FITT (Frequency, Intensity, Time, and Type) principle and how to use it to build an exercise action plan. In addition, safety concerns and other considerations for exercise and cardiac rehab will be addressed by the presenter. The purpose of this lesson is to promote a comprehensive and effective weekly exercise routine in order to improve patients' overall level of fitness.   Managing Heart Disease: Your Path to a Healthier Heart Clinical staff led group instruction and group discussion with PowerPoint presentation and patient guidebook. To enhance the learning environment the use of posters, models and videos may be added.At the conclusion of this workshop, patients will understand the anatomy and physiology of the heart. Additionally, they will understand how Pritikin's three pillars impact the risk factors, the  progression, and the management of heart disease.  The purpose of this lesson is to provide a high-level overview of the heart, heart disease, and how the Pritikin lifestyle positively impacts risk factors.  Exercise Biomechanics Clinical staff led group instruction and group discussion with PowerPoint presentation and patient guidebook. To enhance the learning environment the use of posters, models and videos may be added. Patients will learn how the structural parts of their bodies function and how these functions impact their daily activities, movement, and exercise. Patients will learn how to promote a neutral spine, learn how to manage pain, and identify ways to improve their physical movement in order to promote healthy living. The purpose of this lesson is to expose patients to common physical limitations that impact physical activity. Participants will learn practical ways to adapt and manage aches and pains, and to minimize their effect on regular exercise. Patients will learn how to maintain good posture while sitting, walking, and lifting.  Balance Training and Fall Prevention  Clinical staff led group instruction and group discussion with PowerPoint presentation and patient guidebook. To enhance the learning environment the use of posters, models and videos may be added. At the conclusion of this workshop, patients will understand the importance of their sensorimotor skills (vision, proprioception, and the vestibular system) in maintaining their ability to balance as they age. Patients will apply a variety of balancing exercises that are appropriate for their current level of function. Patients will understand the common causes for poor balance, possible solutions to these problems, and ways to modify their physical environment in order to minimize their fall risk. The purpose of this lesson is to teach patients about the importance of maintaining balance as they age and ways to  minimize their risk of falling.  WORKSHOPS   Nutrition:  Fueling a Ship broker led group instruction and group discussion with PowerPoint presentation and patient guidebook. To enhance the learning environment the use of posters, models and videos may be added. Patients will review the foundational principles of the Pritikin Eating Plan and understand what constitutes a serving size in each of the food groups. Patients will also learn Pritikin-friendly foods that are better choices when away from home and review make-ahead meal and snack options.  Calorie density will be reviewed and applied to three nutrition priorities: weight maintenance, weight loss, and weight gain. The purpose of this lesson is to reinforce (in a group setting) the key concepts around what patients are recommended to eat and how to apply these guidelines when away from home by planning and selecting Pritikin-friendly options. Patients will understand how calorie density may be adjusted for different weight management goals.  Mindful Eating  Clinical staff led group instruction and group discussion with PowerPoint presentation and patient guidebook. To enhance the learning environment the use of posters, models and videos may be added. Patients will briefly review the concepts of the Pritikin Eating Plan and the importance of low-calorie dense foods. The concept of mindful eating will be introduced as well as the importance of paying attention to internal hunger signals. Triggers for non-hunger eating and techniques for dealing with triggers will be explored. The purpose of this lesson is to provide patients with the opportunity to review the basic principles of the Pritikin Eating Plan, discuss the value of eating mindfully and how to measure internal cues of hunger and fullness using the Hunger Scale. Patients will also discuss reasons for non-hunger eating and learn strategies to use for controlling emotional  eating.  Targeting Your Nutrition Priorities Clinical staff led group instruction and group discussion with PowerPoint presentation and patient guidebook. To enhance the learning environment the use of posters, models and videos may be added. Patients will learn how to determine their genetic susceptibility to disease by reviewing their family history. Patients will gain insight into the importance of diet as part of an overall healthy lifestyle in mitigating the impact of genetics and other environmental insults. The purpose of this lesson is to provide patients with the opportunity to assess their personal nutrition priorities by looking at their family history, their own health history and current risk factors. Patients will also be able to discuss ways of prioritizing and modifying the Pritikin Eating Plan for their highest risk areas  Menu  Clinical staff led group instruction and group discussion with PowerPoint presentation and patient guidebook. To enhance the learning environment the use of posters, models and videos may be added. Using menus brought in from [employer], or printed from [employer], patients will apply the Pritikin dining out guidelines that were presented in the [employer] video. Patients will also be able to practice these guidelines in a variety of provided scenarios. The purpose of this lesson is to provide patients with the opportunity to practice hands-on learning of the Pritikin Dining Out guidelines with actual menus and practice scenarios.  Label Reading Clinical staff led group instruction and group discussion with PowerPoint presentation and patient guidebook. To enhance the learning environment the use of posters, models and videos may be added. Patients will review and discuss the Pritikin label reading guidelines presented in Pritikin's Label Reading Educational series video. Using fool labels brought in from local grocery stores and  markets, patients will apply the label reading guidelines and determine if the packaged food meet the Pritikin guidelines. The purpose of this lesson is to provide patients with the opportunity to review, discuss, and practice hands-on learning of the Pritikin Label Reading guidelines with actual packaged food labels. Cooking School  Pritikin's [employer] are designed to teach patients ways to prepare quick, simple, and affordable recipes at home. The importance of nutrition's role in chronic disease risk reduction is reflected in its emphasis in the overall Pritikin program. By  learning how to prepare essential core Pritikin Eating Plan recipes, patients will increase control over what they eat; be able to customize the flavor of foods without the use of added salt, sugar, or fat; and improve the quality of the food they consume. By learning a set of core recipes which are easily assembled, quickly prepared, and affordable, patients are more likely to prepare more healthy foods at home. These workshops focus on convenient breakfasts, simple entres, side dishes, and desserts which can be prepared with minimal effort and are consistent with nutrition recommendations for cardiovascular risk reduction. Cooking [employer] are taught by a Armed forces logistics/support/administrative officer (RD) who has been trained by the [employer]. The chef or RD has a clear understanding of the importance of minimizing - if not completely eliminating - added fat, sugar, and sodium in recipes. Throughout the series of Cooking School Workshop sessions, patients will learn about healthy ingredients and efficient methods of cooking to build confidence in their capability to prepare    Cooking School weekly topics:  Adding Flavor- Sodium-Free  Fast and Healthy Breakfasts  Powerhouse Plant-Based Proteins  Satisfying Salads and Dressings  Simple Sides and Sauces  International Cuisine-Spotlight on the [employer]  Zones  Delicious Desserts  Savory Soups  [employer] - Meals in a Astronomer Appetizers and Snacks  Comforting Weekend Breakfasts  One-Pot Wonders   Fast Evening Meals  Landscape architect Your Pritikin Plate  WORKSHOPS   Healthy Mindset (Psychosocial):  Focused Goals, Sustainable Changes Clinical staff led group instruction and group discussion with PowerPoint presentation and patient guidebook. To enhance the learning environment the use of posters, models and videos may be added. Patients will be able to apply effective goal setting strategies to establish at least one personal goal, and then take consistent, meaningful action toward that goal. They will learn to identify common barriers to achieving personal goals and develop strategies to overcome them. Patients will also gain an understanding of how our mind-set can impact our ability to achieve goals and the importance of cultivating a positive and growth-oriented mind-set. The purpose of this lesson is to provide patients with a deeper understanding of how to set and achieve personal goals, as well as the tools and strategies needed to overcome common obstacles which may arise along the way.  From Head to Heart: The Power of a Healthy Outlook  Clinical staff led group instruction and group discussion with PowerPoint presentation and patient guidebook. To enhance the learning environment the use of posters, models and videos may be added. Patients will be able to recognize and describe the impact of emotions and mood on physical health. They will discover the importance of self-care and explore self-care practices which may work for them. Patients will also learn how to utilize the 4 C's to cultivate a healthier outlook and better manage stress and challenges. The purpose of this lesson is to demonstrate to patients how a healthy outlook is an essential part of maintaining good health, especially as they continue their  cardiac rehab journey.  Healthy Sleep for a Healthy Heart Clinical staff led group instruction and group discussion with PowerPoint presentation and patient guidebook. To enhance the learning environment the use of posters, models and videos may be added. At the conclusion of this workshop, patients will be able to demonstrate knowledge of the importance of sleep to overall health, well-being, and quality of life. They will understand the symptoms of, and treatments for, common sleep  disorders. Patients will also be able to identify daytime and nighttime behaviors which impact sleep, and they will be able to apply these tools to help manage sleep-related challenges. The purpose of this lesson is to provide patients with a general overview of sleep and outline the importance of quality sleep. Patients will learn about a few of the most common sleep disorders. Patients will also be introduced to the concept of "sleep hygiene," and discover ways to self-manage certain sleeping problems through simple daily behavior changes. Finally, the workshop will motivate patients by clarifying the links between quality sleep and their goals of heart-healthy living.   Recognizing and Reducing Stress Clinical staff led group instruction and group discussion with PowerPoint presentation and patient guidebook. To enhance the learning environment the use of posters, models and videos may be added. At the conclusion of this workshop, patients will be able to understand the types of stress reactions, differentiate between acute and chronic stress, and recognize the impact that chronic stress has on their health. They will also be able to apply different coping mechanisms, such as reframing negative self-talk. Patients will have the opportunity to practice a variety of stress management techniques, such as deep abdominal breathing, progressive muscle relaxation, and/or guided imagery.  The purpose of this lesson is to educate  patients on the role of stress in their lives and to provide healthy techniques for coping with it.  Learning Barriers/Preferences:  Learning Barriers/Preferences - 05/20/24 1055       Learning Barriers/Preferences   Learning Barriers None    Learning Preferences Computer/Internet          Education Topics:  Knowledge Questionnaire Score:  Knowledge Questionnaire Score - 05/20/24 1419       Knowledge Questionnaire Score   Pre Score 24/24          Core Components/Risk Factors/Patient Goals at Admission:  Personal Goals and Risk Factors at Admission - 05/20/24 1122       Core Components/Risk Factors/Patient Goals on Admission   Heart Failure Yes    Intervention Provide a combined exercise and nutrition program that is supplemented with education, support and counseling about heart failure. Directed toward relieving symptoms such as shortness of breath, decreased exercise tolerance, and extremity edema.    Expected Outcomes Improve functional capacity of life;Short term: Attendance in program 2-3 days a week with increased exercise capacity. Reported lower sodium intake. Reported increased fruit and vegetable intake. Reports medication compliance.;Short term: Daily weights obtained and reported for increase. Utilizing diuretic protocols set by physician.;Long term: Adoption of self-care skills and reduction of barriers for early signs and symptoms recognition and intervention leading to self-care maintenance.    Lipids Yes    Intervention Provide education and support for participant on nutrition & aerobic/resistive exercise along with prescribed medications to achieve LDL 70mg , HDL >40mg .    Expected Outcomes Short Term: Participant states understanding of desired cholesterol values and is compliant with medications prescribed. Participant is following exercise prescription and nutrition guidelines.;Long Term: Cholesterol controlled with medications as prescribed, with  individualized exercise RX and with personalized nutrition plan. Value goals: LDL < 70mg , HDL > 40 mg.    Personal Goal Other Yes    Personal Goal Increase daily physical activity level. Improve results on all blood panels. Increase heart strength to acceptable levels to ensure self-sufficiency.    Intervention Develop and individualized exercise action plan that Riyaan can do at home to help improve cardiac function and ability to remain self-sufficient. Develop a  nutrition action plan that [place]area can follow to help with lipid level and other pertinent blood panels.    Expected Outcomes Peregrine will comply with and maintain exercise and nutrition action plan to help improve cardiac function and overall health.          Core Components/Risk Factors/Patient Goals Review:    Core Components/Risk Factors/Patient Goals at Discharge (Final Review):    ITP Comments:  ITP Comments     Row Name 05/20/24 1021           ITP Comments Medical Director- Dr. Wilbert Bihari, MD. Introduction to the Pritikin Education/ Intensive Cardiac Rehab Program. Reviewed initial orientation folder with Will.          Comments: Hersey attended orientation for the cardiac rehabilitation program on  05/20/2024  to perform initial intake and exercise walk test. He was introduced to the [employer] education and orientation packet was reviewed. Completed 6-minute walk test, measurements, initial ITP, and exercise prescription. Vital signs stable. Telemetry-normal sinus rhythm, asymptomatic. He did stop due to severe left hip pain, which he's had since his hospitalization that presents only with walking. Patient's PCP Dr. Everitt Cuba's office contacted and appointment scheduled for tomorrow regarding left hip pain with walking.  Service time was from 1021 to 1249. [name], MS, ACSM CEP 05/20/24 1426

## 2024-05-20 NOTE — Progress Notes (Signed)
 Cardiac Rehab Medication Review   Does the patient  feel that his/her medications are working for him/her?  Not sure  Has the patient been experiencing any side effects to the medications prescribed?  Not sure  Does the patient measure his/her own blood pressure or blood glucose at home?  Yes, check BP twice daily  Does the patient have any problems obtaining medications due to transportation or finances?   no  Understanding of regimen: good Understanding of indications: good Potential of compliance: excellent    Comments: Andre Cowan has had a cough and left hip pain/ spasm since his hospitalization. Undetermined if this is a medication side effect or other issue. Advised him to discuss with his physician.     Andre Cowan 05/20/2024 10:46 AM

## 2024-05-21 ENCOUNTER — Ambulatory Visit (HOSPITAL_BASED_OUTPATIENT_CLINIC_OR_DEPARTMENT_OTHER): Admitting: Family Medicine

## 2024-05-21 ENCOUNTER — Ambulatory Visit (INDEPENDENT_AMBULATORY_CARE_PROVIDER_SITE_OTHER)

## 2024-05-21 VITALS — BP 99/64 | HR 99 | Ht 70.0 in | Wt 200.0 lb

## 2024-05-21 DIAGNOSIS — M25552 Pain in left hip: Secondary | ICD-10-CM

## 2024-05-21 NOTE — Progress Notes (Signed)
    Procedures performed today:    None.  Independent interpretation of notes and tests performed by another provider:   None.  Brief History, Exam, Impression, and Recommendations:    BP 99/64 (BP Location: Right Arm, Patient Position: Sitting, Cuff Size: Normal)   Pulse 99   Ht 5' 10 (1.778 m)   Wt 200 lb (90.7 kg)   SpO2 97%   BMI 28.70 kg/m   Left hip pain Assessment & Plan: Patient reports that he was experiencing left hip pain at recent cardiac rehab evaluation.  As part of evaluation, he did have a 6-minute walk test, however just before the 5-minute mark, he had more severe left hip pain over lateral aspect of hip which forced him to have to take a break and stop walking.  Pain was a sharp, cramping sensation.  He has had occasional pain in the past, however has not typically had to have sustained activity such as the walking during rehab evaluation.  Previously, he may note similar hip pain which would resolve with rest.  He has not had any associated numbness or tingling.  Pain may occasionally radiate down back of leg towards the knee.  He will sometimes have pain on left side when he is laying on the side in bed.  Denies any pain at rest. On exam, patient is in no acute distress, vital signs stable.  He does not have any significant tenderness to palpation along lateral aspect of leg and hip, no specific tenderness to palpation along IT band.  No significant tenderness palpation over left greater trochanter, mild discomfort more proximally into gluteus.  Normal strength with resisted hip abduction and adduction.  Negative FABER and FADIR.  Negative logroll. Given history and exam, I do feel that patient would likely benefit from further evaluation and treatment with physical therapy.  Ultimately, from a cardiac rehab standpoint, I do not feel that he would have any restrictions in proceeding with cardiac rehab program.  We will also reach out to cardiac rehab department to ensure  that no additional information or documentation is needed in order for them to proceed with rehab for patient.  Orders: -     DG HIP UNILAT W OR W/O PELVIS 2-3 VIEWS LEFT; Future -     Ambulatory referral to Physical Therapy  We did reach out to cardiac rehab department and today did not need specific documentation clearing patient to proceed with cardiac rehab program.  Return in about 3 months (around 08/21/2024), or if symptoms worsen or fail to improve.   ___________________________________________ Sian Rockers de [place], MD, ABFM, CAQSM [facility]

## 2024-05-22 ENCOUNTER — Ambulatory Visit: Attending: Family Medicine | Admitting: Physical Therapy

## 2024-05-22 ENCOUNTER — Encounter: Payer: Self-pay | Admitting: Physical Therapy

## 2024-05-22 ENCOUNTER — Other Ambulatory Visit: Payer: Self-pay

## 2024-05-22 DIAGNOSIS — M25652 Stiffness of left hip, not elsewhere classified: Secondary | ICD-10-CM | POA: Diagnosis not present

## 2024-05-22 DIAGNOSIS — R252 Cramp and spasm: Secondary | ICD-10-CM | POA: Diagnosis not present

## 2024-05-22 DIAGNOSIS — M25552 Pain in left hip: Secondary | ICD-10-CM | POA: Insufficient documentation

## 2024-05-22 NOTE — Therapy (Signed)
 OUTPATIENT PHYSICAL THERAPY LOWER EXTREMITY EVALUATION   Patient Name: Andre Cowan MRN: [id number] DOB:05/08/1956, 68 y.o., male Today's Date: 05/22/2024  END OF SESSION:  PT End of Session - 05/22/24 1720     Visit Number 1    Date for Recertification  07/18/24    Authorization Type [employer]BS (no auth req)    PT Start Time 0205    PT Stop Time 0244    PT Time Calculation (min) 39 min    Activity Tolerance Patient tolerated treatment well    Behavior During Therapy [facility] for tasks assessed/performed          Past Medical History:  Diagnosis Date   Cellulitis of neck 12/13/2021   Headache    Migraines   Prostate cancer (HCC)    Skin cancer screening 12/13/2021   Past Surgical History:  Procedure Laterality Date   CERVICAL DISCECTOMY     CORONARY/GRAFT ACUTE MI REVASCULARIZATION N/A 05/02/2024   Procedure: Coronary/Graft Acute MI Revascularization;  Surgeon: Ladona Heinz, MD;  Location: [facility] INVASIVE CV LAB;  Service: Cardiovascular;  Laterality: N/A;   CYST EXCISION  1976   Spine   IABP INSERTION N/A 05/02/2024   Procedure: IABP Insertion;  Surgeon: Ladona Heinz, MD;  Location: [facility] INVASIVE CV LAB;  Service: Cardiovascular;  Laterality: N/A;   LEFT HEART CATH AND CORONARY ANGIOGRAPHY N/A 05/02/2024   Procedure: LEFT HEART CATH AND CORONARY ANGIOGRAPHY;  Surgeon: Ladona Heinz, MD;  Location: [facility] INVASIVE CV LAB;  Service: Cardiovascular;  Laterality: N/A;   LYMPHADENECTOMY Bilateral 04/05/2018   Procedure: LYMPHADENECTOMY;  Surgeon: Alvaro Hummer, MD;  Location: [facility] ORS;  Service: Urology;  Laterality: Bilateral;   PROSTATE BIOPSY     RIGHT HEART CATH N/A 05/02/2024   Procedure: RIGHT HEART CATH;  Surgeon: Ladona Heinz, MD;  Location: [facility] INVASIVE CV LAB;  Service: Cardiovascular;  Laterality: N/A;   ROBOT ASSISTED LAPAROSCOPIC RADICAL PROSTATECTOMY N/A 04/05/2018   Procedure: XI ROBOTIC ASSISTED LAPAROSCOPIC RADICAL PROSTATECTOMY;  Surgeon: Alvaro Hummer, MD;  Location: [facility] ORS;  Service:  Urology;  Laterality: N/A;   TONSILLECTOMY     Patient Active Problem List   Diagnosis Date Noted   Left hip pain 05/21/2024   Acute ST elevation myocardial infarction involving left anterior descending coronary artery (HCC) 05/02/2024   Wellness examination 11/14/2021   Prediabetes 11/14/2021   Dyslipidemia 11/14/2021   History of prostate cancer 08/17/2021   History of tobacco use 08/17/2021    PCP: de [place], Quintin PARAS, MD  REFERRING PROVIDER: de [place], Raymond J, MD  REFERRING DIAG:  Diagnosis  M25.552 (ICD-10-CM) - Left hip pain    THERAPY DIAG:  Pain in left hip  Stiffness of left hip, not elsewhere classified  Cramp and spasm  Rationale for Evaluation and Treatment: Rehabilitation  ONSET DATE: Sept 22  SUBJECTIVE:   SUBJECTIVE STATEMENT: Patient presents with left hip pain that started after he was discharged from the hospital in September 2025 . He was told to ease into physical activity in 10 minute increments on flat surfaces. He was walking at the park which was not flat. He feels like his hip tightens up and it makes it difficult to walk. After taking a seated rest break he is able to walk again. This past Monday he started cardiac rehab and with the he was only able to walk 4 of the minutes due to his hip locking up.  He will be going to cardiac rehab 3x a week. Patient reports no exercise restrictions PERTINENT  HISTORY: Hx prostate cancer; pre-diabetes; myocadial infarction 05/02/2024 PAIN:  Are you having pain? Yes: NPRS scale: 0(currently) 7(worst)/10 Pain location: left glute and radiates down the back of his leg that stops at his knee Pain description: dull; feels like a knot Aggravating factors: walking > 4 mins Relieving factors: sitting and taking pressure off left hip  PRECAUTIONS: Other: Recent MI 05/04/2024 patient reports no exercise precautions  RED FLAGS: None   WEIGHT BEARING RESTRICTIONS: No  FALLS:  Has patient fallen in last 6  months? 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  DIAGNOSTIC FINDINGS: Ordered 10/8 results not available yet  PATIENT SURVEYS:  LEFS 53/80 66.3%   COGNITION: Overall cognitive status: Within functional limits for tasks assessed     SENSATION: WFL    MUSCLE LENGTH: Hamstrings: decreased bilateral Lt > Rt    POSTURE: No Significant postural limitations  PALPATION: Increased muscle spasms and tenderness to left glutes, piriformis, hamstring insertion   LOWER EXTREMITY ROM: Limited PROM hip IR bilateral ; right hip PROM limited in all directions. Uncomfortable sensation with FABER on Lt    LOWER EXTREMITY FFU:Hmnddob 4+/5 bilateral no left hip pain with testing   FUNCTIONAL TESTS:  5 times sit to stand: 11.37 sec hand on knees; no left hip pain Timed up and go (TUG): 8.14 sec                                                                                                                             TREATMENT DATE: 05/22/2024 Initial Evaluation & HEP created    PATIENT EDUCATION:  Education details: PT eval findings, anticipated POC, progress with PT, and initial HEP Person educated: Patient Education method: Explanation, Demonstration, and Handouts Education comprehension: verbalized understanding, returned  demonstration, and needs further education  HOME EXERCISE PROGRAM: Access Code: [email or web address] URL: [email or web address] Date: 05/22/2024 Prepared by: Kristeen Sar  Exercises - Supine Hip Internal and External Rotation  - 1-2 x daily - 7 x weekly - 1 sets - 10 reps - 5-10s hold - Supine Gluteus Stretch  - 1-2 x daily - 7 x weekly - 2 sets - 20-30sec hold - Seated Hamstring Stretch  - 1 x daily - 7 x weekly - 2 sets - 20-30s hold - Standing Hamstring Stretch on Chair  - 1 x daily - 7 x weekly - 2 sets - 20-30s hold -  Seated Figure 4 Piriformis Stretch  - 1 x daily - 7 x weekly - 2 sets - 20-30s hold  ASSESSMENT:  CLINICAL IMPRESSION: Patient is a 68 y.o. male who was seen today for physical therapy evaluation and treatment for left hip pain. Amaziah presents to skilled therapy with left hip pain after being hospitalized after having a MI in September 2025. When ambulating patient feels like his hip locks up and he has to take a seated rest break before continuing. He began cardiac rehab this week and he said he was not able to complete the full due to his hip locking up. Based on evaluation noted decreased ROM, increased muscle spasms, and muscle weakness. Patient is highly motivated and want to improve left hip function to participate fully in cardiac rehab. Patient will benefit from skilled PT to address the below impairments and improve overall function.   OBJECTIVE IMPAIRMENTS: decreased activity tolerance, decreased mobility, difficulty walking, decreased ROM, decreased strength, hypomobility, increased muscle spasms, impaired flexibility, impaired tone, and pain.   ACTIVITY LIMITATIONS: sleeping, dressing, and locomotion level  PARTICIPATION LIMITATIONS: interpersonal relationship, shopping, community activity, and walking for exercise  PERSONAL FACTORS: Fitness and 1-2 comorbidities: pre-diabetes; myocadial infarction 05/02/2024 are also affecting patient's functional  outcome.   REHAB POTENTIAL: Good  CLINICAL DECISION MAKING: Stable/uncomplicated  EVALUATION COMPLEXITY: Moderate   GOALS: Goals reviewed with patient? Yes  SHORT TERM GOALS: Target date: 06/19/2024 Patient will be independent with initial HEP. Baseline:  Goal status: INITIAL  2.  Patient will report > or = to 30% improvement in left hip stiffness since starting PT. Baseline:  Goal status: INITIAL  3.  Patient will be able to participate in a without his left hip locking up. Baseline:  Goal status: INITIAL   LONG TERM GOALS: Target date: 07/18/2024 Patient will demonstrate independence in advanced HEP. Baseline:  Goal status: INITIAL  2.  Patient will report > or = to 70% improvement in hip stiffness since starting PT. Baseline:  Goal status: INITIAL  3.  Patient will be able to participate in all activities at cardiac rehab with no limitations from his hip. Baseline:  Goal status: INITIAL  4.  Patient will be able to walk for at least 15-20 mins with < or = to 2/10 hip pain to improve cardiovascular endurance. Baseline:  Goal status: INITIAL  5.  Patient will score > or = to 65/80 on LEFS due to improved function and decreased disability. Baseline: 53/80 Goal status: INITIAL    PLAN:  PT FREQUENCY: 2x/week  PT DURATION: 8 weeks  PLANNED INTERVENTIONS: 97164- PT Re-evaluation, 97110-Therapeutic exercises, 97530- Therapeutic activity, 97112- Neuromuscular re-education, 97535- Self Care, [id number]- Manual therapy, [phone]- Gait training, [phone]- Canalith repositioning, [id number]- Aquatic Therapy, [phone]- Vasopneumatic device, [id number]- Ultrasound, [id number]- Ionotophoresis 4mg /ml Dexamethasone , 79439 (1-2 muscles), [id number] (3+ muscles)- Dry Needling, Patient/Family education, Balance training, Stair training, Taping, Joint mobilization, Joint manipulation, Spinal manipulation, Spinal mobilization, Vestibular training, Cryotherapy, and Moist heat  PLAN FOR NEXT SESSION: Review  HEP; hip & lumbar flexibility; NuStep; standing leg swings ;    Kristeen Sar, PT 05/22/24 5:20 PM [facility] [address] [place], [place] [address] Phone # [phone] Fax [phone]

## 2024-05-22 NOTE — Assessment & Plan Note (Addendum)
 Patient reports that he was experiencing left hip pain at recent cardiac rehab evaluation.  As part of evaluation, he did have a 6-minute walk test, however just before the 5-minute mark, he had more severe left hip pain over lateral aspect of hip which forced him to have to take a break and stop walking.  Pain was a sharp, cramping sensation.  He has had occasional pain in the past, however has not typically had to have sustained activity such as the walking during rehab evaluation.  Previously, he may note similar hip pain which would resolve with rest.  He has not had any associated numbness or tingling.  Pain may occasionally radiate down back of leg towards the knee.  He will sometimes have pain on left side when he is laying on the side in bed.  Denies any pain at rest. On exam, patient is in no acute distress, vital signs stable.  He does not have any significant tenderness to palpation along lateral aspect of leg and hip, no specific tenderness to palpation along IT band.  No significant tenderness palpation over left greater trochanter, mild discomfort more proximally into gluteus.  Normal strength with resisted hip abduction and adduction.  Negative FABER and FADIR.  Negative logroll. Given history and exam, I do feel that patient would likely benefit from further evaluation and treatment with physical therapy.  Ultimately, from a cardiac rehab standpoint, I do not feel that he would have any restrictions in proceeding with cardiac rehab program.  We will also reach out to cardiac rehab department to ensure that no additional information or documentation is needed in order for them to proceed with rehab for patient.

## 2024-05-23 NOTE — Progress Notes (Signed)
 Andre Cowan                                          MRN: [id number]   05/23/2024   The [facility] Quality Team Specialist reviewed this patient medical record for the purposes of chart review for care gap closure. The following were reviewed: chart review for care gap closure-colorectal cancer screening.    [facility]BCI Quality Team

## 2024-05-26 ENCOUNTER — Encounter (HOSPITAL_COMMUNITY)
Admission: RE | Admit: 2024-05-26 | Discharge: 2024-05-26 | Disposition: A | Discharge status: Home / Self Care | Source: Ambulatory Visit | Attending: Cardiology | Admitting: Cardiology

## 2024-05-26 DIAGNOSIS — Z955 Presence of coronary angioplasty implant and graft: Secondary | ICD-10-CM

## 2024-05-26 DIAGNOSIS — I213 ST elevation (STEMI) myocardial infarction of unspecified site: Secondary | ICD-10-CM | POA: Diagnosis not present

## 2024-05-26 NOTE — Progress Notes (Signed)
 Daily Session Note  Patient Details  Name: Andre Cowan MRN: [id number] Date of Birth: 05/10/1956 Referring Provider:   Flowsheet Row INTENSIVE CARDIAC REHAB ORIENT from 05/20/2024 in [facility]  Referring Provider Andre Heinz, MD    Encounter Date: 05/26/2024  Check In:  Session Check In - 05/26/24 1002       Check-In   Supervising physician immediately available to respond to emergencies [facility] MD immediately available    Physician(s) Andre Fabry, PA-C    Location [facility]    Staff Present Andre Quan, RN, Andre Gal, MS, ACSM-CEP, Exercise Physiologist;Andre Fayette, MS, Exercise Physiologist;Andre Janann, MS, ACSM-CEP, CCRP, Exercise Physiologist;Andre Lennon, RN, BSN    Virtual Visit No    Medication changes reported     No    Fall or balance concerns reported    No    Tobacco Cessation No Change    Warm-up and Cool-down Performed as group-led instruction    Resistance Training Performed Yes    VAD Patient? No    PAD/SET Patient? No      Pain Assessment   Currently in Pain? No/denies    Pain Score 0-No pain    Multiple Pain Sites No          Capillary Blood Glucose: No results found for this or any previous visit (from the past 24 hours).   Exercise Prescription Changes - 05/26/24 1037       Response to Exercise   Blood Pressure (Admit) 94/56    Blood Pressure (Exercise) 144/78    Blood Pressure (Exit) 97/68    Heart Rate (Admit) 97 bpm    Heart Rate (Exercise) 131 bpm    Heart Rate (Exit) 106 bpm    Rating of Perceived Exertion (Exercise) 13    Symptoms None    Comments Off to a good start with exercise.    Duration Continue with 30 min of aerobic exercise without signs/symptoms of physical distress.    Intensity THRR unchanged      Progression   Progression Continue to progress workloads to maintain intensity without signs/symptoms of physical distress.     Average METs 1.9      Resistance Training   Training Prescription Yes    Weight 4 lbs    Reps 10-15    Time 5 Minutes      Interval Training   Interval Training No      Recumbant Bike   Level 1    Watts 14    Minutes 15    METs 2.1      NuStep   Level 1    SPM 64    Minutes 15    METs 1.7          Social History   Tobacco Use  Smoking Status Some Days   Current packs/day: 0.00   Average packs/day: 0.5 packs/day for 40.0 years (20.0 ttl pk-yrs)   Types: Cigarettes   Start date: 09/15/1975   Last attempt to quit: 09/15/2015   Years since quitting: 8.7   Passive exposure: Current  Smokeless Tobacco Never    Goals Met:  Exercise tolerated well No report of concerns or symptoms today Strength training completed today  Goals Unmet:  Not Applicable  Comments: Pt started cardiac rehab today.  Pt tolerated light exercise without difficulty. VSS, telemetry-Sinus Rhythm, asymptomatic.  Medication list reconciled. Pt denies barriers to medicaiton compliance.  PSYCHOSOCIAL ASSESSMENT:  PHQ-5. Andre Cowan saw  Andre Cowan and is going to have physical therapy for his hip. Pt exhibits positive coping skills, hopeful outlook with supportive family. No psychosocial needs identified at this time, no psychosocial interventions necessary.   Pt oriented to exercise equipment and routine.    Understanding verbalized. Andre Elpidio Quan RN BSN    Andre Cowan is Medical Director for Cardiac Rehab at [facility].

## 2024-05-27 ENCOUNTER — Ambulatory Visit: Admitting: Physical Therapy

## 2024-05-27 ENCOUNTER — Ambulatory Visit (HOSPITAL_BASED_OUTPATIENT_CLINIC_OR_DEPARTMENT_OTHER): Payer: Self-pay | Admitting: Family Medicine

## 2024-05-27 DIAGNOSIS — M25652 Stiffness of left hip, not elsewhere classified: Secondary | ICD-10-CM

## 2024-05-27 DIAGNOSIS — R252 Cramp and spasm: Secondary | ICD-10-CM

## 2024-05-27 DIAGNOSIS — M25552 Pain in left hip: Secondary | ICD-10-CM | POA: Diagnosis not present

## 2024-05-27 NOTE — Therapy (Signed)
 OUTPATIENT PHYSICAL THERAPY LOWER EXTREMITY TREATMENT   Patient Name: Andre Cowan MRN: [id number] DOB:Jul 31, 1956, 69 y.o., male Today's Date: 05/27/2024  END OF SESSION:  PT End of Session - 05/27/24 1616     Visit Number 2    Date for Recertification  07/18/24    Authorization Type BCBS (no auth req)    PT Start Time 1531    PT Stop Time 1610    PT Time Calculation (min) 39 min    Activity Tolerance Patient tolerated treatment well    Behavior During Therapy [facility] for tasks assessed/performed           Past Medical History:  Diagnosis Date   Cellulitis of neck 12/13/2021   Headache    Migraines   Prostate cancer (HCC)    Skin cancer screening 12/13/2021   Past Surgical History:  Procedure Laterality Date   CERVICAL DISCECTOMY     CORONARY/GRAFT ACUTE MI REVASCULARIZATION N/A 05/02/2024   Procedure: Coronary/Graft Acute MI Revascularization;  Surgeon: Ladona Heinz, MD;  Location: [facility];  Service: Cardiovascular;  Laterality: N/A;   CYST EXCISION  1976   Spine   IABP INSERTION N/A 05/02/2024   Procedure: IABP Insertion;  Surgeon: Ladona Heinz, MD;  Location: [facility];  Service: Cardiovascular;  Laterality: N/A;   LEFT HEART CATH AND CORONARY ANGIOGRAPHY N/A 05/02/2024   Procedure: LEFT HEART CATH AND CORONARY ANGIOGRAPHY;  Surgeon: Ladona Heinz, MD;  Location: [facility];  Service: Cardiovascular;  Laterality: N/A;   LYMPHADENECTOMY Bilateral 04/05/2018   Procedure: LYMPHADENECTOMY;  Surgeon: Alvaro Hummer, MD;  Location: [facility];  Service: Urology;  Laterality: Bilateral;   PROSTATE BIOPSY     RIGHT HEART CATH N/A 05/02/2024   Procedure: RIGHT HEART CATH;  Surgeon: Ladona Heinz, MD;  Location: [facility] INVASIVE CV LAB;  Service: Cardiovascular;  Laterality: N/A;   ROBOT ASSISTED LAPAROSCOPIC RADICAL PROSTATECTOMY N/A 04/05/2018   Procedure: XI ROBOTIC ASSISTED LAPAROSCOPIC RADICAL PROSTATECTOMY;  Surgeon: Alvaro Hummer, MD;  Location: [facility];   Service: Urology;  Laterality: N/A;   TONSILLECTOMY     Patient Active Problem List   Diagnosis Date Noted   Left hip pain 05/21/2024   Acute ST elevation myocardial infarction involving left anterior descending coronary artery (HCC) 05/02/2024   Wellness examination 11/14/2021   Prediabetes 11/14/2021   Dyslipidemia 11/14/2021   History of prostate cancer 08/17/2021   History of tobacco use 08/17/2021    PCP: de [place], Quintin PARAS, MD  REFERRING PROVIDER: de [place], Raymond J, MD  REFERRING DIAG:  Diagnosis  M25.552 (ICD-10-CM) - Left hip pain    THERAPY DIAG:  Pain in left hip  Stiffness of left hip, not elsewhere classified  Cramp and spasm  Rationale for Evaluation and Treatment: Rehabilitation  ONSET DATE: Sept 22  SUBJECTIVE:   SUBJECTIVE STATEMENT: Patient reports he is doing good today. He has been compliant with HEP. Yesterday he was able to walk for 15 mins without any hip pain.   From Eval: Patient presents with left hip pain that started after he was discharged from the hospital in September 2025 . He was told to ease into physical activity in 10 minute increments on flat surfaces. He was walking at the park which was not flat. He feels like his hip tightens up and it makes it difficult to walk. After taking a seated rest break he is able to walk again. This past Monday he started cardiac rehab and with the he was  only able to walk 4 of the minutes due to his hip locking up.  He will be going to cardiac rehab 3x a week. 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 Yes: NPRS scale: 0(currently) 7(worst)/10 Pain location: left glute and radiates down the back of his leg that stops at his knee Pain description: dull; feels like a knot Aggravating factors: walking > 4 mins Relieving factors: sitting and taking pressure off left hip  PRECAUTIONS: Other:  Recent MI 05/04/2024 patient reports no exercise precautions  RED FLAGS: None   WEIGHT BEARING RESTRICTIONS: No  FALLS:  Has patient fallen in last 6 months? 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  DIAGNOSTIC FINDINGS: Ordered 10/8 results not available yet  PATIENT SURVEYS:  LEFS 53/80 66.3%   COGNITION: Overall cognitive status: Within functional limits for tasks assessed     SENSATION: WFL    MUSCLE LENGTH: Hamstrings: decreased bilateral Lt > Rt    POSTURE: No Significant postural limitations  PALPATION: Increased muscle spasms and tenderness to left glutes, piriformis, hamstring insertion   LOWER EXTREMITY ROM: Limited PROM hip IR bilateral ; right hip PROM limited in all directions. Uncomfortable sensation with FABER on Lt    LOWER EXTREMITY FFU:Hmnddob 4+/5 bilateral no left hip pain with testing   FUNCTIONAL TESTS:  5 times sit to stand: 11.37 sec hand on knees; no left hip pain Timed up and go (TUG): 8.14 sec                                                                                                                             TREATMENT DATE:  05/27/2024 Recumbent Bike Level 1  5 mins- PT present to discuss status Standing hamstring stretch at stair 2 x 30 sec bilateral  Seated piriformis  stretch 2 x 30sec bilateral  Standing on 4in step + Lt leg swing (forwards & lateral) 2 x 10 each  Standing hip adductor stretch 2 x 30 sec bilateral  Calf stretch with rockerboard 2 x 30sec bilateral  Standing hip car x 5 each direction bilateral  Supine hip internal/ external rotation x 10 5 each hold Supine glute stretch 2 x 30 sec bilateral  Supine iron cross stretch with green strap 3 x 30 sec bilateral    05/22/2024 Initial Evaluation &  HEP created    PATIENT EDUCATION:  Education details: PT eval findings, anticipated POC, progress with PT, and initial HEP Person educated: Patient Education method: Explanation, Demonstration, and Handouts Education comprehension: verbalized understanding, returned demonstration, and needs further education  HOME EXERCISE PROGRAM: Access Code: [email or web address] URL: [email or web address] Date: 05/27/2024 Prepared by: Kristeen Sar  Exercises - Supine Hip Internal and External Rotation  - 1-2 x daily - 7 x weekly - 1 sets - 10 reps - 5-10s hold - Supine Gluteus Stretch  - 1-2 x daily - 7 x weekly - 2 sets - 20-30sec hold - Seated Hamstring Stretch  - 1 x daily - 7 x weekly - 2 sets - 20-30s hold - Standing Hamstring Stretch on Chair  - 1 x daily - 7 x weekly - 2 sets - 20-30s hold - Seated Figure 4 Piriformis Stretch  - 1 x daily - 7 x weekly - 2 sets - 20-30s hold - Frogger  - 1 x daily - 7 x weekly - 1 sets - 5 reps - Standing Hip External Rotation at Table  - 1 x daily - 7 x weekly - 1 sets - 10 reps  ASSESSMENT:  CLINICAL IMPRESSION: Pike presents to first follow up appointment since evaluation. He verbalized compliance with HEP. He required minimal verbal cues for form correction. Treatment session focused on hip flexibility and mobility. Patient tolerated all exercises well and did not verbalize any pain or discomfort. Yesterday he was able to walk for 15 minutes without any increased left hip pain. Cardiac rehab is going well he  was able to bike for 15 mins. Updated HEP to include exercise progressions. Patient should respond well to skilled therapy. Patient will benefit from skilled PT to address the below impairments and improve overall function.    OBJECTIVE IMPAIRMENTS: decreased activity tolerance, decreased mobility, difficulty walking, decreased ROM, decreased strength, hypomobility, increased muscle spasms, impaired flexibility, impaired tone, and pain.   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   REHAB POTENTIAL: Good  CLINICAL DECISION MAKING: Stable/uncomplicated  EVALUATION COMPLEXITY: Moderate   GOALS: Goals reviewed with patient? Yes  SHORT TERM GOALS: Target date: 06/19/2024 Patient will be independent with initial HEP. Baseline:  Goal status: INITIAL  2.  Patient will report > or = to 30% improvement in left hip stiffness since starting PT. Baseline:  Goal status: INITIAL  3.  Patient will be able to participate in a without his left hip locking up. Baseline:  Goal status: INITIAL   LONG TERM GOALS: Target date: 07/18/2024 Patient will demonstrate independence in advanced HEP. Baseline:  Goal status: INITIAL  2.  Patient will report > or = to 70% improvement in hip stiffness since starting PT. Baseline:  Goal status: INITIAL  3.  Patient will be able to participate in all activities at cardiac rehab with no limitations from his hip. Baseline:  Goal status: INITIAL  4.  Patient will be able to walk for at least 15-20 mins with < or = to 2/10 hip pain to improve cardiovascular endurance. Baseline:  Goal status: INITIAL  5.  Patient will score > or = to 65/80 on LEFS due to improved function and  decreased disability. Baseline: 53/80 Goal status: INITIAL    PLAN:  PT FREQUENCY: 2x/week  PT DURATION: 8 weeks  PLANNED INTERVENTIONS:  02835- PT Re-evaluation, 97110-Therapeutic exercises, 97530- Therapeutic activity, 97112- Neuromuscular re-education, [phone]- Self Care, 02859- Manual therapy, [phone]- Gait training, [phone]- Canalith repositioning, [phone]- Aquatic Therapy, [phone]- Vasopneumatic device, [id number]- Ultrasound, [id number]- Ionotophoresis 4mg /ml Dexamethasone , 79439 (1-2 muscles), [id number] (3+ muscles)- Dry Needling, Patient/Family education, Balance training, Stair training, Taping, Joint mobilization, Joint manipulation, Spinal manipulation, Spinal mobilization, Vestibular training, Cryotherapy, and Moist heat  PLAN FOR NEXT SESSION: assess response to treatment session; hip and lumbar mobility and flexibility; start hip strengthening  Kristeen Sar, PT 05/27/24 4:17 PM [facility] [address] [place], [place] [address] Phone # [phone] Fax [phone]

## 2024-05-28 ENCOUNTER — Other Ambulatory Visit (HOSPITAL_COMMUNITY): Payer: Self-pay

## 2024-05-28 ENCOUNTER — Encounter (HOSPITAL_COMMUNITY)
Admission: RE | Admit: 2024-05-28 | Discharge: 2024-05-28 | Disposition: A | Discharge status: Home / Self Care | Source: Ambulatory Visit | Attending: Cardiology | Admitting: Cardiology

## 2024-05-28 DIAGNOSIS — I213 ST elevation (STEMI) myocardial infarction of unspecified site: Secondary | ICD-10-CM

## 2024-05-28 DIAGNOSIS — Z955 Presence of coronary angioplasty implant and graft: Secondary | ICD-10-CM

## 2024-05-29 ENCOUNTER — Ambulatory Visit

## 2024-05-29 DIAGNOSIS — R252 Cramp and spasm: Secondary | ICD-10-CM

## 2024-05-29 DIAGNOSIS — M25552 Pain in left hip: Secondary | ICD-10-CM | POA: Diagnosis not present

## 2024-05-29 DIAGNOSIS — M25652 Stiffness of left hip, not elsewhere classified: Secondary | ICD-10-CM

## 2024-05-29 NOTE — Therapy (Signed)
 OUTPATIENT PHYSICAL THERAPY LOWER EXTREMITY TREATMENT   Patient Name: Andre Cowan MRN: [id number] DOB:07/07/1956, 68 y.o., male Today's Date: 05/29/2024  END OF SESSION:  PT End of Session - 05/29/24 1016     Visit Number 3    Date for Recertification  07/18/24    Authorization Type BCBS (no auth req)    PT Start Time [phone]    PT Stop Time 1014    PT Time Calculation (min) 43 min    Activity Tolerance Patient tolerated treatment well    Behavior During Therapy [facility] for tasks assessed/performed            Past Medical History:  Diagnosis Date   Cellulitis of neck 12/13/2021   Headache    Migraines   Prostate cancer (HCC)    Skin cancer screening 12/13/2021   Past Surgical History:  Procedure Laterality Date   CERVICAL DISCECTOMY     CORONARY/GRAFT ACUTE MI REVASCULARIZATION N/A 05/02/2024   Procedure: Coronary/Graft Acute MI Revascularization;  Surgeon: Ladona Heinz, MD;  Location: [facility];  Service: Cardiovascular;  Laterality: N/A;   CYST EXCISION  1976   Spine   IABP INSERTION N/A 05/02/2024   Procedure: IABP Insertion;  Surgeon: Ladona Heinz, MD;  Location: [facility];  Service: Cardiovascular;  Laterality: N/A;   LEFT HEART CATH AND CORONARY ANGIOGRAPHY N/A 05/02/2024   Procedure: LEFT HEART CATH AND CORONARY ANGIOGRAPHY;  Surgeon: Ladona Heinz, MD;  Location: [facility];  Service: Cardiovascular;  Laterality: N/A;   LYMPHADENECTOMY Bilateral 04/05/2018   Procedure: LYMPHADENECTOMY;  Surgeon: Alvaro Hummer, MD;  Location: [facility];  Service: Urology;  Laterality: Bilateral;   PROSTATE BIOPSY     RIGHT HEART CATH N/A 05/02/2024   Procedure: RIGHT HEART CATH;  Surgeon: Ladona Heinz, MD;  Location: [facility] INVASIVE CV LAB;  Service: Cardiovascular;  Laterality: N/A;   ROBOT ASSISTED LAPAROSCOPIC RADICAL PROSTATECTOMY N/A 04/05/2018   Procedure: XI ROBOTIC ASSISTED LAPAROSCOPIC RADICAL PROSTATECTOMY;  Surgeon: Alvaro Hummer, MD;  Location: [facility];   Service: Urology;  Laterality: N/A;   TONSILLECTOMY     Patient Active Problem List   Diagnosis Date Noted   Left hip pain 05/21/2024   Acute ST elevation myocardial infarction involving left anterior descending coronary artery (HCC) 05/02/2024   Wellness examination 11/14/2021   Prediabetes 11/14/2021   Dyslipidemia 11/14/2021   History of prostate cancer 08/17/2021   History of tobacco use 08/17/2021    PCP: de [place], Quintin PARAS, MD  REFERRING PROVIDER: de [place], Raymond J, MD  REFERRING DIAG:  Diagnosis  M25.552 (ICD-10-CM) - Left hip pain    THERAPY DIAG:  Pain in left hip  Stiffness of left hip, not elsewhere classified  Cramp and spasm  Rationale for Evaluation and Treatment: Rehabilitation  ONSET DATE: Sept 22  SUBJECTIVE:   SUBJECTIVE STATEMENT: I'm doing good with cardiac rehab.  No Lt hip pain today.   From Eval: Patient presents with left hip pain that started after he was discharged from the hospital in September 2025 . He was told to ease into physical activity in 10 minute increments on flat surfaces. He was walking at the park which was not flat. He feels like his hip tightens up and it makes it difficult to walk. After taking a seated rest break he is able to walk again. This past Monday he started cardiac rehab and with the he was only able to walk 4 of the minutes due to his hip locking  up.  He will be going to cardiac rehab 3x a week. 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 Yes: NPRS scale: 0/10 Pain location: left glute and radiates down the back of his leg that stops at his knee Pain description: dull; feels like a knot Aggravating factors: walking  Relieving factors: sitting and taking pressure off left hip  PRECAUTIONS: Other: Recent MI 05/04/2024 patient reports no exercise precautions  RED FLAGS: None   WEIGHT  BEARING RESTRICTIONS: No  FALLS:  Has patient fallen in last 6 months? 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  DIAGNOSTIC FINDINGS: Ordered 10/8 results not available yet  PATIENT SURVEYS:  LEFS 53/80 66.3%   COGNITION: Overall cognitive status: Within functional limits for tasks assessed     SENSATION: WFL    MUSCLE LENGTH: Hamstrings: decreased bilateral Lt > Rt    POSTURE: No Significant postural limitations  PALPATION: Increased muscle spasms and tenderness to left glutes, piriformis, hamstring insertion   LOWER EXTREMITY ROM: Limited PROM hip IR bilateral ; right hip PROM limited in all directions. Uncomfortable sensation with FABER on Lt    LOWER EXTREMITY FFU:Hmnddob 4+/5 bilateral no left hip pain with testing   FUNCTIONAL TESTS:  5 times sit to stand: 11.37 sec hand on knees; no left hip pain Timed up and go (TUG): 8.14 sec                                                                                                                             TREATMENT DATE:  05/29/2024 NuStep: level 4x 6 minutes-PT monitored throughout Standing hamstring stretch using power plate [id number] seconds  Sit to stand with 5# kettlebell x10, staggered stance 5# x10 each Seated piriformis stretch 2 x 30sec bilateral   Standing on 6in step + Lt leg swing (forwards & lateral) 2 x 10 each  Standing hip abduction and extension with red band 2x10 bil each  Standing hip adductor stretch 2 x 30 sec bilateral-using powerplate  Calf stretch with rockerboard 2 x 30sec bilateral  Supine hip internal/ external rotation x 10 5 each hold Supine glute stretch 2 x 30 sec bilateral  Supine iron cross stretch with green strap 3 x 30 sec bilateral  Sidelying clam with red theraband 2x10 bil   05/27/2024 Recumbent Bike Level  1  5 mins- PT present to discuss status Standing hamstring stretch at stair 2 x 30 sec bilateral  Seated piriformis stretch 2 x 30sec bilateral  Standing on 4in step + Lt leg swing (forwards & lateral) 2 x 10 each  Standing hip adductor stretch 2 x 30 sec bilateral  Calf stretch with rockerboard 2 x 30sec bilateral  Standing hip car x 5 each direction bilateral  Supine hip internal/ external rotation x 10 5 each hold Supine glute stretch 2 x 30 sec bilateral  Supine iron cross stretch with green strap 3 x 30 sec bilateral    05/22/2024 Initial Evaluation & HEP created    PATIENT EDUCATION:  Education details: PT eval findings, anticipated POC, progress with PT, and initial HEP Person educated: Patient Education method: Explanation, Demonstration, and Handouts Education comprehension: verbalized understanding, returned demonstration, and needs further education  HOME EXERCISE PROGRAM: Access Code: [email or web address] URL: [email or web address] Date: 05/27/2024 Prepared by: Kristeen Sar  Exercises - Supine Hip Internal and External Rotation  - 1-2 x daily - 7 x weekly - 1 sets - 10 reps - 5-10s hold - Supine Gluteus Stretch  - 1-2 x daily - 7 x weekly - 2 sets - 20-30sec hold - Seated Hamstring Stretch  - 1 x daily - 7 x weekly - 2 sets - 20-30s hold - Standing Hamstring Stretch on Chair  - 1 x daily - 7 x weekly - 2 sets - 20-30s hold - Seated Figure 4 Piriformis Stretch  - 1 x daily  - 7 x weekly - 2 sets - 20-30s hold - Frogger  - 1 x daily - 7 x weekly - 1 sets - 5 reps - Standing Hip External Rotation at Table  - 1 x daily - 7 x weekly - 1 sets - 10 reps  ASSESSMENT:  CLINICAL IMPRESSION: Pt denies any Lt hip pain x 5 days.  Pt tolerated advancement of exercise in the clinic today without increased pain. He is active with cardiac rehab x 3 days a week and is doing well with this.  PT monitored throughout session for pain, cardiovascular status and technique. Patient will benefit from skilled PT to address the below impairments and improve overall function.    OBJECTIVE IMPAIRMENTS: decreased activity tolerance, decreased mobility, difficulty walking, decreased ROM, decreased strength, hypomobility, increased muscle spasms, impaired flexibility, impaired tone, and pain.   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   REHAB POTENTIAL: Good  CLINICAL DECISION MAKING: Stable/uncomplicated  EVALUATION COMPLEXITY: Moderate   GOALS: Goals reviewed with patient? Yes  SHORT TERM GOALS: Target date: 06/19/2024 Patient will be independent with initial HEP. Baseline:  Goal status: INITIAL  2.  Patient will report > or = to 30% improvement in left hip stiffness since starting PT. Baseline:  Goal status: INITIAL  3.  Patient will be able to participate in a without his left hip locking up. Baseline:  Goal status: INITIAL   LONG TERM GOALS: Target date: 07/18/2024 Patient will demonstrate independence in advanced HEP. Baseline:  Goal status: INITIAL  2.  Patient will report > or = to 70% improvement in hip stiffness since starting PT. Baseline:  Goal status: INITIAL  3.  Patient will  be able to participate in all activities at cardiac rehab with no limitations from his hip. Baseline:  Goal status: INITIAL  4.  Patient will be able to walk for at least 15-20 mins with < or = to 2/10 hip pain to improve cardiovascular endurance. Baseline:  Goal status: INITIAL  5.  Patient will score > or = to 65/80 on LEFS due to improved function and decreased disability. Baseline: 53/80 Goal status: INITIAL    PLAN:  PT FREQUENCY: 2x/week  PT DURATION: 8 weeks  PLANNED INTERVENTIONS: 97164- PT Re-evaluation, 97110-Therapeutic exercises, 97530- Therapeutic activity, 97112- Neuromuscular re-education, 97535- Self Care, 02859- Manual therapy, [phone]- Gait training, [phone]- Canalith repositioning, [id number]- Aquatic Therapy, [phone]- Vasopneumatic device, [id number]- Ultrasound, [id number]- Ionotophoresis 4mg /ml Dexamethasone , 79439 (1-2 muscles), 20561 (3+ muscles)- Dry Needling, Patient/Family education, Balance training, Stair training, Taping, Joint mobilization, Joint manipulation, Spinal manipulation, Spinal mobilization, Vestibular training, Cryotherapy, and Moist heat  PLAN FOR NEXT SESSION: assess response to treatment session; hip and lumbar mobility and flexibility; start hip strengthening.  May place on hold after next session if still no hip pain  Burnard Joy, PT 05/29/24 10:17 AM  [facility] [address], [address] [place], [place] [address] Phone # [phone] Fax [phone]

## 2024-05-30 ENCOUNTER — Encounter (HOSPITAL_COMMUNITY)
Admission: RE | Admit: 2024-05-30 | Discharge: 2024-05-30 | Disposition: A | Source: Ambulatory Visit | Attending: Cardiology

## 2024-05-30 DIAGNOSIS — I213 ST elevation (STEMI) myocardial infarction of unspecified site: Secondary | ICD-10-CM

## 2024-05-30 DIAGNOSIS — Z955 Presence of coronary angioplasty implant and graft: Secondary | ICD-10-CM

## 2024-06-02 ENCOUNTER — Ambulatory Visit (HOSPITAL_BASED_OUTPATIENT_CLINIC_OR_DEPARTMENT_OTHER): Admitting: Family Medicine

## 2024-06-02 ENCOUNTER — Encounter (HOSPITAL_COMMUNITY)
Admission: RE | Admit: 2024-06-02 | Discharge: 2024-06-02 | Disposition: A | Discharge status: Home / Self Care | Source: Ambulatory Visit | Attending: Cardiology | Admitting: Cardiology

## 2024-06-02 DIAGNOSIS — Z955 Presence of coronary angioplasty implant and graft: Secondary | ICD-10-CM

## 2024-06-02 DIAGNOSIS — I213 ST elevation (STEMI) myocardial infarction of unspecified site: Secondary | ICD-10-CM | POA: Diagnosis not present

## 2024-06-03 ENCOUNTER — Ambulatory Visit: Admitting: Physical Therapy

## 2024-06-03 ENCOUNTER — Encounter: Payer: Self-pay | Admitting: Physical Therapy

## 2024-06-03 DIAGNOSIS — M25552 Pain in left hip: Secondary | ICD-10-CM | POA: Diagnosis not present

## 2024-06-03 DIAGNOSIS — M25652 Stiffness of left hip, not elsewhere classified: Secondary | ICD-10-CM

## 2024-06-03 DIAGNOSIS — R252 Cramp and spasm: Secondary | ICD-10-CM | POA: Diagnosis not present

## 2024-06-03 NOTE — Therapy (Addendum)
 OUTPATIENT PHYSICAL THERAPY LOWER EXTREMITY TREATMENT/ DISCHARGE NOTE   Patient Name: Andre Cowan MRN: [id number] DOB:1956-06-10, 68 y.o., male Today's Date: 06/03/2024  END OF SESSION:  PT End of Session - 06/03/24 0842     Visit Number 4    Date for Recertification  07/18/24    Authorization Type BCBS (no auth req)    PT Start Time 0800    PT Stop Time 0842    PT Time Calculation (min) 42 min    Activity Tolerance Patient tolerated treatment well    Behavior During Therapy [facility] for tasks assessed/performed             Past Medical History:  Diagnosis Date   Cellulitis of neck 12/13/2021   Headache    Migraines   Prostate cancer (HCC)    Skin cancer screening 12/13/2021   Past Surgical History:  Procedure Laterality Date   CERVICAL DISCECTOMY     CORONARY/GRAFT ACUTE MI REVASCULARIZATION N/A 05/02/2024   Procedure: Coronary/Graft Acute MI Revascularization;  Surgeon: Ladona Heinz, MD;  Location: [facility] INVASIVE CV LAB;  Service: Cardiovascular;  Laterality: N/A;   CYST EXCISION  1976   Spine   IABP INSERTION N/A 05/02/2024   Procedure: IABP Insertion;  Surgeon: Ladona Heinz, MD;  Location: [facility] INVASIVE CV [facility];  Service: Cardiovascular;  Laterality: N/A;   LEFT HEART CATH AND CORONARY ANGIOGRAPHY N/A 05/02/2024   Procedure: LEFT HEART CATH AND CORONARY ANGIOGRAPHY;  Surgeon: Ladona Heinz, MD;  Location: [facility] INVASIVE CV LAB;  Service: Cardiovascular;  Laterality: N/A;   LYMPHADENECTOMY Bilateral 04/05/2018   Procedure: LYMPHADENECTOMY;  Surgeon: Alvaro Hummer, MD;  Location: [facility] OR[facility];  Service: Urology;  Laterality: Bilateral;   PROSTATE BIOPSY     RIGHT HEART CATH N/A 05/02/2024   Procedure: RIGHT HEART CATH;  Surgeon: Ladona Heinz, MD;  Location: [facility] INVASIVE CV LAB;  Service: Cardiovascular;  Laterality: N/A;   ROBOT ASSISTED LAPAROSCOPIC RADICAL PROSTATECTOMY N/A 04/05/2018   Procedure: XI ROBOTIC ASSISTED LAPAROSCOPIC RADICAL PROSTATECTOMY;  Surgeon: Alvaro Hummer, MD;   Location: [facility] OR[facility];  Service: Urology;  Laterality: N/A;   TONSILLECTOMY     Patient Active Problem List   Diagnosis Date Noted   Left hip pain 05/21/2024   Acute ST elevation myocardial infarction involving left anterior descending coronary artery (HCC) 05/02/2024   Wellness examination 11/14/2021   Prediabetes 11/14/2021   Dyslipidemia 11/14/2021   History of prostate cancer 08/17/2021   History of tobacco use 08/17/2021    PCP: de [place], Quintin PARAS, MD  REFERRING PROVIDER: de [place], Raymond J, MD  REFERRING DIAG:  Diagnosis  M25.552 (ICD-10-CM) - Left hip pain    THERAPY DIAG:  Pain in left hip  Stiffness of left hip, not elsewhere classified  Cramp and spasm  Rationale for Evaluation and Treatment: Rehabilitation  ONSET DATE: Sept 22  SUBJECTIVE:   SUBJECTIVE STATEMENT: Patient reports he is doing good today. He was out running errands yesterday and he did not have any issues from his hip.  From Eval: Patient presents with left hip pain that started after he was discharged from the hospital in September 2025 . He was told to ease into physical activity in 10 minute increments on flat surfaces. He was walking at the park which was not flat. He feels like his hip tightens up and it makes it difficult to walk. After taking a seated rest break he is able to walk again. This past Monday he started cardiac rehab and with the he was  only able to walk 4 of the minutes due to his hip locking up.  He will be going to cardiac rehab 3x a week. Patient reports no exercise restrictions PERTINENT HISTORY: Hx prostate cancer; pre-diabetes; myocadial infarction 05/02/2024  PAIN: 06/03/24 Are you having pain? Yes: NPRS scale: 0/10 Pain location: left glute and radiates down the back of his leg that stops at his knee Pain description: dull; feels like a knot Aggravating factors: walking  Relieving factors: sitting and taking pressure off left hip  PRECAUTIONS: Other: Recent MI  05/04/2024 patient reports no exercise precautions  RED FLAGS: None   WEIGHT BEARING RESTRICTIONS: No  FALLS:  Has patient fallen in last 6 months? 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  DIAGNOSTIC FINDINGS: Ordered 10/8 results not available yet  PATIENT SURVEYS:  LEFS 53/80 66.3%   06/03/2024 : 71/80 88.8%  COGNITION: Overall cognitive status: Within functional limits for tasks assessed     SENSATION: WFL    MUSCLE LENGTH: Hamstrings: decreased bilateral Lt > Rt    POSTURE: No Significant postural limitations  PALPATION: Increased muscle spasms and tenderness to left glutes, piriformis, hamstring insertion   LOWER EXTREMITY ROM: Limited PROM hip IR bilateral ; right hip PROM limited in all directions. Uncomfortable sensation with FABER on Lt    LOWER EXTREMITY FFU:Hmnddob 4+/5 bilateral no left hip pain with testing   FUNCTIONAL TESTS:  5 times sit to stand: 11.37 sec hand on knees; no left hip pain Timed up and go (TUG): 8.14 sec                                                                                                                             TREATMENT DATE:  06/03/2024 Recumbent Bike Level 2- 5 mins PT monitored throughout LEFS 71/80 88.8% Standing hamstring stretch using power plate [id number]  seconds  Seated piriformis stretch 2 x 30sec bilateral  Sit to stand staggered stance 10# x10 each Lateral lunge holding 10# DB x 10 bilateral  Standing hip abduction and extension with yellow loop 2x10 bil each  Sidelying clam with red theraband 2x10 bil Supine bridge 2 x 10 Supine glute stretch 2 x 30 sec bilateral  Supine iron cross stretch with green strap 3 x 30 sec bilateral      05/29/2024 NuStep: level 4x 6 minutes-PT monitored throughout Standing hamstring stretch using power plate  3x20 seconds  Sit to stand with 5# kettlebell x10, staggered stance 5# x10 each Seated piriformis stretch 2 x 30sec bilateral  Standing on 6in step + Lt leg swing (forwards & lateral) 2 x 10 each  Standing hip abduction and extension with red band 2x10 bil each  Standing hip adductor stretch 2 x 30 sec bilateral-using powerplate  Calf stretch with rockerboard 2 x 30sec bilateral  Supine hip internal/ external rotation x 10 5 each hold Supine glute stretch 2 x 30 sec bilateral  Supine iron cross stretch with green strap 3 x 30 sec bilateral  Sidelying clam with red theraband 2x10 bil   05/27/2024 Recumbent Bike Level 1  5 mins- PT present to discuss status Standing hamstring stretch at stair 2 x 30 sec bilateral  Seated piriformis stretch 2 x 30sec bilateral  Standing on 4in step + Lt leg swing (forwards & lateral) 2 x 10 each  Standing hip adductor stretch 2 x 30 sec bilateral  Calf stretch with rockerboard 2 x 30sec bilateral  Standing hip car x 5 each direction bilateral  Supine hip internal/ external rotation x 10 5 each hold Supine glute stretch 2 x 30 sec bilateral  Supine iron cross stretch with green strap 3 x 30 sec bilateral    05/22/2024 Initial Evaluation & HEP created    PATIENT EDUCATION:  Education details: PT eval findings, anticipated POC, progress with PT, and initial HEP Person educated: Patient Education method: Explanation, Demonstration, and Handouts Education  comprehension: verbalized understanding, returned demonstration, and needs further education  HOME EXERCISE PROGRAM: Access Code: [email or web address] URL: [email or web address] Date: 05/27/2024 Prepared by: Kristeen Sar  Exercises - Supine Hip Internal and External Rotation  - 1-2 x daily - 7 x weekly - 1 sets - 10 reps - 5-10s hold - Supine Gluteus Stretch  - 1-2 x daily - 7 x weekly - 2 sets - 20-30sec hold - Seated Hamstring Stretch  - 1 x daily - 7 x weekly - 2 sets - 20-30s hold - Standing Hamstring Stretch on Chair  - 1 x daily - 7 x weekly - 2 sets - 20-30s hold - Seated Figure 4 Piriformis Stretch  - 1 x daily - 7 x weekly - 2 sets - 20-30s hold - Frogger  - 1 x daily - 7 x weekly - 1 sets - 5 reps - Standing Hip External Rotation at Table  - 1 x daily - 7 x weekly - 1 sets - 10 reps  ASSESSMENT:  CLINICAL IMPRESSION: Tiant verbalized feeling 100% better since starting skilled therapy. He has been compliant with HEP and his hip is not limiting him from any functional activities. He has been able to run errands for 3+ hours without any complaints of his left hip locking up. He tolerated hip strengthening exercises well. PT monitored patient throughout session to ensure proper response. Plan to place patient on a 30 day hold.    OBJECTIVE IMPAIRMENTS: decreased activity tolerance, decreased mobility, difficulty walking, decreased ROM, decreased strength, hypomobility, increased muscle spasms, impaired flexibility, impaired tone, and pain.   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   REHAB POTENTIAL: Good  CLINICAL DECISION MAKING: Stable/uncomplicated  EVALUATION COMPLEXITY:  Moderate   GOALS: Goals reviewed with patient? Yes  SHORT TERM GOALS: Target date: 06/19/2024 Patient will be independent  with initial HEP. Baseline:  Goal status: MET 06/03/2024  2.  Patient will report > or = to 30% improvement in left hip stiffness since starting PT. Baseline:  Goal status: MET (100%) 06/03/2024  3.  Patient will be able to participate in a without his left hip locking up. Baseline:  Goal status: INITIAL   LONG TERM GOALS: Target date: 07/18/2024 Patient will demonstrate independence in advanced HEP. Baseline:  Goal status: INITIAL  2.  Patient will report > or = to 70% improvement in hip stiffness since starting PT. Baseline:  Goal status: MET (100%) 06/03/2024  3.  Patient will be able to participate in all activities at cardiac rehab with no limitations from his hip. Baseline:  Goal status: INITIAL  4.  Patient will be able to walk for at least 15-20 mins with < or = to 2/10 hip pain to improve cardiovascular endurance. Baseline:  Goal status: MET 06/03/2024  5.  Patient will score > or = to 65/80 on LEFS due to improved function and decreased disability. Baseline: 53/80 Goal status:MET 06/03/2024    PLAN:  PT FREQUENCY: 2x/week  PT DURATION: 8 weeks  PLANNED INTERVENTIONS: 97164- PT Re-evaluation, 97110-Therapeutic exercises, 97530- Therapeutic activity, 97112- Neuromuscular re-education, 97535- Self Care, 02859- Manual therapy, [phone]- Gait training, [phone]- Canalith repositioning, [id number]- Aquatic Therapy, [phone]- Vasopneumatic device, [id number]- Ultrasound, [id number]- Ionotophoresis 4mg /ml Dexamethasone , 79439 (1-2 muscles), 20561 (3+ muscles)- Dry Needling, Patient/Family education, Balance training, Stair training, Taping, Joint mobilization, Joint manipulation, Spinal manipulation, Spinal mobilization, Vestibular training, Cryotherapy, and Moist heat  PLAN FOR NEXT SESSION: plan to place patient on a 30 day hold  Kristeen Sar, PT, DPT 06/03/24  8:43 AM [facility] [address] [place], [place] [address] Phone # [phone] Fax [phone]   PHYSICAL THERAPY DISCHARGE SUMMARY  Visits from Start of Care: 4   Patient agrees to discharge. Patient goals were met. Patient is being discharged due to not returning since the last visit.  Kristeen Sar, PT, DPT 07/30/2024 9:10 AM

## 2024-06-04 ENCOUNTER — Encounter (HOSPITAL_COMMUNITY)
Admission: RE | Admit: 2024-06-04 | Discharge: 2024-06-04 | Disposition: A | Source: Ambulatory Visit | Attending: Cardiology

## 2024-06-04 DIAGNOSIS — I213 ST elevation (STEMI) myocardial infarction of unspecified site: Secondary | ICD-10-CM | POA: Diagnosis not present

## 2024-06-04 DIAGNOSIS — Z955 Presence of coronary angioplasty implant and graft: Secondary | ICD-10-CM

## 2024-06-05 ENCOUNTER — Ambulatory Visit (HOSPITAL_COMMUNITY)
Admission: RE | Admit: 2024-06-05 | Discharge: 2024-06-05 | Disposition: A | Discharge status: Home / Self Care | Source: Ambulatory Visit | Attending: Cardiology | Admitting: Cardiology

## 2024-06-05 ENCOUNTER — Encounter

## 2024-06-05 VITALS — BP 94/62 | HR 96 | Wt 202.0 lb

## 2024-06-05 DIAGNOSIS — Z7982 Long term (current) use of aspirin: Secondary | ICD-10-CM | POA: Insufficient documentation

## 2024-06-05 DIAGNOSIS — Z7984 Long term (current) use of oral hypoglycemic drugs: Secondary | ICD-10-CM | POA: Diagnosis not present

## 2024-06-05 DIAGNOSIS — Z951 Presence of aortocoronary bypass graft: Secondary | ICD-10-CM | POA: Insufficient documentation

## 2024-06-05 DIAGNOSIS — I2510 Atherosclerotic heart disease of native coronary artery without angina pectoris: Secondary | ICD-10-CM | POA: Diagnosis not present

## 2024-06-05 DIAGNOSIS — Z79818 Long term (current) use of other agents affecting estrogen receptors and estrogen levels: Secondary | ICD-10-CM | POA: Insufficient documentation

## 2024-06-05 DIAGNOSIS — I255 Ischemic cardiomyopathy: Secondary | ICD-10-CM | POA: Insufficient documentation

## 2024-06-05 DIAGNOSIS — E785 Hyperlipidemia, unspecified: Secondary | ICD-10-CM | POA: Insufficient documentation

## 2024-06-05 DIAGNOSIS — Z8546 Personal history of malignant neoplasm of prostate: Secondary | ICD-10-CM | POA: Insufficient documentation

## 2024-06-05 DIAGNOSIS — R7303 Prediabetes: Secondary | ICD-10-CM | POA: Diagnosis not present

## 2024-06-05 DIAGNOSIS — F17200 Nicotine dependence, unspecified, uncomplicated: Secondary | ICD-10-CM | POA: Insufficient documentation

## 2024-06-05 DIAGNOSIS — Z79899 Other long term (current) drug therapy: Secondary | ICD-10-CM | POA: Diagnosis not present

## 2024-06-05 DIAGNOSIS — I252 Old myocardial infarction: Secondary | ICD-10-CM | POA: Diagnosis not present

## 2024-06-05 DIAGNOSIS — I5022 Chronic systolic (congestive) heart failure: Secondary | ICD-10-CM | POA: Insufficient documentation

## 2024-06-05 MED ORDER — METOPROLOL SUCCINATE ER 25 MG PO TB24: 12.5000 mg | ORAL_TABLET | Freq: Every day | ORAL | 1 refills | Status: AC

## 2024-06-05 NOTE — Progress Notes (Signed)
 Advanced Heart Failure Clinic Note  PCP: [place], Quintin PARAS, MD PCP-Cardiologist: None HF-Cardiologist: Ria Commander, DO  HPI:  Andre Cowan is a 68 y.o. male with history of prostate cancer s/p prostatectomy, tobacco use, HLD and prediabetes.    He was admitted 04/2024 with anterior STEMI. RHC findings significant for and elevated biventricular filling pressures and reduced cardiac index. Bedside echo showed hypokinesis to akinesis of the LV apex and LAD territory with some concern for possible LAD thrombus. Decision made to proceed with IABP support. IABP and pressor support weaned off. GDMT limited with soft BP. Plan to add further GDMT OP.  Discharge echo with no definite clot seen and LVEF of 25%, decision made to stop Eliquis  (as it interacted with Xtandi) and keep on ASA and Effient .  Interval History: Seen for post-hospital follow-up on 05/12/24. Mildly hypervolemic and Jardiance 10 mg daily was added.   Today Andre Cowan returns to Heart Failure Clinic for pharmacist medication titration. Reports feeling great overall. Reports mild orthostasis ~5 times weekly. Symptoms are vastly improved. Occasional shortness of breath with moderate exertion. Denies fatigue orthopnea orthostasis PND. Does report occasional mild chest pain, though not similar to his STEMI and not new since last visit. Reports being able to complete all activities of daily living (ADLs). Is not very active throughout the day. Weight at home is ~193 pounds. Appetite has been good. Does follow a low sodium diet. BP at home is generally 95-110/60s.  Current Heart Failure Medications: Loop diuretic: none Beta-Blocker: none ACEI/ARB/ARNI: none MRA: spironolactone  12.5 mg daily SGLT2i: Jardiance 10 mg daily Other: none  Has the patient been experiencing any side effects to the medications prescribed? No  Does the patient have any problems obtaining medications due to transportation or finances?  No  Understanding of regimen: Good  Understanding of indications: Good  Potential of adherence: Good  Patient understands to avoid NSAIDs.  Patient understands to avoid decongestants.  Pertinent Lab Values: Creatinine, Ser  Date Value Ref Range Status  05/12/2024 0.94 0.61 - 1.24 mg/dL Final   BUN  Date Value Ref Range Status  05/12/2024 15 8 - 23 mg/dL Final  96/72/7974 12 8 - 27 mg/dL Final   Potassium  Date Value Ref Range Status  05/12/2024 4.2 3.5 - 5.1 mmol/L Final   Sodium  Date Value Ref Range Status  05/12/2024 133 (L) 135 - 145 mmol/L Final  11/08/2023 139 134 - 144 mmol/L Final   B Natriuretic Peptide  Date Value Ref Range Status  05/12/2024 597.5 (H) 0.0 - 100.0 pg/mL Final    Comment:    Performed at [facility], [address], [place], [place] [address]   Magnesium   Date Value Ref Range Status  05/03/2024 2.6 (H) 1.7 - 2.4 mg/dL Final    Comment:    Performed at [facility], [address], [place], [place] [address]   TSH  Date Value Ref Range Status  11/08/2023 2.530 0.450 - 4.500 uIU/mL Final    Vital Signs: There were no vitals filed for this visit.  Assessment/Plan: - iCM - Echo 05/02/24: EF 25%, LV with RWMA, RV systolic function normal. Small pericardial effusion present.  - repeat echo 05/05/2024: EF 25%.  No thrombus seen but is at risk with sludging of flow at the apex. - GDMT initially limited with soft BP.  - Appears euvolemic today. Orthopnea and cough resolved and weight is down 6 lbs.  - Continue spironolactone  12.5 mg at bedtime -  Continue Jardiance 10 mg daily. Only had a UTI one time after prostate surgery, none since. A1c 5.7 04/2024. Repeat BMET today. - Start metoprolol succinate 12.5 mg daily. History of STEMI. BP on softer side. Cardiogenic shock last month during STEMI, now asymptomatic and euvolemic. Discussed with MD and will start low dose BB for CHF and MI.  - Repeat echo in ~2 months per APP  - Going  to cardiac rehab    Recent anterior STEMI / CAD - LHC occluded p LAD treated with PCI/DES. Residual disease in RCA and LCX - S/P PCI to the LAD on by Dr. Ladona on 05/02/24.  - Continue DAPT with ASA and effient  + high intensity statin - Early thrombus formation on TTE. Not visualized on echo at discharge.  - Denies any chest pain.    Hyperlipidemia -continue Lipitor  80mg  daily.    Prostate cancer  -S/p prostatectomy -On Xtandi   Tobacco use -Cessation advised.  Follow up: APP clinic on 06/09/24  Please do not hesitate to reach out with questions or concerns,  Jaun Bash, PharmD, CPP, BCPS, [facility] Heart Failure Pharmacist  Phone - [phone] 06/05/2024 2:57 PM

## 2024-06-05 NOTE — Patient Instructions (Signed)
 It was a pleasure seeing you today!  MEDICATIONS: -Start metoprolol succinate 12.5 mg (1/2 tablet) once daily -Call if you have questions about your medications.  LABS: -We will call you if your labs need attention.  NEXT APPOINTMENT: Return to clinic 06/09/24.  In general, to take care of your heart failure: -Limit your fluid intake to 2 Liters (half-gallon) per day.   -Limit your salt intake to ideally 2-3 grams (2000-3000 mg) per day. -Weigh yourself daily and record, and bring that weight diary to your next appointment.  (Weight gain of 2-3 pounds in 1 day typically means fluid weight.) -The medications for your heart are to help your heart and help you live longer.   -Please contact us  before stopping any of your heart medications.  Call the clinic at [phone] with questions or to reschedule future appointments.

## 2024-06-06 ENCOUNTER — Telehealth (HOSPITAL_COMMUNITY): Payer: Self-pay

## 2024-06-06 ENCOUNTER — Encounter (HOSPITAL_COMMUNITY)
Admission: RE | Admit: 2024-06-06 | Discharge: 2024-06-06 | Disposition: A | Discharge status: Home / Self Care | Source: Ambulatory Visit | Attending: Cardiology | Admitting: Cardiology

## 2024-06-06 DIAGNOSIS — Z955 Presence of coronary angioplasty implant and graft: Secondary | ICD-10-CM

## 2024-06-06 DIAGNOSIS — I213 ST elevation (STEMI) myocardial infarction of unspecified site: Secondary | ICD-10-CM

## 2024-06-06 NOTE — Telephone Encounter (Signed)
 Called to confirm/remind patient of their appointment at the Advanced Heart Failure Clinic on 06/09/24.   Appointment:   [x] Confirmed  [] Left mess   [] No answer/No voice mail  [] VM Full/unable to leave message  [] Phone not in service  Patient reminded to bring all medications and/or complete list.  Confirmed patient has transportation. Gave directions, instructed to utilize valet parking.

## 2024-06-06 NOTE — Progress Notes (Signed)
 ADVANCED HF CLINIC NOTE  Primary Care: [place], Quintin PARAS, MD Primary Cardiologist: Dr. Ladona HF Cardiologist: Dr. Zenaida  HPI: Andre Cowan is a 68 y.o. male with history of prostate cancer s/p prostatectomy, tobacco use, HLD prediabetes, and newly diagnosed CAD and HFrEF.    Admitted 9/25 with anterior STEMI. RHC findings significant for and elevated biventricular filling pressures and reduced cardiac index. Bedside echo showed hypokinesis to akinesis of the LV apex and LAD territory with some concern for possible LAD thrombus. Decision made to proceed with IABP support. IABP and pressor support weaned off. GDMT limited with soft BP. Plan to add further GDMT OP.  Discharge echo with no definite clot seen, decision made to stop Eliquis  (as it interacted with Xtandi) and keep on ASA and Effient .  Today he returns for HF follow up. Overall feeling fine. He has SOB after showering and walking for exercise. Has a cough and chest hollowness, worse with inhalation. This is occasional, feels it when he lays down. Denies palpitations, abnormal bleeding, dizziness, edema, or PND/Orthopnea. Appetite ok. Weight at home 193 pounds. Taking all medications. Quit smoking before admission, no tobacco or ETOH use. Works as scientist, physiological. Sleeps with 1 pillow.    Past Medical History:  Diagnosis Date   Cellulitis of neck 12/13/2021   Headache    Migraines   Prostate cancer (HCC)    Skin cancer screening 12/13/2021   Current Outpatient Medications  Medication Sig Dispense Refill   aspirin  81 MG chewable tablet Chew 1 tablet (81 mg total) by mouth daily. 30 tablet 5   atorvastatin  (LIPITOR ) 80 MG tablet Take 1 tablet (80 mg total) by mouth daily. 30 tablet 5   empagliflozin (JARDIANCE) 10 MG TABS tablet Take 1 tablet (10 mg total) by mouth daily before breakfast. 30 tablet 5   metoprolol succinate (TOPROL XL) 25 MG 24 hr tablet Take 0.5 tablets (12.5 mg total) by mouth daily. 45 tablet 1    pantoprazole  (PROTONIX ) 40 MG tablet Take 1 tablet (40 mg total) by mouth daily. 30 tablet 5   prasugrel  (EFFIENT ) 10 MG TABS tablet Take 1 tablet (10 mg total) by mouth daily. 30 tablet 5   spironolactone  (ALDACTONE ) 25 MG tablet Take 0.5 tablets (12.5 mg total) by mouth at bedtime. 30 tablet 5   XTANDI 40 MG capsule Take 80 mg by mouth every evening.     No current facility-administered medications for this encounter.   No Known Allergies  Social History   Socioeconomic History   Marital status: Married    Spouse name: Not on file   Number of children: 3   Years of education: Not on file   Highest education level: Bachelor's degree (e.g., BA, AB, BS)  Occupational History    Comment: working full time   Tobacco Use   Smoking status: Some Days    Current packs/day: 0.00    Average packs/day: 0.5 packs/day for 40.0 years (20.0 ttl pk-yrs)    Types: Cigarettes    Start date: 09/15/1975    Last attempt to quit: 09/15/2015    Years since quitting: 8.7    Passive exposure: Current   Smokeless tobacco: Never  Vaping Use   Vaping status: Never Used  Substance and Sexual Activity   Alcohol use: Never   Drug use: Never   Sexual activity: Not Currently  Other Topics Concern   Not on file  Social History Narrative   Not on file   Social Drivers of Health  Financial Resource Strain: Low Risk  (05/21/2024)   Overall Financial Resource Strain (CARDIA)    Difficulty of Paying Living Expenses: Not very hard  Food Insecurity: No Food Insecurity (05/21/2024)   Hunger Vital Sign    Worried About Running Out of Food in the Last Year: Never true    Ran Out of Food in the Last Year: Never true  Transportation Needs: No Transportation Needs (05/21/2024)   PRAPARE - Administrator, Civil Service (Medical): No    Lack of Transportation (Non-Medical): No  Physical Activity: Insufficiently Active (05/21/2024)   Exercise Vital Sign    Days of Exercise per Week: 2 days    Minutes of  Exercise per Session: 10 min  Stress: No Stress Concern Present (05/21/2024)   [employer] of Occupational Health - Occupational Stress Questionnaire    Feeling of Stress: Not at all  Social Connections: Moderately Isolated (05/21/2024)   Social Connection and Isolation Panel    Frequency of Communication with Friends and Family: More than three times a week    Frequency of Social Gatherings with Friends and Family: More than three times a week    Attends Religious Services: Patient declined    Database Administrator or Organizations: No    Attends Engineer, Structural: Not on file    Marital Status: Married  Catering Manager Violence: Not At Risk (05/02/2024)   Humiliation, Afraid, Rape, and Kick questionnaire    Fear of Current or Ex-Partner: No    Emotionally Abused: No    Physically Abused: No    Sexually Abused: No   Family History  Problem Relation Age of Onset   Hypertension Father    Heart disease Father    Heart attack Father    Prostate cancer Neg Hx    Breast cancer Neg Hx    Colon cancer Neg Hx    Pancreatic cancer Neg Hx     Wt Readings from Last 3 Encounters:  06/09/24 91.8 kg (202 lb 6.4 oz)  06/05/24 91.6 kg (202 lb)  05/21/24 90.7 kg (200 lb)   BP 100/64   Pulse 88   Wt 91.8 kg (202 lb 6.4 oz)   SpO2 98%   BMI 29.04 kg/m   PHYSICAL EXAM: General:  NAD. No resp difficulty, walked into clinic HEENT: Normal Neck: Supple. No JVD. Cor: Regular rate & rhythm. No rubs, gallops or murmurs. Lungs: Diminished  Abdomen: Soft, nontender, nondistended.  Extremities: No cyanosis, clubbing, rash, edema Neuro: Alert & oriented x 3, moves all 4 extremities w/o difficulty. Affect pleasant.  ASSESSMENT & PLAN: Chronic HFrEF - iCM - Echo 05/02/24: EF 25%, LV with RWMA, RV normal. Small pericardial effusion present.  - Repeat echo 05/05/2024: EF 25%.  No thrombus seen but is at risk with sludging of flow at the apex. - NYHA II, volume OK. GDMT limited by  BP - Continue spiro 12.5 mg at bedtime. - Continue Toprol XL 12.5 mg daily. - Continue Jardiance 10 mg daily. - No BP room for ARB/ARNi - Repeat echo next visit - Labs today  CAD - LHC occluded p LAD treated with PCI/DES. Residual disease in RCA and LCX - S/P PCI to the LAD on by Dr. Ladona on 05/02/24.  - No chest pain - Continue DAPT with ASA and effient  + high intensity statin - Early thrombus formation on TTE. Not visualized on echo at discharge.  - Continue CR   Hyperlipidemia - Continue lipitor  80 mg daily.  -  management per Gen Cards   Prostate cancer  - S/p prostatectomy - On Xtandi   Tobacco use - Remains quit since admission - Congratulated.  Follow up in 2 months with Dr. Zenaida + echo.   Given a note for employer today to remain out of work until follow up with Dr. Zenaida.  Harlene HERO [facility] FNP-BC  06/09/24

## 2024-06-09 ENCOUNTER — Encounter (HOSPITAL_COMMUNITY): Payer: Self-pay

## 2024-06-09 ENCOUNTER — Encounter (HOSPITAL_COMMUNITY)

## 2024-06-09 ENCOUNTER — Ambulatory Visit (HOSPITAL_COMMUNITY): Payer: Self-pay | Admitting: Family Medicine

## 2024-06-09 ENCOUNTER — Ambulatory Visit (HOSPITAL_COMMUNITY)
Admission: RE | Admit: 2024-06-09 | Discharge: 2024-06-09 | Disposition: A | Discharge status: Home / Self Care | Source: Ambulatory Visit | Attending: Family Medicine | Admitting: Family Medicine

## 2024-06-09 VITALS — BP 100/64 | HR 88 | Wt 202.4 lb

## 2024-06-09 DIAGNOSIS — E785 Hyperlipidemia, unspecified: Secondary | ICD-10-CM | POA: Insufficient documentation

## 2024-06-09 DIAGNOSIS — Z7982 Long term (current) use of aspirin: Secondary | ICD-10-CM | POA: Diagnosis not present

## 2024-06-09 DIAGNOSIS — Z955 Presence of coronary angioplasty implant and graft: Secondary | ICD-10-CM | POA: Diagnosis not present

## 2024-06-09 DIAGNOSIS — Z9079 Acquired absence of other genital organ(s): Secondary | ICD-10-CM | POA: Diagnosis not present

## 2024-06-09 DIAGNOSIS — I2510 Atherosclerotic heart disease of native coronary artery without angina pectoris: Secondary | ICD-10-CM | POA: Insufficient documentation

## 2024-06-09 DIAGNOSIS — I3139 Other pericardial effusion (noninflammatory): Secondary | ICD-10-CM | POA: Insufficient documentation

## 2024-06-09 DIAGNOSIS — Z79899 Other long term (current) drug therapy: Secondary | ICD-10-CM | POA: Diagnosis not present

## 2024-06-09 DIAGNOSIS — R0602 Shortness of breath: Secondary | ICD-10-CM | POA: Diagnosis not present

## 2024-06-09 DIAGNOSIS — I5022 Chronic systolic (congestive) heart failure: Secondary | ICD-10-CM | POA: Diagnosis not present

## 2024-06-09 DIAGNOSIS — C61 Malignant neoplasm of prostate: Secondary | ICD-10-CM | POA: Diagnosis not present

## 2024-06-09 DIAGNOSIS — Z7984 Long term (current) use of oral hypoglycemic drugs: Secondary | ICD-10-CM | POA: Insufficient documentation

## 2024-06-09 DIAGNOSIS — I252 Old myocardial infarction: Secondary | ICD-10-CM | POA: Insufficient documentation

## 2024-06-09 DIAGNOSIS — F17210 Nicotine dependence, cigarettes, uncomplicated: Secondary | ICD-10-CM | POA: Diagnosis not present

## 2024-06-09 DIAGNOSIS — Z720 Tobacco use: Secondary | ICD-10-CM

## 2024-06-09 DIAGNOSIS — Z7902 Long term (current) use of antithrombotics/antiplatelets: Secondary | ICD-10-CM | POA: Insufficient documentation

## 2024-06-09 LAB — BASIC METABOLIC PANEL WITH GFR
Anion gap: 13 (ref 5–15)
BUN: 23 mg/dL (ref 8–23)
CO2: 22 mmol/L (ref 22–32)
Calcium: 9.3 mg/dL (ref 8.9–10.3)
Chloride: 104 mmol/L (ref 98–111)
Creatinine, Ser: 1.02 mg/dL (ref 0.61–1.24)
GFR, Estimated: >60 mL/min (ref >60)
Glucose, Bld: 89 mg/dL (ref 70–99)
Potassium: 4 mmol/L (ref 3.5–5.1)
Sodium: 139 mmol/L (ref 135–145)

## 2024-06-09 LAB — BRAIN NATRIURETIC PEPTIDE: B Natriuretic Peptide: 301.3 pg/mL — ABNORMAL HIGH (ref 0.0–100.0)

## 2024-06-09 NOTE — Patient Instructions (Addendum)
 Thank you for coming in today  If you had labs drawn today, any labs that are abnormal the clinic will call you No news is good news  Dr. Ladona office [phone]  Medications: no changes  Follow up appointments:  Your physician recommends that you schedule a follow-up appointment in:  Echocardiogram 07/16/2024 Dr. Zenaida 07/23/2024   Do the following things EVERYDAY: Weigh yourself in the morning before breakfast. Write it down and keep it in a log. Take your medicines as prescribed Eat low salt foods--Limit salt (sodium) to 2000 mg per day.  Stay as active as you can everyday Limit all fluids for the day to less than 2 liters   At the [facility], you and your health needs are our priority. As part of our continuing mission to provide you with exceptional heart care, we have created designated Provider Care Teams. These Care Teams include your primary Cardiologist (physician) and Advanced Practice Providers (APPs- Physician Assistants and Nurse Practitioners) who all work together to provide you with the care you need, when you need it.   You may see any of the following providers on your designated Care Team at your next follow up: Dr Toribio Fuel Dr Ezra Shuck Dr. Ria Gardenia Greig Lenetta, NP Caffie Shed, [place] [facility] [place], [place] Beckey Coe, NP Tinnie Redman, PharmD   Please be sure to bring in all your medications bottles to every appointment.    Thank you for choosing [facility]  If you have any questions or concerns before your next appointment please send us  a message through [facility] or call our office at [phone].    TO LEAVE A MESSAGE FOR THE NURSE SELECT OPTION 2, PLEASE LEAVE A MESSAGE INCLUDING: YOUR NAME DATE OF BIRTH CALL BACK NUMBER REASON FOR CALL**this is important as we prioritize the call backs  YOU WILL RECEIVE A CALL BACK THE SAME DAY AS LONG AS YOU CALL  BEFORE 4:00 PM

## 2024-06-10 ENCOUNTER — Telehealth: Payer: Self-pay | Admitting: Cardiology

## 2024-06-10 ENCOUNTER — Telehealth (HOSPITAL_COMMUNITY): Payer: Self-pay

## 2024-06-10 NOTE — Telephone Encounter (Signed)
 [facility] called to f/u and stating they are sending a fax. Please advise

## 2024-06-10 NOTE — Telephone Encounter (Signed)
 Patient called stating he had disability paperwork being sent to us  and that we needed to send his medical records to [facility]. Received fax addressed to Dr. Ladona requesting office notes and a form to be filled out and signed by the provider.  Informed patient we do not have a provider on property and would only be able to fax certain progress notes. Attempted to call claims examiner listed on form- no answer, left message. Faxed cardiac rehab progress report to fax indicated on form. Called [facility], let them know we were forwarding the disability form to Dr. Godfrey office and that the deadline listed on the form is 11/03. Faxed disability form to Dr. Godfrey office.

## 2024-06-10 NOTE — Telephone Encounter (Signed)
 Patient states his insurance needs more info on referral for cardiac rehab. Dana Washington  is his case manger she is with [employer] and her # [phone] fax #[phone]. His claim #[id number]. Please advise

## 2024-06-11 ENCOUNTER — Encounter (HOSPITAL_COMMUNITY)
Admission: RE | Admit: 2024-06-11 | Discharge: 2024-06-11 | Disposition: A | Discharge status: Home / Self Care | Source: Ambulatory Visit | Attending: Cardiology | Admitting: Cardiology

## 2024-06-11 DIAGNOSIS — I213 ST elevation (STEMI) myocardial infarction of unspecified site: Secondary | ICD-10-CM

## 2024-06-11 DIAGNOSIS — Z955 Presence of coronary angioplasty implant and graft: Secondary | ICD-10-CM

## 2024-06-11 NOTE — Progress Notes (Signed)
 Reviewed home exercise guidelines with [facility] including endpoints, temperature precautions, target heart rate and rate of perceived exertion. He is currently walking 3 days/week as his mode of home exercise. Andre Cowan voices understanding of instructions given.  [name], MS, ACSM CEP

## 2024-06-12 ENCOUNTER — Encounter: Admitting: Physical Therapy

## 2024-06-12 ENCOUNTER — Telehealth (HOSPITAL_COMMUNITY): Payer: Self-pay

## 2024-06-13 ENCOUNTER — Encounter (HOSPITAL_COMMUNITY)
Admission: RE | Admit: 2024-06-13 | Discharge: 2024-06-13 | Disposition: A | Discharge status: Home / Self Care | Source: Ambulatory Visit | Attending: Cardiology | Admitting: Cardiology

## 2024-06-13 DIAGNOSIS — Z955 Presence of coronary angioplasty implant and graft: Secondary | ICD-10-CM

## 2024-06-13 DIAGNOSIS — I213 ST elevation (STEMI) myocardial infarction of unspecified site: Secondary | ICD-10-CM | POA: Diagnosis not present

## 2024-06-13 NOTE — Progress Notes (Signed)
 Resting systolic BP's noted in the 90's. Patient asymptomatic. Will forward today's vital signs to [facility] NP and continue to monitor the patient.Hadassah Elpidio Quan RN BSN

## 2024-06-16 ENCOUNTER — Encounter (HOSPITAL_COMMUNITY)
Admission: RE | Admit: 2024-06-16 | Discharge: 2024-06-16 | Disposition: A | Discharge status: Home / Self Care | Source: Ambulatory Visit | Attending: Cardiology | Admitting: Cardiology

## 2024-06-16 DIAGNOSIS — Z955 Presence of coronary angioplasty implant and graft: Secondary | ICD-10-CM | POA: Diagnosis not present

## 2024-06-16 DIAGNOSIS — I213 ST elevation (STEMI) myocardial infarction of unspecified site: Secondary | ICD-10-CM | POA: Diagnosis not present

## 2024-06-17 ENCOUNTER — Encounter

## 2024-06-17 ENCOUNTER — Encounter (HOSPITAL_COMMUNITY): Payer: Self-pay

## 2024-06-17 NOTE — Progress Notes (Signed)
 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  Patient's Home Medications on Admission:  Current Outpatient Medications:    aspirin  81 MG chewable tablet, Chew 1 tablet (81 mg total) by mouth daily., Disp: 30 tablet, Rfl: 5   atorvastatin  (LIPITOR ) 80 MG tablet, Take 1 tablet (80 mg total) by mouth daily., Disp: 30 tablet, Rfl: 5   empagliflozin (JARDIANCE) 10 MG TABS tablet, Take 1 tablet (10 mg total) by mouth daily before breakfast., Disp: 30 tablet, Rfl: 5   metoprolol succinate (TOPROL XL) 25 MG 24 hr tablet, Take 0.5 tablets (12.5 mg total) by mouth daily., Disp: 45 tablet, Rfl: 1   pantoprazole  (PROTONIX ) 40 MG tablet, Take 1 tablet (40 mg total) by mouth daily., Disp: 30 tablet, Rfl: 5   prasugrel  (EFFIENT ) 10 MG TABS tablet, Take 1 tablet (10 mg total) by mouth daily., Disp: 30 tablet, Rfl: 5   spironolactone  (ALDACTONE ) 25 MG tablet, Take 0.5 tablets (12.5 mg total) by mouth at bedtime., Disp: 30 tablet, Rfl: 5   XTANDI 40 MG capsule, Take 80 mg by mouth every evening., Disp: , Rfl:   Past Medical History: Past Medical History:  Diagnosis Date   Cellulitis of neck 12/13/2021   Headache    Migraines   Prostate cancer (HCC)    Skin cancer screening 12/13/2021    Tobacco Use: Social History   Tobacco Use  Smoking Status Some Days   Current packs/day: 0.00   Average packs/day: 0.5 packs/day for 40.0 years (20.0 ttl pk-yrs)   Types: Cigarettes   Start date: 09/15/1975   Last attempt to  quit: 09/15/2015   Years since quitting: 8.7   Passive exposure: Current  Smokeless Tobacco Never    Labs: Review Flowsheet  More data exists      Latest Ref Rng & Units 05/16/2023 11/08/2023 05/02/2024 05/03/2024 05/04/2024  Labs for ITP Cardiac and Pulmonary Rehab  Cholestrol 0 - 200 mg/dL 761  783  813  - -  LDL (calc) 0 - 99 mg/dL 840  859  880  - -  HDL-C >40 mg/dL 45  44  38  - -  Trlycerides <150 mg/dL 812  821  853  - -  Hemoglobin A1c 4.8 - 5.6 % 6.0  6.0  5.7  - -  PH, Arterial 7.35 - 7.45 - - 7.427  - -  PCO2 arterial 32 - 48 mmHg - - 28.8  - -  Bicarbonate 20.0 - 28.0 mmol/L 20.0 - 28.0 mmol/L - - 21.2  19.0  - -  TCO2 22 - 32 mmol/L 22 - 32 mmol/L - - 22  20  - -  Acid-base deficit 0.0 - 2.0 mmol/L 0.0 - 2.0 mmol/L - - 4.0  4.0  - -  O2 Saturation % - - 57.8  60.2  51  97  58.6  62.1  65.3  78.8  61     Details       Multiple values from one day are sorted in reverse-chronological order  Capillary Blood Glucose: Lab Results  Component Value Date   GLUCAP 127 (H) 05/02/2024     Exercise Target Goals: Exercise Program Goal: Individual exercise prescription set using results from initial 6 min walk test and THRR while considering  patient's activity barriers and safety.   Exercise Prescription Goal: Initial exercise prescription builds to 30-45 minutes a day of aerobic activity, 2-3 days per week.  Home exercise guidelines will be given to patient during program as part of exercise prescription that the participant will acknowledge.  Activity Barriers & Risk Stratification:  Activity Barriers & Cardiac Risk Stratification - 05/20/24 1122       Activity Barriers & Cardiac Risk Stratification   Activity Barriers Other (comment);Decreased Ventricular Function    Comments Left hip pain/ spasm since hospitalization.    Cardiac Risk Stratification High          6 Minute Walk:  6 Minute Walk     Row Name 05/20/24 1230         6 Minute Walk    Phase Initial     Distance 971 feet     Walk Time 4.92 minutes     # of Rest Breaks 1  rested the last 1 minute 5 seconds     MPH 2.24     METS 2.57     RPE 9     Perceived Dyspnea  0     VO2 Peak 9     Symptoms Yes (comment)     Comments Left hip pain, 8/10 on pain scale. Chronic with walking since his hospitalization.     Resting HR 92 bpm     Resting BP 100/64     Resting Oxygen Saturation  100 %     Exercise Oxygen Saturation  during 6 min walk 100 %     Max Ex. HR 124 bpm     Max Ex. BP 120/76     2 Minute Post BP 100/64        Oxygen Initial Assessment:   Oxygen Re-Evaluation:   Oxygen Discharge (Final Oxygen Re-Evaluation):   Initial Exercise Prescription:  Initial Exercise Prescription - 05/20/24 1400       Date of Initial Exercise RX and Referring Provider   Date 05/20/24    Referring Provider Ladona Heinz, MD    Expected Discharge Date 08/13/24      Recumbant Bike   Level 1    RPM 30    Watts 17    Minutes 15    METs 2.6      NuStep   Level 1    SPM 85    Minutes 15    METs 2.6      Prescription Details   Frequency (times per week) 3    Duration Progress to 30 minutes of continuous aerobic without signs/symptoms of physical distress      Intensity   THRR 40-80% of Max Heartrate 61-122    Ratings of Perceived Exertion 11-13    Perceived Dyspnea 0-4      Progression   Progression Continue to progress workloads to maintain intensity without signs/symptoms of physical distress.      Resistance Training   Training Prescription Yes    Weight 4 lbs    Reps 10-15          Perform Capillary Blood Glucose checks as needed.  Exercise Prescription Changes:   Exercise Prescription Changes     Row Name 05/26/24 1037 06/02/24 1027 06/11/24 1025 06/16/24 1030  Response to Exercise   Blood Pressure (Admit) 94/56 100/64 108/66 112/72    Blood Pressure (Exercise) 144/78 110/62 122/64 --    Blood Pressure (Exit) 97/68 90/60 92/64  118/58     Heart Rate (Admit) 97 bpm 92 bpm 94 bpm 81 bpm    Heart Rate (Exercise) 131 bpm 119 bpm 126 bpm 119 bpm    Heart Rate (Exit) 106 bpm 101 bpm 102 bpm 94 bpm    Rating of Perceived Exertion (Exercise) 13 8 11 11     Symptoms None None None None    Comments Off to a good start with exercise. Increased WL on recumbent stepper today. Reviewed home exercise guidelines, METs, and goals with Will. Missed average on recumbent bike    Duration Continue with 30 min of aerobic exercise without signs/symptoms of physical distress. Continue with 30 min of aerobic exercise without signs/symptoms of physical distress. Continue with 30 min of aerobic exercise without signs/symptoms of physical distress. Continue with 30 min of aerobic exercise without signs/symptoms of physical distress.    Intensity THRR unchanged THRR unchanged THRR unchanged THRR unchanged      Progression   Progression Continue to progress workloads to maintain intensity without signs/symptoms of physical distress. Continue to progress workloads to maintain intensity without signs/symptoms of physical distress. Continue to progress workloads to maintain intensity without signs/symptoms of physical distress. Continue to progress workloads to maintain intensity without signs/symptoms of physical distress.    Average METs 1.9 2.1 3.4 2.9      Resistance Training   Training Prescription Yes Yes No Yes    Weight 4 lbs 4 lbs Relaxation day. No weights. 4 lbs    Reps 10-15 10-15 -- 10-15    Time 5 Minutes 5 Minutes -- 5 Minutes      Interval Training   Interval Training No No No No      Recumbant Bike   Level 1 2 2 2     RPM -- 77 89 --    Watts 14 24 54 --    Minutes 15 15 15 15     METs 2.1 2.3 3.8 --      NuStep   Level 1 2 4 4     SPM 64 79 86 100    Minutes 15 15 15 15     METs 1.7 2 3.1 2.9      Home Exercise Plan   Plans to continue exercise at -- -- Home (comment)  Walking Home (comment)  Walking    Frequency -- -- Add 3  additional days to program exercise sessions. Add 3 additional days to program exercise sessions.    Initial Home Exercises Provided -- -- 06/11/24 06/11/24       Exercise Comments:   Exercise Comments     Row Name 05/26/24 1134 06/11/24 0959         Exercise Comments Sargent tolerated low intensity exercise well without issues. Introduced him to the exercise equipment and stretching exercises. Reviewed home exercise guideline, METs, and goals with Will. MET level activity guide given.         Exercise Goals and Review:   Exercise Goals     Row Name 05/20/24 1122             Exercise Goals   Increase Physical Activity Yes       Intervention Provide advice, education, support and counseling about physical activity/exercise needs.;Develop an individualized exercise prescription for aerobic and resistive training based on initial evaluation findings, risk  stratification, comorbidities and participant's personal goals.       Expected Outcomes Short Term: Attend rehab on a regular basis to increase amount of physical activity.;Long Term: Exercising regularly at least 3-5 days a week.;Long Term: Add in home exercise to make exercise part of routine and to increase amount of physical activity.       Increase Strength and Stamina Yes       Intervention Provide advice, education, support and counseling about physical activity/exercise needs.;Develop an individualized exercise prescription for aerobic and resistive training based on initial evaluation findings, risk stratification, comorbidities and participant's personal goals.       Expected Outcomes Short Term: Increase workloads from initial exercise prescription for resistance, speed, and METs.;Short Term: Perform resistance training exercises routinely during rehab and add in resistance training at home;Long Term: Improve cardiorespiratory fitness, muscular endurance and strength as measured by increased METs and functional capacity  ( )       Able to understand and use rate of perceived exertion (RPE) scale Yes       Intervention Provide education and explanation on how to use RPE scale       Expected Outcomes Short Term: Able to use RPE daily in rehab to express subjective intensity level;Long Term:  Able to use RPE to guide intensity level when exercising independently       Knowledge and understanding of Target Heart Rate Range (THRR) Yes       Intervention Provide education and explanation of THRR including how the numbers were predicted and where they are located for reference       Expected Outcomes Short Term: Able to state/look up THRR;Long Term: Able to use THRR to govern intensity when exercising independently;Short Term: Able to use daily as guideline for intensity in rehab       Able to check pulse independently Yes       Intervention Provide education and demonstration on how to check pulse in carotid and radial arteries.;Review the importance of being able to check your own pulse for safety during independent exercise       Expected Outcomes Short Term: Able to explain why pulse checking is important during independent exercise;Long Term: Able to check pulse independently and accurately       Understanding of Exercise Prescription Yes       Intervention Provide education, explanation, and written materials on patient's individual exercise prescription       Expected Outcomes Short Term: Able to explain program exercise prescription;Long Term: Able to explain home exercise prescription to exercise independently          Exercise Goals Re-Evaluation :  Exercise Goals Re-Evaluation     Row Name 05/26/24 1134 06/11/24 0959           Exercise Goal Re-Evaluation   Exercise Goals Review Increase Physical Activity;Increase Strength and Stamina;Able to understand and use rate of perceived exertion (RPE) scale Increase Physical Activity;Increase Strength and Stamina;Able to understand and use rate of perceived  exertion (RPE) scale;Understanding of Exercise Prescription;Knowledge and understanding of Target Heart Rate Range (THRR)      Comments Jonpaul was able to understand and use RPE scale appropriately. Reviewed exercise prescription and MET level progression with Will. He is walking at home in addition to exercise at cardiac rehab.      Expected Outcomes Progress workloads as tolerated to help improve cardiorespiratory fitness. Continue walking 3 days/week in addition to exercise at cardiac rehab. Continue to progress workloads as tolerated to help  increase MET level.         Discharge Exercise Prescription (Final Exercise Prescription Changes):  Exercise Prescription Changes - 06/16/24 1030       Response to Exercise   Blood Pressure (Admit) 112/72    Blood Pressure (Exit) 118/58    Heart Rate (Admit) 81 bpm    Heart Rate (Exercise) 119 bpm    Heart Rate (Exit) 94 bpm    Rating of Perceived Exertion (Exercise) 11    Symptoms None    Comments Missed average on recumbent bike    Duration Continue with 30 min of aerobic exercise without signs/symptoms of physical distress.    Intensity THRR unchanged      Progression   Progression Continue to progress workloads to maintain intensity without signs/symptoms of physical distress.    Average METs 2.9      Resistance Training   Training Prescription Yes    Weight 4 lbs    Reps 10-15    Time 5 Minutes      Interval Training   Interval Training No      Recumbant Bike   Level 2    Minutes 15      NuStep   Level 4    SPM 100    Minutes 15    METs 2.9      Home Exercise Plan   Plans to continue exercise at Home (comment)   Walking   Frequency Add 3 additional days to program exercise sessions.    Initial Home Exercises Provided 06/11/24          Nutrition:  Target Goals: Understanding of nutrition guidelines, daily intake of sodium 1500mg , cholesterol 200mg , calories 30% from fat and 7% or less from saturated fats, daily  to have 5 or more servings of fruits and vegetables.  Biometrics:  Pre Biometrics - 05/20/24 1021       Pre Biometrics   Waist Circumference 43.5 inches    Hip Circumference 43 inches    Waist to Hip Ratio 1.01 %    Triceps Skinfold 18 mm    % Body Fat 29.9 %    Grip Strength 35 kg    Flexibility 10.63 in    Single Leg Stand 30 seconds           Nutrition Therapy Plan and Nutrition Goals:   Nutrition Assessments:  MEDIFICTS Score Key: >=70 Need to make dietary changes  40-70 Heart Healthy Diet <= 40 Therapeutic Level Cholesterol Diet   Flowsheet Row INTENSIVE CARDIAC REHAB ORIENT from 05/20/2024 in [facility], V[facility] & [facility]  Picture Your Plate Total Score on Admission 81   Picture Your Plate Scores: <59 Unhealthy dietary pattern with much room for improvement. 41-50 Dietary pattern unlikely to meet recommendations for good health and room for improvement. 51-60 More healthful dietary pattern, with some room for improvement.  >60 Healthy dietary pattern, although there may be some specific behaviors that could be improved.    Nutrition Goals Re-Evaluation:   Nutrition Goals Re-Evaluation:   Nutrition Goals Discharge (Final Nutrition Goals Re-Evaluation):   Psychosocial: Target Goals: Acknowledge presence or absence of significant depression and/or stress, maximize coping skills, provide positive support system. Participant is able to verbalize types and ability to use techniques and skills needed for reducing stress and depression.  Initial Review & Psychosocial Screening:  Initial Psych Review & Screening - 05/20/24 1056       Initial Review   Current issues with  None Identified      Family Dynamics   Good Support System? Yes    Comments Great support from wife, 2 daughters, and 1 son. 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      Barriers   Psychosocial barriers to participate in program There  are no identifiable barriers or psychosocial needs.      Screening Interventions   Interventions Encouraged to exercise    Expected Outcomes Short Term goal: Identification and review with participant of any Quality of Life or Depression concerns found by scoring the questionnaire.;Long Term goal: The participant improves quality of Life and PHQ9 Scores as seen by post scores and/or verbalization of changes          Quality of Life Scores:  Quality of Life - 05/20/24 1418       Quality of Life   Select Quality of Life      Quality of Life Scores   Health/Function Pre 16.6 %    Socioeconomic Pre 26.79 %    Psych/Spiritual Pre 21.36 %    Family Pre 22.6 %    GLOBAL Pre 20.56 %         Scores of 19 and below usually indicate a poorer quality of life in these areas.  A difference of  2-3 points is a clinically meaningful difference.  A difference of 2-3 points in the total score of the Quality of Life Index has been associated with significant improvement in overall quality of life, self-image, physical symptoms, and general health in studies assessing change in quality of life.  PHQ-9: Review Flowsheet  More data exists      05/20/2024 11/15/2023 05/16/2023 11/14/2022 05/15/2022  Depression screen PHQ 2/9  Decreased Interest 0 0 0 0 0 0  Down, Depressed, Hopeless 0 0 0 0 0 0  PHQ - 2 Score 0 0 0 0 0 0  Altered sleeping 3 0 0 - 0  Tired, decreased energy 2 0 0 - 0  Change in appetite 0 0 0 - 0  Feeling bad or failure about yourself  0 0 0 - 0  Trouble concentrating 0 0 0 - 0  Moving slowly or fidgety/restless 0 0 0 - 0  Suicidal thoughts 0 0 0 - 0  PHQ-9 Score 5 0 0 - 0  Difficult doing work/chores Not difficult at all Not difficult at all Not difficult at all - Not difficult at all    Details       Multiple values from one day are sorted in reverse-chronological order        Interpretation of Total Score  Total Score Depression Severity:  1-4 = Minimal depression, 5-9  = Mild depression, 10-14 = Moderate depression, 15-19 = Moderately severe depression, 20-27 = Severe depression   Psychosocial Evaluation and Intervention:   Psychosocial Re-Evaluation:  Psychosocial Re-Evaluation     Row Name 05/29/24 0904 06/13/24 1421           Psychosocial Re-Evaluation   Current issues with None Identified Current Sleep Concerns      Comments -- Gavriel says he has frequent urination due to previous prostate surgery. Sahir says his energy level is improving.      Interventions Encouraged to attend Cardiac Rehabilitation for the exercise Encouraged to attend Cardiac Rehabilitation for the exercise      Continue Psychosocial Services  No Follow up required No Follow up required         Psychosocial Discharge (Final  Psychosocial Re-Evaluation):  Psychosocial Re-Evaluation - 06/13/24 1421       Psychosocial Re-Evaluation   Current issues with Current Sleep Concerns    Comments Creighton says he has frequent urination due to previous prostate surgery. Arsenio says his energy level is improving.    Interventions Encouraged to attend Cardiac Rehabilitation for the exercise    Continue Psychosocial Services  No Follow up required          Vocational Rehabilitation: Provide vocational rehab assistance to qualifying candidates.   Vocational Rehab Evaluation & Intervention:  Vocational Rehab - 05/20/24 1121       Initial Vocational Rehab Evaluation & Intervention   Assessment shows need for Vocational Rehabilitation No      Vocational Rehab Re-Evaulation   Comments Tyland is actively employed and plans to return to his previous job. No VR needs.          Education: Education Goals: Education classes will be provided on a weekly basis, covering required topics. Participant will state understanding/return demonstration of topics presented.    Education     Row Name 05/26/24 1100     Education   Cardiac Education Topics Pritikin   Building Surveyor Psychosocial   Psychosocial Workshop From Head to Heart: The Power of a Healthy Outlook   Instruction Review Code 1- Verbalizes Understanding   Class Start Time 1150   Class Stop Time 1230   Class Time Calculation (min) 40 min    Row Name 06/04/24 1100     Education   Cardiac Education Topics Pritikin   Customer Service Manager   Weekly Topic Adding Flavor - Sodium-Free   Instruction Review Code 1- Verbalizes Understanding   Class Start Time 1145   Class Stop Time 1223   Class Time Calculation (min) 38 min    Row Name 06/11/24 1100     Education   Cardiac Education Topics Pritikin   Secondary School Teacher School   Educator Nurse;Respiratory Therapist   Weekly Topic Fast and Healthy Breakfasts   Instruction Review Code 1- Verbalizes Understanding   Class Start Time 1145   Class Stop Time 1225   Class Time Calculation (min) 40 min      Core Videos: Exercise    Move It!  Clinical staff conducted group or individual video education with verbal and written material and guidebook.  Patient learns the recommended Pritikin exercise program. Exercise with the goal of living a long, healthy life. Some of the health benefits of exercise include controlled diabetes, healthier blood pressure levels, improved cholesterol levels, improved heart and lung capacity, improved sleep, and better body composition. Everyone should speak with their doctor before starting or changing an exercise routine.  Biomechanical Limitations Clinical staff conducted group or individual video education with verbal and written material and guidebook.  Patient learns how biomechanical limitations can impact exercise and how we can mitigate and possibly overcome limitations to have an impactful and balanced exercise routine.  Body Composition Clinical staff conducted group or individual video education  with verbal and written material and guidebook.  Patient learns that body composition (ratio of muscle mass to fat mass) is a key component to assessing overall fitness, rather than body weight alone. Increased fat mass, especially visceral belly fat, can put us  at increased risk for metabolic syndrome, type 2 diabetes, heart disease,  and even death. It is recommended to combine diet and exercise (cardiovascular and resistance training) to improve your body composition. Seek guidance from your physician and exercise physiologist before implementing an exercise routine.  Exercise Action Plan Clinical staff conducted group or individual video education with verbal and written material and guidebook.  Patient learns the recommended strategies to achieve and enjoy long-term exercise adherence, including variety, self-motivation, self-efficacy, and positive decision making. Benefits of exercise include fitness, good health, weight management, more energy, better sleep, less stress, and overall well-being.  Medical   Heart Disease Risk Reduction Clinical staff conducted group or individual video education with verbal and written material and guidebook.  Patient learns our heart is our most vital organ as it circulates oxygen, nutrients, white blood cells, and hormones throughout the entire body, and carries waste away. Data supports a plant-based eating plan like the Pritikin Program for its effectiveness in slowing progression of and reversing heart disease. The video provides a number of recommendations to address heart disease.   Metabolic Syndrome and Belly Fat  Clinical staff conducted group or individual video education with verbal and written material and guidebook.  Patient learns what metabolic syndrome is, how it leads to heart disease, and how one can reverse it and keep it from coming back. You have metabolic syndrome if you have 3 of the following 5 criteria: abdominal obesity, high blood  pressure, high triglycerides, low HDL cholesterol, and high blood sugar.  Hypertension and Heart Disease Clinical staff conducted group or individual video education with verbal and written material and guidebook.  Patient learns that high blood pressure, or hypertension, is very common in the [place] . Hypertension is largely due to excessive salt intake, but other important risk factors include being overweight, physical inactivity, drinking too much alcohol, smoking, and not eating enough potassium from fruits and vegetables. High blood pressure is a leading risk factor for heart attack, stroke, congestive heart failure, dementia, kidney failure, and premature death. Long-term effects of excessive salt intake include stiffening of the arteries and thickening of heart muscle and organ damage. Recommendations include ways to reduce hypertension and the risk of heart disease.  Diseases of Our Time - Focusing on Diabetes Clinical staff conducted group or individual video education with verbal and written material and guidebook.  Patient learns why the best way to stop diseases of our time is prevention, through food and other lifestyle changes. Medicine (such as prescription pills and surgeries) is often only a Band-Aid on the problem, not a long-term solution. Most common diseases of our time include obesity, type 2 diabetes, hypertension, heart disease, and cancer. The Pritikin Program is recommended and has been proven to help reduce, reverse, and/or prevent the damaging effects of metabolic syndrome.  Nutrition   Overview of the Pritikin Eating Plan  Clinical staff conducted group or individual video education with verbal and written material and guidebook.  Patient learns about the Pritikin Eating Plan for disease risk reduction. The Pritikin Eating Plan emphasizes a wide variety of unrefined, minimally-processed carbohydrates, like fruits, vegetables, whole grains, and legumes. Go, Caution,  and Stop food choices are explained. Plant-based and lean animal proteins are emphasized. Rationale provided for low sodium intake for blood pressure control, low added sugars for blood sugar stabilization, and low added fats and oils for coronary artery disease risk reduction and weight management.  Calorie Density  Clinical staff conducted group or individual video education with verbal and written material and guidebook.  Patient learns about calorie  density and how it impacts the Pritikin Eating Plan. Knowing the characteristics of the food you choose will help you decide whether those foods will lead to weight gain or weight loss, and whether you want to consume more or less of them. Weight loss is usually a side effect of the Pritikin Eating Plan because of its focus on low calorie-dense foods.  Label Reading  Clinical staff conducted group or individual video education with verbal and written material and guidebook.  Patient learns about the Pritikin recommended label reading guidelines and corresponding recommendations regarding calorie density, added sugars, sodium content, and whole grains.  Dining Out - Part 1  Clinical staff conducted group or individual video education with verbal and written material and guidebook.  Patient learns that restaurant meals can be sabotaging because they can be so high in calories, fat, sodium, and/or sugar. Patient learns recommended strategies on how to positively address this and avoid unhealthy pitfalls.  Facts on Fats  Clinical staff conducted group or individual video education with verbal and written material and guidebook.  Patient learns that lifestyle modifications can be just as effective, if not more so, as many medications for lowering your risk of heart disease. A Pritikin lifestyle can help to reduce your risk of inflammation and atherosclerosis (cholesterol build-up, or plaque, in the artery walls). Lifestyle interventions such as dietary  choices and physical activity address the cause of atherosclerosis. A review of the types of fats and their impact on blood cholesterol levels, along with dietary recommendations to reduce fat intake is also included.  Nutrition Action Plan  Clinical staff conducted group or individual video education with verbal and written material and guidebook.  Patient learns how to incorporate Pritikin recommendations into their lifestyle. Recommendations include planning and keeping personal health goals in mind as an important part of their success.  Healthy Mind-Set    Healthy Minds, Bodies, Hearts  Clinical staff conducted group or individual video education with verbal and written material and guidebook.  Patient learns how to identify when they are stressed. Video will discuss the impact of that stress, as well as the many benefits of stress management. Patient will also be introduced to stress management techniques. The way we think, act, and feel has an impact on our hearts.  How Our Thoughts Can Heal Our Hearts  Clinical staff conducted group or individual video education with verbal and written material and guidebook.  Patient learns that negative thoughts can cause depression and anxiety. This can result in negative lifestyle behavior and serious health problems. Cognitive behavioral therapy is an effective method to help control our thoughts in order to change and improve our emotional outlook.  Additional Videos:  Exercise    Improving Performance  Clinical staff conducted group or individual video education with verbal and written material and guidebook.  Patient learns to use a non-linear approach by alternating intensity levels and lengths of time spent exercising to help burn more calories and lose more body fat. Cardiovascular exercise helps improve heart health, metabolism, hormonal balance, blood sugar control, and recovery from fatigue. Resistance training improves strength, endurance,  balance, coordination, reaction time, metabolism, and muscle mass. Flexibility exercise improves circulation, posture, and balance. Seek guidance from your physician and exercise physiologist before implementing an exercise routine and learn your capabilities and proper form for all exercise.  Introduction to Yoga  Clinical staff conducted group or individual video education with verbal and written material and guidebook.  Patient learns about yoga, a discipline of  the coming together of mind, breath, and body. The benefits of yoga include improved flexibility, improved range of motion, better posture and core strength, increased lung function, weight loss, and positive self-image. Yoga's heart health benefits include lowered blood pressure, healthier heart rate, decreased cholesterol and triglyceride levels, improved immune function, and reduced stress. Seek guidance from your physician and exercise physiologist before implementing an exercise routine and learn your capabilities and proper form for all exercise.  Medical   Aging: Enhancing Your Quality of Life  Clinical staff conducted group or individual video education with verbal and written material and guidebook.  Patient learns key strategies and recommendations to stay in good physical health and enhance quality of life, such as prevention strategies, having an advocate, securing a Health Care Proxy and Power of Attorney, and keeping a list of medications and system for tracking them. It also discusses how to avoid risk for bone loss.  Biology of Weight Control  Clinical staff conducted group or individual video education with verbal and written material and guidebook.  Patient learns that weight gain occurs because we consume more calories than we burn (eating more, moving less). Even if your body weight is normal, you may have higher ratios of fat compared to muscle mass. Too much body fat puts you at increased risk for cardiovascular disease,  heart attack, stroke, type 2 diabetes, and obesity-related cancers. In addition to exercise, following the Pritikin Eating Plan can help reduce your risk.  Decoding Lab Results  Clinical staff conducted group or individual video education with verbal and written material and guidebook.  Patient learns that lab test reflects one measurement whose values change over time and are influenced by many factors, including medication, stress, sleep, exercise, food, hydration, pre-existing medical conditions, and more. It is recommended to use the knowledge from this video to become more involved with your lab results and evaluate your numbers to speak with your doctor.   Diseases of Our Time - Overview  Clinical staff conducted group or individual video education with verbal and written material and guidebook.  Patient learns that according to the CDC, 50% to 70% of chronic diseases (such as obesity, type 2 diabetes, elevated lipids, hypertension, and heart disease) are avoidable through lifestyle improvements including healthier food choices, listening to satiety cues, and increased physical activity.  Sleep Disorders Clinical staff conducted group or individual video education with verbal and written material and guidebook.  Patient learns how good quality and duration of sleep are important to overall health and well-being. Patient also learns about sleep disorders and how they impact health along with recommendations to address them, including discussing with a physician.  Nutrition  Dining Out - Part 2 Clinical staff conducted group or individual video education with verbal and written material and guidebook.  Patient learns how to plan ahead and communicate in order to maximize their dining experience in a healthy and nutritious manner. Included are recommended food choices based on the type of restaurant the patient is visiting.   Fueling a Banker conducted group or  individual video education with verbal and written material and guidebook.  There is a strong connection between our food choices and our health. Diseases like obesity and type 2 diabetes are very prevalent and are in large-part due to lifestyle choices. The Pritikin Eating Plan provides plenty of food and hunger-curbing satisfaction. It is easy to follow, affordable, and helps reduce health risks.  Menu Workshop  Clinical staff conducted group or  individual video education with verbal and written material and guidebook.  Patient learns that restaurant meals can sabotage health goals because they are often packed with calories, fat, sodium, and sugar. Recommendations include strategies to plan ahead and to communicate with the manager, chef, or server to help order a healthier meal.  Planning Your Eating Strategy  Clinical staff conducted group or individual video education with verbal and written material and guidebook.  Patient learns about the Pritikin Eating Plan and its benefit of reducing the risk of disease. The Pritikin Eating Plan does not focus on calories. Instead, it emphasizes high-quality, nutrient-rich foods. By knowing the characteristics of the foods, we choose, we can determine their calorie density and make informed decisions.  Targeting Your Nutrition Priorities  Clinical staff conducted group or individual video education with verbal and written material and guidebook.  Patient learns that lifestyle habits have a tremendous impact on disease risk and progression. This video provides eating and physical activity recommendations based on your personal health goals, such as reducing LDL cholesterol, losing weight, preventing or controlling type 2 diabetes, and reducing high blood pressure.  Vitamins and Minerals  Clinical staff conducted group or individual video education with verbal and written material and guidebook.  Patient learns different ways to obtain key vitamins and  minerals, including through a recommended healthy diet. It is important to discuss all supplements you take with your doctor.   Healthy Mind-Set    Smoking Cessation  Clinical staff conducted group or individual video education with verbal and written material and guidebook.  Patient learns that cigarette smoking and tobacco addiction pose a serious health risk which affects millions of people. Stopping smoking will significantly reduce the risk of heart disease, lung disease, and many forms of cancer. Recommended strategies for quitting are covered, including working with your doctor to develop a successful plan.  Culinary   Becoming a Set Designer conducted group or individual video education with verbal and written material and guidebook.  Patient learns that cooking at home can be healthy, cost-effective, quick, and puts them in control. Keys to cooking healthy recipes will include looking at your recipe, assessing your equipment needs, planning ahead, making it simple, choosing cost-effective seasonal ingredients, and limiting the use of added fats, salts, and sugars.  Cooking - Breakfast and Snacks  Clinical staff conducted group or individual video education with verbal and written material and guidebook.  Patient learns how important breakfast is to satiety and nutrition through the entire day. Recommendations include key foods to eat during breakfast to help stabilize blood sugar levels and to prevent overeating at meals later in the day. Planning ahead is also a key component.  Cooking - Educational Psychologist conducted group or individual video education with verbal and written material and guidebook.  Patient learns eating strategies to improve overall health, including an approach to cook more at home. Recommendations include thinking of animal protein as a side on your plate rather than center stage and focusing instead on lower calorie dense options like  vegetables, fruits, whole grains, and plant-based proteins, such as beans. Making sauces in large quantities to freeze for later and leaving the skin on your vegetables are also recommended to maximize your experience.  Cooking - Healthy Salads and Dressing Clinical staff conducted group or individual video education with verbal and written material and guidebook.  Patient learns that vegetables, fruits, whole grains, and legumes are the foundations of the Pritikin Eating Plan.  Recommendations include how to incorporate each of these in flavorful and healthy salads, and how to create homemade salad dressings. Proper handling of ingredients is also covered. Cooking - Soups and [employer] - Soups and Desserts Clinical staff conducted group or individual video education with verbal and written material and guidebook.  Patient learns that Pritikin soups and desserts make for easy, nutritious, and delicious snacks and meal components that are low in sodium, fat, sugar, and calorie density, while high in vitamins, minerals, and filling fiber. Recommendations include simple and healthy ideas for soups and desserts.   Overview     The Pritikin Solution Program Overview Clinical staff conducted group or individual video education with verbal and written material and guidebook.  Patient learns that the results of the Pritikin Program have been documented in more than 100 articles published in peer-reviewed journals, and the benefits include reducing risk factors for (and, in some cases, even reversing) high cholesterol, high blood pressure, type 2 diabetes, obesity, and more! An overview of the three key pillars of the Pritikin Program will be covered: eating well, doing regular exercise, and having a healthy mind-set.  WORKSHOPS  Exercise: Exercise Basics: Building Your Action Plan Clinical staff led group instruction and group discussion with PowerPoint presentation and patient guidebook. To enhance  the learning environment the use of posters, models and videos may be added. At the conclusion of this workshop, patients will comprehend the difference between physical activity and exercise, as well as the benefits of incorporating both, into their routine. Patients will understand the FITT (Frequency, Intensity, Time, and Type) principle and how to use it to build an exercise action plan. In addition, safety concerns and other considerations for exercise and cardiac rehab will be addressed by the presenter. The purpose of this lesson is to promote a comprehensive and effective weekly exercise routine in order to improve patients' overall level of fitness.   Managing Heart Disease: Your Path to a Healthier Heart Clinical staff led group instruction and group discussion with PowerPoint presentation and patient guidebook. To enhance the learning environment the use of posters, models and videos may be added.At the conclusion of this workshop, patients will understand the anatomy and physiology of the heart. Additionally, they will understand how Pritikin's three pillars impact the risk factors, the progression, and the management of heart disease.  The purpose of this lesson is to provide a high-level overview of the heart, heart disease, and how the Pritikin lifestyle positively impacts risk factors.  Exercise Biomechanics Clinical staff led group instruction and group discussion with PowerPoint presentation and patient guidebook. To enhance the learning environment the use of posters, models and videos may be added. Patients will learn how the structural parts of their bodies function and how these functions impact their daily activities, movement, and exercise. Patients will learn how to promote a neutral spine, learn how to manage pain, and identify ways to improve their physical movement in order to promote healthy living. The purpose of this lesson is to expose patients to common  physical limitations that impact physical activity. Participants will learn practical ways to adapt and manage aches and pains, and to minimize their effect on regular exercise. Patients will learn how to maintain good posture while sitting, walking, and lifting.  Balance Training and Fall Prevention  Clinical staff led group instruction and group discussion with PowerPoint presentation and patient guidebook. To enhance the learning environment the use of posters, models and videos may be added. At the  conclusion of this workshop, patients will understand the importance of their sensorimotor skills (vision, proprioception, and the vestibular system) in maintaining their ability to balance as they age. Patients will apply a variety of balancing exercises that are appropriate for their current level of function. Patients will understand the common causes for poor balance, possible solutions to these problems, and ways to modify their physical environment in order to minimize their fall risk. The purpose of this lesson is to teach patients about the importance of maintaining balance as they age and ways to minimize their risk of falling.  WORKSHOPS   Nutrition:  Fueling a Ship Broker led group instruction and group discussion with PowerPoint presentation and patient guidebook. To enhance the learning environment the use of posters, models and videos may be added. Patients will review the foundational principles of the Pritikin Eating Plan and understand what constitutes a serving size in each of the food groups. Patients will also learn Pritikin-friendly foods that are better choices when away from home and review make-ahead meal and snack options. Calorie density will be reviewed and applied to three nutrition priorities: weight maintenance, weight loss, and weight gain. The purpose of this lesson is to reinforce (in a group setting) the key concepts around what patients are  recommended to eat and how to apply these guidelines when away from home by planning and selecting Pritikin-friendly options. Patients will understand how calorie density may be adjusted for different weight management goals.  Mindful Eating  Clinical staff led group instruction and group discussion with PowerPoint presentation and patient guidebook. To enhance the learning environment the use of posters, models and videos may be added. Patients will briefly review the concepts of the Pritikin Eating Plan and the importance of low-calorie dense foods. The concept of mindful eating will be introduced as well as the importance of paying attention to internal hunger signals. Triggers for non-hunger eating and techniques for dealing with triggers will be explored. The purpose of this lesson is to provide patients with the opportunity to review the basic principles of the Pritikin Eating Plan, discuss the value of eating mindfully and how to measure internal cues of hunger and fullness using the Hunger Scale. Patients will also discuss reasons for non-hunger eating and learn strategies to use for controlling emotional eating.  Targeting Your Nutrition Priorities Clinical staff led group instruction and group discussion with PowerPoint presentation and patient guidebook. To enhance the learning environment the use of posters, models and videos may be added. Patients will learn how to determine their genetic susceptibility to disease by reviewing their family history. Patients will gain insight into the importance of diet as part of an overall healthy lifestyle in mitigating the impact of genetics and other environmental insults. The purpose of this lesson is to provide patients with the opportunity to assess their personal nutrition priorities by looking at their family history, their own health history and current risk factors. Patients will also be able to discuss ways of prioritizing and modifying the Pritikin  Eating Plan for their highest risk areas  Menu  Clinical staff led group instruction and group discussion with PowerPoint presentation and patient guidebook. To enhance the learning environment the use of posters, models and videos may be added. Using menus brought in from [employer], or printed from [employer], patients will apply the Pritikin dining out guidelines that were presented in the [employer] video. Patients will also be able to practice these guidelines in a  variety of provided scenarios. The purpose of this lesson is to provide patients with the opportunity to practice hands-on learning of the Pritikin Dining Out guidelines with actual menus and practice scenarios.  Label Reading Clinical staff led group instruction and group discussion with PowerPoint presentation and patient guidebook. To enhance the learning environment the use of posters, models and videos may be added. Patients will review and discuss the Pritikin label reading guidelines presented in Pritikin's Label Reading Educational series video. Using fool labels brought in from local grocery stores and markets, patients will apply the label reading guidelines and determine if the packaged food meet the Pritikin guidelines. The purpose of this lesson is to provide patients with the opportunity to review, discuss, and practice hands-on learning of the Pritikin Label Reading guidelines with actual packaged food labels. Cooking School  Pritikin's [employer] are designed to teach patients ways to prepare quick, simple, and affordable recipes at home. The importance of nutrition's role in chronic disease risk reduction is reflected in its emphasis in the overall Pritikin program. By learning how to prepare essential core Pritikin Eating Plan recipes, patients will increase control over what they eat; be able to customize the flavor of foods without the use of added salt, sugar, or fat; and  improve the quality of the food they consume. By learning a set of core recipes which are easily assembled, quickly prepared, and affordable, patients are more likely to prepare more healthy foods at home. These workshops focus on convenient breakfasts, simple entres, side dishes, and desserts which can be prepared with minimal effort and are consistent with nutrition recommendations for cardiovascular risk reduction. Cooking [employer] are taught by a armed forces logistics/support/administrative officer (RD) who has been trained by the [employer]. The chef or RD has a clear understanding of the importance of minimizing - if not completely eliminating - added fat, sugar, and sodium in recipes. Throughout the series of Cooking School Workshop sessions, patients will learn about healthy ingredients and efficient methods of cooking to build confidence in their capability to prepare    Cooking School weekly topics:  Adding Flavor- Sodium-Free  Fast and Healthy Breakfasts  Powerhouse Plant-Based Proteins  Satisfying Salads and Dressings  Simple Sides and Sauces  International Cuisine-Spotlight on the [employer] Zones  Delicious Desserts  Savory Soups  [employer] - Meals in a Astronomer Appetizers and Snacks  Comforting Weekend Breakfasts  One-Pot Wonders   Fast Evening Meals  Landscape Architect Your Pritikin Plate  WORKSHOPS   Healthy Mindset (Psychosocial):  Focused Goals, Sustainable Changes Clinical staff led group instruction and group discussion with PowerPoint presentation and patient guidebook. To enhance the learning environment the use of posters, models and videos may be added. Patients will be able to apply effective goal setting strategies to establish at least one personal goal, and then take consistent, meaningful action toward that goal. They will learn to identify common barriers to achieving personal goals and develop strategies to overcome them. Patients will  also gain an understanding of how our mind-set can impact our ability to achieve goals and the importance of cultivating a positive and growth-oriented mind-set. The purpose of this lesson is to provide patients with a deeper understanding of how to set and achieve personal goals, as well as the tools and strategies needed to overcome common obstacles which may arise along the way.  From Head to Heart: The Power of a Healthy Outlook  Clinical staff led  group instruction and group discussion with PowerPoint presentation and patient guidebook. To enhance the learning environment the use of posters, models and videos may be added. Patients will be able to recognize and describe the impact of emotions and mood on physical health. They will discover the importance of self-care and explore self-care practices which may work for them. Patients will also learn how to utilize the 4 C's to cultivate a healthier outlook and better manage stress and challenges. The purpose of this lesson is to demonstrate to patients how a healthy outlook is an essential part of maintaining good health, especially as they continue their cardiac rehab journey.  Healthy Sleep for a Healthy Heart Clinical staff led group instruction and group discussion with PowerPoint presentation and patient guidebook. To enhance the learning environment the use of posters, models and videos may be added. At the conclusion of this workshop, patients will be able to demonstrate knowledge of the importance of sleep to overall health, well-being, and quality of life. They will understand the symptoms of, and treatments for, common sleep disorders. Patients will also be able to identify daytime and nighttime behaviors which impact sleep, and they will be able to apply these tools to help manage sleep-related challenges. The purpose of this lesson is to provide patients with a general overview of sleep and outline the importance of quality sleep. Patients will  learn about a few of the most common sleep disorders. Patients will also be introduced to the concept of "sleep hygiene," and discover ways to self-manage certain sleeping problems through simple daily behavior changes. Finally, the workshop will motivate patients by clarifying the links between quality sleep and their goals of heart-healthy living.   Recognizing and Reducing Stress Clinical staff led group instruction and group discussion with PowerPoint presentation and patient guidebook. To enhance the learning environment the use of posters, models and videos may be added. At the conclusion of this workshop, patients will be able to understand the types of stress reactions, differentiate between acute and chronic stress, and recognize the impact that chronic stress has on their health. They will also be able to apply different coping mechanisms, such as reframing negative self-talk. Patients will have the opportunity to practice a variety of stress management techniques, such as deep abdominal breathing, progressive muscle relaxation, and/or guided imagery.  The purpose of this lesson is to educate patients on the role of stress in their lives and to provide healthy techniques for coping with it.  Learning Barriers/Preferences:  Learning Barriers/Preferences - 05/20/24 1055       Learning Barriers/Preferences   Learning Barriers None    Learning Preferences Computer/Internet          Education Topics:  Knowledge Questionnaire Score:  Knowledge Questionnaire Score - 05/20/24 1419       Knowledge Questionnaire Score   Pre Score 24/24          Core Components/Risk Factors/Patient Goals at Admission:  Personal Goals and Risk Factors at Admission - 05/20/24 1122       Core Components/Risk Factors/Patient Goals on Admission   Heart Failure Yes    Intervention Provide a combined exercise and nutrition program that is supplemented with education, support and counseling about heart  failure. Directed toward relieving symptoms such as shortness of breath, decreased exercise tolerance, and extremity edema.    Expected Outcomes Improve functional capacity of life;Short term: Attendance in program 2-3 days a week with increased exercise capacity. Reported lower sodium intake. Reported increased fruit and  vegetable intake. Reports medication compliance.;Short term: Daily weights obtained and reported for increase. Utilizing diuretic protocols set by physician.;Long term: Adoption of self-care skills and reduction of barriers for early signs and symptoms recognition and intervention leading to self-care maintenance.    Lipids Yes    Intervention Provide education and support for participant on nutrition & aerobic/resistive exercise along with prescribed medications to achieve LDL 70mg , HDL >40mg .    Expected Outcomes Short Term: Participant states understanding of desired cholesterol values and is compliant with medications prescribed. Participant is following exercise prescription and nutrition guidelines.;Long Term: Cholesterol controlled with medications as prescribed, with individualized exercise RX and with personalized nutrition plan. Value goals: LDL < 70mg , HDL > 40 mg.    Personal Goal Other Yes    Personal Goal Increase daily physical activity level. Improve results on all blood panels. Increase heart strength to acceptable levels to ensure self-sufficiency.    Intervention Develop and individualized exercise action plan that Demetres can do at home to help improve cardiac function and ability to remain self-sufficient. Develop a nutrition action plan that Christyan can follow to help with lipid level and other pertinent blood panels.    Expected Outcomes Timmie will comply with and maintain exercise and nutrition action plan to help improve cardiac function and overall health.          Core Components/Risk Factors/Patient Goals Review:   Goals and Risk Factor Review     Row  Name 05/29/24 0908 06/13/24 1441           Core Components/Risk Factors/Patient Goals Review   Personal Goals Review Weight Management/Obesity;Heart Failure;Lipids;Stress Weight Management/Obesity;Heart Failure;Lipids;Stress      Review Rashed started cardiac rehab on 05/26/24. Maxey did well with exercise for his fitness level.Keyaan started physical therapy for chronic hip pain. Dawon started cardiac rehab on 05/26/24. Jerimie is off to a good with exercise. Mister has increased his met levels. Resting systolic BP's have been in the 90's asymptomatic. Will forward to [facility] NP.      Expected Outcomes Osaze will continue to participate in cardiac rehab for exercise, nutrition and lifestyle modifications Keiffer will continue to participate in cardiac rehab for exercise, nutrition and lifestyle modifications         Core Components/Risk Factors/Patient Goals at Discharge (Final Review):   Goals and Risk Factor Review - 06/13/24 1441       Core Components/Risk Factors/Patient Goals Review   Personal Goals Review Weight Management/Obesity;Heart Failure;Lipids;Stress    Review Damel started cardiac rehab on 05/26/24. Jacson is off to a good with exercise. Quinnlan has increased his met levels. Resting systolic BP's have been in the 90's asymptomatic. Will forward to [facility].    Expected Outcomes Treveon will continue to participate in cardiac rehab for exercise, nutrition and lifestyle modifications          ITP Comments:  ITP Comments     Row Name 05/20/24 1021 05/29/24 0903 06/13/24 1419       ITP Comments Medical Director- Dr. Wilbert Bihari, MD. Introduction to the [facility]/ Intensive Cardiac Rehab Program. Reviewed initial orientation folder with Will. 30 Day ITP Review. Tahjir started cardiac rehab on 05/26/24. Romy did well with exercise. 30 Day ITP Review. Dominick started cardiac rehab on 05/26/24. Cobin is off to a good start with exercise.         Comments: See ITP Comments

## 2024-06-18 ENCOUNTER — Encounter (HOSPITAL_COMMUNITY)
Admission: RE | Admit: 2024-06-18 | Discharge: 2024-06-18 | Disposition: A | Source: Ambulatory Visit | Attending: Cardiology

## 2024-06-18 DIAGNOSIS — I213 ST elevation (STEMI) myocardial infarction of unspecified site: Secondary | ICD-10-CM | POA: Diagnosis not present

## 2024-06-18 DIAGNOSIS — Z955 Presence of coronary angioplasty implant and graft: Secondary | ICD-10-CM

## 2024-06-18 NOTE — Telephone Encounter (Signed)
 Duplicate note

## 2024-06-19 ENCOUNTER — Encounter: Admitting: Physical Therapy

## 2024-06-20 ENCOUNTER — Encounter (HOSPITAL_COMMUNITY)
Admission: RE | Admit: 2024-06-20 | Discharge: 2024-06-20 | Disposition: A | Discharge status: Home / Self Care | Source: Ambulatory Visit | Attending: Cardiology | Admitting: Cardiology

## 2024-06-20 DIAGNOSIS — Z955 Presence of coronary angioplasty implant and graft: Secondary | ICD-10-CM

## 2024-06-20 DIAGNOSIS — I213 ST elevation (STEMI) myocardial infarction of unspecified site: Secondary | ICD-10-CM

## 2024-06-23 ENCOUNTER — Encounter (HOSPITAL_COMMUNITY)
Admission: RE | Admit: 2024-06-23 | Discharge: 2024-06-23 | Disposition: A | Discharge status: Home / Self Care | Source: Ambulatory Visit | Attending: Cardiology | Admitting: Cardiology

## 2024-06-23 DIAGNOSIS — Z955 Presence of coronary angioplasty implant and graft: Secondary | ICD-10-CM

## 2024-06-23 DIAGNOSIS — I213 ST elevation (STEMI) myocardial infarction of unspecified site: Secondary | ICD-10-CM

## 2024-06-24 ENCOUNTER — Encounter

## 2024-06-25 ENCOUNTER — Encounter (HOSPITAL_COMMUNITY)
Admission: RE | Admit: 2024-06-25 | Discharge: 2024-06-25 | Disposition: A | Discharge status: Home / Self Care | Source: Ambulatory Visit | Attending: Cardiology | Admitting: Cardiology

## 2024-06-25 DIAGNOSIS — Z955 Presence of coronary angioplasty implant and graft: Secondary | ICD-10-CM

## 2024-06-25 DIAGNOSIS — I213 ST elevation (STEMI) myocardial infarction of unspecified site: Secondary | ICD-10-CM

## 2024-06-26 ENCOUNTER — Encounter: Admitting: Physical Therapy

## 2024-06-27 ENCOUNTER — Encounter (HOSPITAL_COMMUNITY)
Admission: RE | Admit: 2024-06-27 | Discharge: 2024-06-27 | Disposition: A | Discharge status: Home / Self Care | Source: Ambulatory Visit | Attending: Cardiology | Admitting: Cardiology

## 2024-06-27 DIAGNOSIS — I213 ST elevation (STEMI) myocardial infarction of unspecified site: Secondary | ICD-10-CM

## 2024-06-27 DIAGNOSIS — Z955 Presence of coronary angioplasty implant and graft: Secondary | ICD-10-CM

## 2024-06-30 ENCOUNTER — Encounter (HOSPITAL_COMMUNITY)
Admission: RE | Admit: 2024-06-30 | Discharge: 2024-06-30 | Disposition: A | Discharge status: Home / Self Care | Source: Ambulatory Visit | Attending: Cardiology | Admitting: Cardiology

## 2024-06-30 DIAGNOSIS — Z955 Presence of coronary angioplasty implant and graft: Secondary | ICD-10-CM

## 2024-06-30 DIAGNOSIS — I213 ST elevation (STEMI) myocardial infarction of unspecified site: Secondary | ICD-10-CM | POA: Diagnosis not present

## 2024-07-01 ENCOUNTER — Encounter

## 2024-07-02 ENCOUNTER — Encounter (HOSPITAL_COMMUNITY)
Admission: RE | Admit: 2024-07-02 | Discharge: 2024-07-02 | Disposition: A | Source: Ambulatory Visit | Attending: Cardiology

## 2024-07-02 DIAGNOSIS — I213 ST elevation (STEMI) myocardial infarction of unspecified site: Secondary | ICD-10-CM

## 2024-07-02 DIAGNOSIS — Z955 Presence of coronary angioplasty implant and graft: Secondary | ICD-10-CM

## 2024-07-03 ENCOUNTER — Encounter

## 2024-07-04 ENCOUNTER — Encounter (HOSPITAL_COMMUNITY)
Admission: RE | Admit: 2024-07-04 | Discharge: 2024-07-04 | Disposition: A | Discharge status: Home / Self Care | Source: Ambulatory Visit | Attending: Cardiology | Admitting: Cardiology

## 2024-07-04 DIAGNOSIS — Z955 Presence of coronary angioplasty implant and graft: Secondary | ICD-10-CM

## 2024-07-04 DIAGNOSIS — I213 ST elevation (STEMI) myocardial infarction of unspecified site: Secondary | ICD-10-CM

## 2024-07-07 ENCOUNTER — Encounter (HOSPITAL_COMMUNITY)
Admission: RE | Admit: 2024-07-07 | Discharge: 2024-07-07 | Disposition: A | Discharge status: Home / Self Care | Source: Ambulatory Visit | Attending: Cardiology | Admitting: Cardiology

## 2024-07-07 DIAGNOSIS — I213 ST elevation (STEMI) myocardial infarction of unspecified site: Secondary | ICD-10-CM

## 2024-07-07 DIAGNOSIS — Z955 Presence of coronary angioplasty implant and graft: Secondary | ICD-10-CM

## 2024-07-08 ENCOUNTER — Encounter: Admitting: Physical Therapy

## 2024-07-09 ENCOUNTER — Encounter (HOSPITAL_COMMUNITY)
Admission: RE | Admit: 2024-07-09 | Discharge: 2024-07-09 | Disposition: A | Source: Ambulatory Visit | Attending: Cardiology

## 2024-07-09 DIAGNOSIS — Z955 Presence of coronary angioplasty implant and graft: Secondary | ICD-10-CM

## 2024-07-09 DIAGNOSIS — I213 ST elevation (STEMI) myocardial infarction of unspecified site: Secondary | ICD-10-CM | POA: Diagnosis not present

## 2024-07-11 ENCOUNTER — Encounter (HOSPITAL_COMMUNITY)

## 2024-07-14 ENCOUNTER — Encounter (HOSPITAL_COMMUNITY)

## 2024-07-15 ENCOUNTER — Encounter

## 2024-07-15 NOTE — Progress Notes (Signed)
 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  Patient's Home Medications on Admission:  Current Outpatient Medications:    aspirin  81 MG chewable tablet, Chew 1 tablet (81 mg total) by mouth daily., Disp: 30 tablet, Rfl: 5   atorvastatin  (LIPITOR ) 80 MG tablet, Take 1 tablet (80 mg total) by mouth daily., Disp: 30 tablet, Rfl: 5   empagliflozin  (JARDIANCE ) 10 MG TABS tablet, Take 1 tablet (10 mg total) by mouth daily before breakfast., Disp: 30 tablet, Rfl: 5   metoprolol  succinate (TOPROL  XL) 25 MG 24 hr tablet, Take 0.5 tablets (12.5 mg total) by mouth daily., Disp: 45 tablet, Rfl: 1   pantoprazole  (PROTONIX ) 40 MG tablet, Take 1 tablet (40 mg total) by mouth daily., Disp: 30 tablet, Rfl: 5   prasugrel  (EFFIENT ) 10 MG TABS tablet, Take 1 tablet (10 mg total) by mouth daily., Disp: 30 tablet, Rfl: 5   spironolactone  (ALDACTONE ) 25 MG tablet, Take 0.5 tablets (12.5 mg total) by mouth at bedtime., Disp: 30 tablet, Rfl: 5   XTANDI 40 MG capsule, Take 80 mg by mouth every evening., Disp: , Rfl:   Past Medical History: Past Medical History:  Diagnosis Date   Cellulitis of neck 12/13/2021   Headache    Migraines   Prostate cancer (HCC)    Skin cancer screening 12/13/2021    Tobacco Use: Social History   Tobacco Use  Smoking Status Some Days   Current packs/day: 0.00   Average packs/day: 0.5 packs/day for 40.0 years (20.0 ttl pk-yrs)   Types: Cigarettes   Start date: 09/15/1975   Last attempt to  quit: 09/15/2015   Years since quitting: 8.8   Passive exposure: Current  Smokeless Tobacco Never    Labs: Review Flowsheet  More data exists      Latest Ref Rng & Units 05/16/2023 11/08/2023 05/02/2024 05/03/2024 05/04/2024  Labs for ITP Cardiac and Pulmonary Rehab  Cholestrol 0 - 200 mg/dL 761  783  813  - -  LDL (calc) 0 - 99 mg/dL 840  859  880  - -  HDL-C >40 mg/dL 45  44  38  - -  Trlycerides <150 mg/dL 812  821  853  - -  Hemoglobin A1c 4.8 - 5.6 % 6.0  6.0  5.7  - -  PH, Arterial 7.35 - 7.45 - - 7.427  - -  PCO2 arterial 32 - 48 mmHg - - 28.8  - -  Bicarbonate 20.0 - 28.0 mmol/L 20.0 - 28.0 mmol/L - - 21.2  19.0  - -  TCO2 22 - 32 mmol/L 22 - 32 mmol/L - - 22  20  - -  Acid-base deficit 0.0 - 2.0 mmol/L 0.0 - 2.0 mmol/L - - 4.0  4.0  - -  O2 Saturation % - - 57.8  60.2  51  97  58.6  62.1  65.3  78.8  61     Details       Multiple values from one day are sorted in reverse-chronological order  Exercise Target Goals: Exercise Program Goal: Individual exercise prescription set using results from initial 6 min walk test and THRR while considering  patient's activity barriers and safety.   Exercise Prescription Goal: Initial exercise prescription builds to 30-45 minutes a day of aerobic activity, 2-3 days per week.  Home exercise guidelines will be given to patient during program as part of exercise prescription that the participant will acknowledge.   Education: Aerobic Exercise: - Group verbal and visual presentation on the components of exercise prescription. Introduces F.I.T.T principle from ACSM for exercise prescriptions.  Reviews F.I.T.T. principles of aerobic exercise including progression. Written material provided at class time.   Education: Resistance Exercise: - Group verbal and visual presentation on the components of exercise prescription. Introduces F.I.T.T principle from ACSM for exercise prescriptions  Reviews F.I.T.T. principles of resistance  exercise including progression. Written material provided at class time.    Education: Exercise & Equipment Safety: - Individual verbal instruction and demonstration of equipment use and safety with use of the equipment.   Education: Exercise Physiology & General Exercise Guidelines: - Group verbal and written instruction with models to review the exercise physiology of the cardiovascular system and associated critical values. Provides general exercise guidelines with specific guidelines to those with heart or lung disease. Written material provided at class time.   Education: Flexibility, Balance, Mind/Body Relaxation: - Group verbal and visual presentation with interactive activity on the components of exercise prescription. Introduces F.I.T.T principle from ACSM for exercise prescriptions. Reviews F.I.T.T. principles of flexibility and balance exercise training including progression. Also discusses the mind body connection.  Reviews various relaxation techniques to help reduce and manage stress (i.e. Deep breathing, progressive muscle relaxation, and visualization). Balance handout provided to take home. Written material provided at class time.   Activity Barriers & Risk Stratification:  Activity Barriers & Cardiac Risk Stratification - 05/20/24 1122       Activity Barriers & Cardiac Risk Stratification   Activity Barriers Other (comment);Decreased Ventricular Function    Comments Left hip pain/ spasm since hospitalization.    Cardiac Risk Stratification High          6 Minute Walk:  6 Minute Walk     Row Name 05/20/24 1230         6 Minute Walk   Phase Initial     Distance 971 feet     Walk Time 4.92 minutes     # of Rest Breaks 1  rested the last 1 minute 5 seconds     MPH 2.24     METS 2.57     RPE 9     Perceived Dyspnea  0     VO2 Peak 9     Symptoms Yes (comment)     Comments Left hip pain, 8/10 on pain scale. Chronic with walking since his hospitalization.      Resting HR 92 bpm     Resting BP 100/64     Resting Oxygen Saturation  100 %     Exercise Oxygen Saturation  during 6 min walk 100 %     Max Ex. HR 124 bpm     Max Ex. BP 120/76     2 Minute Post BP 100/64        Oxygen Initial Assessment:   Oxygen Re-Evaluation:   Oxygen Discharge (Final Oxygen Re-Evaluation):   Initial Exercise Prescription:  Initial Exercise Prescription - 05/20/24 1400       Date of Initial Exercise RX and Referring Provider  Date 05/20/24    Referring Provider Ladona Heinz, MD    Expected Discharge Date 08/13/24      Recumbant Bike   Level 1    RPM 30    Watts 17    Minutes 15    METs 2.6      NuStep   Level 1    SPM 85    Minutes 15    METs 2.6      Prescription Details   Frequency (times per week) 3    Duration Progress to 30 minutes of continuous aerobic without signs/symptoms of physical distress      Intensity   THRR 40-80% of Max Heartrate 61-122    Ratings of Perceived Exertion 11-13    Perceived Dyspnea 0-4      Progression   Progression Continue to progress workloads to maintain intensity without signs/symptoms of physical distress.      Resistance Training   Training Prescription Yes    Weight 4 lbs    Reps 10-15          Perform Capillary Blood Glucose checks as needed.  Exercise Prescription Changes:   Exercise Prescription Changes     Row Name 05/26/24 1037 06/02/24 1027 06/11/24 1025 06/16/24 1030 06/30/24 1037     Response to Exercise   Blood Pressure (Admit) 94/56 100/64 108/66 112/72 108/64   Blood Pressure (Exercise) 144/78 110/62 122/64 -- --   Blood Pressure (Exit) 97/68 90/60 92/64  118/58 100/70   Heart Rate (Admit) 97 bpm 92 bpm 94 bpm 81 bpm 85 bpm   Heart Rate (Exercise) 131 bpm 119 bpm 126 bpm 119 bpm 128 bpm   Heart Rate (Exit) 106 bpm 101 bpm 102 bpm 94 bpm 94 bpm   Rating of Perceived Exertion (Exercise) 13 8 11 11 11    Symptoms None None None None None   Comments Off to a good start with  exercise. Increased WL on recumbent stepper today. Reviewed home exercise guidelines, METs, and goals with Will. Missed average on recumbent bike --   Duration Continue with 30 min of aerobic exercise without signs/symptoms of physical distress. Continue with 30 min of aerobic exercise without signs/symptoms of physical distress. Continue with 30 min of aerobic exercise without signs/symptoms of physical distress. Continue with 30 min of aerobic exercise without signs/symptoms of physical distress. Continue with 30 min of aerobic exercise without signs/symptoms of physical distress.   Intensity THRR unchanged THRR unchanged THRR unchanged THRR unchanged THRR unchanged     Progression   Progression Continue to progress workloads to maintain intensity without signs/symptoms of physical distress. Continue to progress workloads to maintain intensity without signs/symptoms of physical distress. Continue to progress workloads to maintain intensity without signs/symptoms of physical distress. Continue to progress workloads to maintain intensity without signs/symptoms of physical distress. Continue to progress workloads to maintain intensity without signs/symptoms of physical distress.   Average METs 1.9 2.1 3.4 2.9 3.6     Resistance Training   Training Prescription Yes Yes No Yes Yes   Weight 4 lbs 4 lbs Relaxation day. No weights. 4 lbs 4 lbs   Reps 10-15 10-15 -- 10-15 10-15   Time 5 Minutes 5 Minutes -- 5 Minutes 5 Minutes     Interval Training   Interval Training No No No No No     Recumbant Bike   Level 1 2 2 2 3    RPM -- 77 89 -- 82   Watts 14 24 54 -- 46  Minutes 15 15 15 15 15    METs 2.1 2.3 3.8 -- 3.4     NuStep   Level 1 2 4 4 4    SPM 64 79 86 100 109   Minutes 15 15 15 15 15    METs 1.7 2 3.1 2.9 3.8     Home Exercise Plan   Plans to continue exercise at -- -- Home (comment)  Walking Home (comment)  Walking Home (comment)  Walking   Frequency -- -- Add 3 additional days to  program exercise sessions. Add 3 additional days to program exercise sessions. Add 3 additional days to program exercise sessions.   Initial Home Exercises Provided -- -- 06/11/24 06/11/24 06/11/24    Row Name 07/09/24 1027             Response to Exercise   Blood Pressure (Admit) 100/58       Blood Pressure (Exit) 104/60       Heart Rate (Admit) 74 bpm       Heart Rate (Exercise) 115 bpm       Heart Rate (Exit) 88 bpm       Rating of Perceived Exertion (Exercise) 11       Symptoms None       Comments Reviewed goals with Will.       Duration Continue with 30 min of aerobic exercise without signs/symptoms of physical distress.       Intensity THRR unchanged         Progression   Progression Continue to progress workloads to maintain intensity without signs/symptoms of physical distress.       Average METs 4         Resistance Training   Training Prescription No       Weight Relaxation day. No weights.         Interval Training   Interval Training No         Recumbant Bike   Level 3       RPM 64       Watts 53       Minutes 15       METs 3.8         NuStep   Level 4       SPM 116       Minutes 15       METs 4.3         Home Exercise Plan   Plans to continue exercise at Home (comment)  Walking       Frequency Add 3 additional days to program exercise sessions.       Initial Home Exercises Provided 06/11/24          Exercise Comments:   Exercise Comments     Row Name 05/26/24 1134 06/11/24 0959 07/09/24 1134       Exercise Comments Kapono tolerated low intensity exercise well without issues. Introduced him to the exercise equipment and stretching exercises. Reviewed home exercise guideline, METs, and goals with Will. MET level activity guide given. Reviewed goals with Will.        Exercise Goals and Review:   Exercise Goals     Row Name 05/20/24 1122             Exercise Goals   Increase Physical Activity Yes       Intervention Provide  advice, education, support and counseling about physical activity/exercise needs.;Develop an individualized exercise prescription for aerobic and resistive training based on initial evaluation findings,  risk stratification, comorbidities and participant's personal goals.       Expected Outcomes Short Term: Attend rehab on a regular basis to increase amount of physical activity.;Long Term: Exercising regularly at least 3-5 days a week.;Long Term: Add in home exercise to make exercise part of routine and to increase amount of physical activity.       Increase Strength and Stamina Yes       Intervention Provide advice, education, support and counseling about physical activity/exercise needs.;Develop an individualized exercise prescription for aerobic and resistive training based on initial evaluation findings, risk stratification, comorbidities and participant's personal goals.       Expected Outcomes Short Term: Increase workloads from initial exercise prescription for resistance, speed, and METs.;Short Term: Perform resistance training exercises routinely during rehab and add in resistance training at home;Long Term: Improve cardiorespiratory fitness, muscular endurance and strength as measured by increased METs and functional capacity ( )       Able to understand and use rate of perceived exertion (RPE) scale Yes       Intervention Provide education and explanation on how to use RPE scale       Expected Outcomes Short Term: Able to use RPE daily in rehab to express subjective intensity level;Long Term:  Able to use RPE to guide intensity level when exercising independently       Knowledge and understanding of Target Heart Rate Range (THRR) Yes       Intervention Provide education and explanation of THRR including how the numbers were predicted and where they are located for reference       Expected Outcomes Short Term: Able to state/look up THRR;Long Term: Able to use THRR to govern intensity when  exercising independently;Short Term: Able to use daily as guideline for intensity in rehab       Able to check pulse independently Yes       Intervention Provide education and demonstration on how to check pulse in carotid and radial arteries.;Review the importance of being able to check your own pulse for safety during independent exercise       Expected Outcomes Short Term: Able to explain why pulse checking is important during independent exercise;Long Term: Able to check pulse independently and accurately       Understanding of Exercise Prescription Yes       Intervention Provide education, explanation, and written materials on patient's individual exercise prescription       Expected Outcomes Short Term: Able to explain program exercise prescription;Long Term: Able to explain home exercise prescription to exercise independently          Exercise Goals Re-Evaluation :  Exercise Goals Re-Evaluation     Row Name 05/26/24 1134 06/11/24 0959 07/09/24 1134         Exercise Goal Re-Evaluation   Exercise Goals Review Increase Physical Activity;Increase Strength and Stamina;Able to understand and use rate of perceived exertion (RPE) scale Increase Physical Activity;Increase Strength and Stamina;Able to understand and use rate of perceived exertion (RPE) scale;Understanding of Exercise Prescription;Knowledge and understanding of Target Heart Rate Range (THRR) Increase Physical Activity;Increase Strength and Stamina;Able to understand and use rate of perceived exertion (RPE) scale;Understanding of Exercise Prescription;Knowledge and understanding of Target Heart Rate Range (THRR)     Comments Sabian was able to understand and use RPE scale appropriately. Reviewed exercise prescription and MET level progression with Will. He is walking at home in addition to exercise at cardiac rehab. Duval is exercising ath the gym T, Th, and  Sat. He's using the free weights (4 lbs) and weight machines (10lbs), 2-3  sets or 10-15 repetitions. He continues to progress well with exercise.     Expected Outcomes Progress workloads as tolerated to help improve cardiorespiratory fitness. Continue walking 3 days/week in addition to exercise at cardiac rehab. Continue to progress workloads as tolerated to help increase MET level. Continue exercise at the gym 3 days/week in addition to exercise at cardiac rehab.        Discharge Exercise Prescription (Final Exercise Prescription Changes):  Exercise Prescription Changes - 07/09/24 1027       Response to Exercise   Blood Pressure (Admit) 100/58    Blood Pressure (Exit) 104/60    Heart Rate (Admit) 74 bpm    Heart Rate (Exercise) 115 bpm    Heart Rate (Exit) 88 bpm    Rating of Perceived Exertion (Exercise) 11    Symptoms None    Comments Reviewed goals with Will.    Duration Continue with 30 min of aerobic exercise without signs/symptoms of physical distress.    Intensity THRR unchanged      Progression   Progression Continue to progress workloads to maintain intensity without signs/symptoms of physical distress.    Average METs 4      Resistance Training   Training Prescription No    Weight Relaxation day. No weights.      Interval Training   Interval Training No      Recumbant Bike   Level 3    RPM 64    Watts 53    Minutes 15    METs 3.8      NuStep   Level 4    SPM 116    Minutes 15    METs 4.3      Home Exercise Plan   Plans to continue exercise at Home (comment)   Walking   Frequency Add 3 additional days to program exercise sessions.    Initial Home Exercises Provided 06/11/24          Nutrition:  Target Goals: Understanding of nutrition guidelines, daily intake of sodium 1500mg , cholesterol 200mg , calories 30% from fat and 7% or less from saturated fats, daily to have 5 or more servings of fruits and vegetables.  Education: Nutrition 1 -Group instruction provided by verbal, written material, interactive activities,  discussions, models, and posters to present general guidelines for heart healthy nutrition including macronutrients, label reading, and promoting whole foods over processed counterparts. Education serves as pensions consultant of discussion of heart healthy eating for all. Written material provided at class time.    Education: Nutrition 2 -Group instruction provided by verbal, written material, interactive activities, discussions, models, and posters to present general guidelines for heart healthy nutrition including sodium, cholesterol, and saturated fat. Providing guidance of habit forming to improve blood pressure, cholesterol, and body weight. Written material provided at class time.     Biometrics:  Pre Biometrics - 05/20/24 1021       Pre Biometrics   Waist Circumference 43.5 inches    Hip Circumference 43 inches    Waist to Hip Ratio 1.01 %    Triceps Skinfold 18 mm    % Body Fat 29.9 %    Grip Strength 35 kg    Flexibility 10.63 in    Single Leg Stand 30 seconds           Nutrition Therapy Plan and Nutrition Goals:   Nutrition Assessments:  MEDIFICTS Score Key: >=70 Need to make dietary  changes  40-70 Heart Healthy Diet <= 40 Therapeutic Level Cholesterol Diet  Flowsheet Row INTENSIVE CARDIAC REHAB ORIENT from 05/20/2024 in [facility] for Heart, Vascular, & Lung Health  Picture Your Plate Total Score on Admission 81   Picture Your Plate Scores: <59 Unhealthy dietary pattern with much room for improvement. 41-50 Dietary pattern unlikely to meet recommendations for good health and room for improvement. 51-60 More healthful dietary pattern, with some room for improvement.  >60 Healthy dietary pattern, although there may be some specific behaviors that could be improved.    Nutrition Goals Re-Evaluation:   Nutrition Goals Discharge (Final Nutrition Goals Re-Evaluation):   Psychosocial: Target Goals: Acknowledge presence or absence of  significant depression and/or stress, maximize coping skills, provide positive support system. Participant is able to verbalize types and ability to use techniques and skills needed for reducing stress and depression.   Education: Stress, Anxiety, and Depression - Group verbal and visual presentation to define topics covered.  Reviews how body is impacted by stress, anxiety, and depression.  Also discusses healthy ways to reduce stress and to treat/manage anxiety and depression. Written material provided at class time.   Education: Sleep Hygiene -Provides group verbal and written instruction about how sleep can affect your health.  Define sleep hygiene, discuss sleep cycles and impact of sleep habits. Review good sleep hygiene tips.   Initial Review & Psychosocial Screening:  Initial Psych Review & Screening - 05/20/24 1056       Initial Review   Current issues with None Identified      Family Dynamics   Good Support System? Yes    Comments Great support from wife, 2 daughters, and 1 son. 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      Barriers   Psychosocial barriers to participate in program There are no identifiable barriers or psychosocial needs.      Screening Interventions   Interventions Encouraged to exercise    Expected Outcomes Short Term goal: Identification and review with participant of any Quality of Life or Depression concerns found by scoring the questionnaire.;Long Term goal: The participant improves quality of Life and PHQ9 Scores as seen by post scores and/or verbalization of changes          Quality of Life Scores:   Quality of Life - 05/20/24 1418       Quality of Life   Select Quality of Life      Quality of Life Scores   Health/Function Pre 16.6 %    Socioeconomic Pre 26.79 %    Psych/Spiritual Pre 21.36 %    Family Pre 22.6 %    GLOBAL Pre 20.56 %         Scores of 19 and below usually indicate a poorer quality of life in these  areas.  A difference of  2-3 points is a clinically meaningful difference.  A difference of 2-3 points in the total score of the Quality of Life Index has been associated with significant improvement in overall quality of life, self-image, physical symptoms, and general health in studies assessing change in quality of life.  PHQ-9: Review Flowsheet  More data exists      05/20/2024 11/15/2023 05/16/2023 11/14/2022 05/15/2022  Depression screen PHQ 2/9  Decreased Interest 0 0 0 0 0 0  Down, Depressed, Hopeless 0 0 0 0 0 0  PHQ - 2 Score 0 0 0 0 0 0  Altered sleeping 3 0 0 -  0  Tired, decreased energy 2 0 0 - 0  Change in appetite 0 0 0 - 0  Feeling bad or failure about yourself  0 0 0 - 0  Trouble concentrating 0 0 0 - 0  Moving slowly or fidgety/restless 0 0 0 - 0  Suicidal thoughts 0 0 0 - 0  PHQ-9 Score 5  0  0  - 0   Difficult doing work/chores Not difficult at all Not difficult at all Not difficult at all - Not difficult at all    Details       Data saved with a previous flowsheet row definition   Multiple values from one day are sorted in reverse-chronological order        Interpretation of Total Score  Total Score Depression Severity:  1-4 = Minimal depression, 5-9 = Mild depression, 10-14 = Moderate depression, 15-19 = Moderately severe depression, 20-27 = Severe depression   Psychosocial Evaluation and Intervention:   Psychosocial Re-Evaluation:  Psychosocial Re-Evaluation     Row Name 05/29/24 0904 06/13/24 1421 07/15/24 1606         Psychosocial Re-Evaluation   Current issues with None Identified Current Sleep Concerns None Identified     Comments -- Estel says he has frequent urination due to previous prostate surgery. Gloria says his energy level is improving. Aristeo has not had any increased concerns or stressors during exercise at cardiac rehab. Tyshaun hopes to be able to return to exercise in December.     Interventions Encouraged to attend Cardiac  Rehabilitation for the exercise Encouraged to attend Cardiac Rehabilitation for the exercise Encouraged to attend Cardiac Rehabilitation for the exercise     Continue Psychosocial Services  No Follow up required No Follow up required No Follow up required        Psychosocial Discharge (Final Psychosocial Re-Evaluation):  Psychosocial Re-Evaluation - 07/15/24 1606       Psychosocial Re-Evaluation   Current issues with None Identified    Comments Prentiss has not had any increased concerns or stressors during exercise at cardiac rehab. Alfio hopes to be able to return to exercise in December.    Interventions Encouraged to attend Cardiac Rehabilitation for the exercise    Continue Psychosocial Services  No Follow up required          Vocational Rehabilitation: Provide vocational rehab assistance to qualifying candidates.   Vocational Rehab Evaluation & Intervention:  Vocational Rehab - 05/20/24 1121       Initial Vocational Rehab Evaluation & Intervention   Assessment shows need for Vocational Rehabilitation No      Vocational Rehab Re-Evaulation   Comments Amarri is actively employed and plans to return to his previous job. No VR needs.          Education: Education Goals: Education classes will be provided on a variety of topics geared toward better understanding of heart health and risk factor modification. Participant will state understanding/return demonstration of topics presented as noted by education test scores.  Learning Barriers/Preferences:  Learning Barriers/Preferences - 05/20/24 1055       Learning Barriers/Preferences   Learning Barriers None    Learning Preferences Computer/Internet          General Cardiac Education Topics:  AED/CPR: - Group verbal and written instruction with the use of models to demonstrate the basic use of the AED with the basic ABC's of resuscitation.   Test and Procedures: - Group verbal and visual presentation and models  provide information about  basic cardiac anatomy and function. Reviews the testing methods done to diagnose heart disease and the outcomes of the test results. Describes the treatment choices: Medical Management, Angioplasty, or Coronary Bypass Surgery for treating various heart conditions including Myocardial Infarction, Angina, Valve Disease, and Cardiac Arrhythmias. Written material provided at class time.   Medication Safety: - Group verbal and visual instruction to review commonly prescribed medications for heart and lung disease. Reviews the medication, class of the drug, and side effects. Includes the steps to properly store meds and maintain the prescription regimen. Written material provided at class time.   Intimacy: - Group verbal instruction through game format to discuss how heart and lung disease can affect sexual intimacy. Written material provided at class time.   Know Your Numbers and Heart Failure: - Group verbal and visual instruction to discuss disease risk factors for cardiac and pulmonary disease and treatment options.  Reviews associated critical values for Overweight/Obesity, Hypertension, Cholesterol, and Diabetes.  Discusses basics of heart failure: signs/symptoms and treatments.  Introduces Heart Failure Zone chart for action plan for heart failure. Written material provided at class time.   Infection Prevention: - Provides verbal and written material to individual with discussion of infection control including proper hand washing and proper equipment cleaning during exercise session.   Falls Prevention: - Provides verbal and written material to individual with discussion of falls prevention and safety.   Other: -Provides group and verbal instruction on various topics (see comments)   Knowledge Questionnaire Score:  Knowledge Questionnaire Score - 05/20/24 1419       Knowledge Questionnaire Score   Pre Score 24/24          Core Components/Risk  Factors/Patient Goals at Admission:  Personal Goals and Risk Factors at Admission - 05/20/24 1122       Core Components/Risk Factors/Patient Goals on Admission   Heart Failure Yes    Intervention Provide a combined exercise and nutrition program that is supplemented with education, support and counseling about heart failure. Directed toward relieving symptoms such as shortness of breath, decreased exercise tolerance, and extremity edema.    Expected Outcomes Improve functional capacity of life;Short term: Attendance in program 2-3 days a week with increased exercise capacity. Reported lower sodium intake. Reported increased fruit and vegetable intake. Reports medication compliance.;Short term: Daily weights obtained and reported for increase. Utilizing diuretic protocols set by physician.;Long term: Adoption of self-care skills and reduction of barriers for early signs and symptoms recognition and intervention leading to self-care maintenance.    Lipids Yes    Intervention Provide education and support for participant on nutrition & aerobic/resistive exercise along with prescribed medications to achieve LDL 70mg , HDL >40mg .    Expected Outcomes Short Term: Participant states understanding of desired cholesterol values and is compliant with medications prescribed. Participant is following exercise prescription and nutrition guidelines.;Long Term: Cholesterol controlled with medications as prescribed, with individualized exercise RX and with personalized nutrition plan. Value goals: LDL < 70mg , HDL > 40 mg.    Personal Goal Other Yes    Personal Goal Increase daily physical activity level. Improve results on all blood panels. Increase heart strength to acceptable levels to ensure self-sufficiency.    Intervention Develop and individualized exercise action plan that Jesiah can do at home to help improve cardiac function and ability to remain self-sufficient. Develop a nutrition action plan that Malikye can  follow to help with lipid level and other pertinent blood panels.    Expected Outcomes Dawan will comply with and  maintain exercise and nutrition action plan to help improve cardiac function and overall health.          Education:Diabetes - Individual verbal and written instruction to review signs/symptoms of diabetes, desired ranges of glucose level fasting, after meals and with exercise. Acknowledge that pre and post exercise glucose checks will be done for 3 sessions at entry of program.   Core Components/Risk Factors/Patient Goals Review:   Goals and Risk Factor Review     Row Name 05/29/24 0908 06/13/24 1441 07/15/24 1608         Core Components/Risk Factors/Patient Goals Review   Personal Goals Review Weight Management/Obesity;Heart Failure;Lipids;Stress Weight Management/Obesity;Heart Failure;Lipids;Stress Weight Management/Obesity;Heart Failure;Lipids;Stress     Review Amiri started cardiac rehab on 05/26/24. Antionne did well with exercise for his fitness level.Marion started physical therapy for chronic hip pain. Jonerik started cardiac rehab on 05/26/24. Kohlton is off to a good with exercise. Solace has increased his met levels. Resting systolic BP's have been in the 90's asymptomatic. Will forward to [facility] NP. Dequincy is doing well with exercise. Tomoya has increased his met levels.  Vital signs have been stable.     Expected Outcomes Zackarey will continue to participate in cardiac rehab for exercise, nutrition and lifestyle modifications Kemper will continue to participate in cardiac rehab for exercise, nutrition and lifestyle modifications Anias will continue to participate in cardiac rehab for exercise, nutrition and lifestyle modifications        Core Components/Risk Factors/Patient Goals at Discharge (Final Review):   Goals and Risk Factor Review - 07/15/24 1608       Core Components/Risk Factors/Patient Goals Review   Personal Goals Review Weight  Management/Obesity;Heart Failure;Lipids;Stress    Review Zayyan is doing well with exercise. Hilary has increased his met levels.  Vital signs have been stable.    Expected Outcomes Masson will continue to participate in cardiac rehab for exercise, nutrition and lifestyle modifications          ITP Comments:  ITP Comments     Row Name 05/20/24 1021 05/29/24 0903 06/13/24 1419 07/15/24 1605     ITP Comments Medical Director- Dr. Wilbert Bihari, MD. Introduction to the [facility] Education/ Intensive Cardiac Rehab Program. Reviewed initial orientation folder with Will. 30 Day ITP Review. Alexa started cardiac rehab on 05/26/24. Kaled did well with exercise. 30 Day ITP Review. Montrey started cardiac rehab on 05/26/24. Leanthony is off to a good start with exercise. 30 Day ITP Review. Tamarion has good attendance and participation with exercise at cardiac rehab.       Comments: See ITP Comments

## 2024-07-16 ENCOUNTER — Encounter (HOSPITAL_COMMUNITY): Admitting: Cardiology

## 2024-07-16 ENCOUNTER — Other Ambulatory Visit (HOSPITAL_COMMUNITY)

## 2024-07-16 ENCOUNTER — Encounter (HOSPITAL_COMMUNITY): Payer: Self-pay

## 2024-07-16 ENCOUNTER — Ambulatory Visit (HOSPITAL_COMMUNITY)
Admission: RE | Admit: 2024-07-16 | Discharge: 2024-07-16 | Disposition: A | Source: Ambulatory Visit | Attending: *Deleted | Admitting: *Deleted

## 2024-07-16 ENCOUNTER — Encounter (HOSPITAL_COMMUNITY)
Admission: RE | Admit: 2024-07-16 | Discharge: 2024-07-16 | Disposition: A | Discharge status: Home / Self Care | Source: Ambulatory Visit | Attending: Cardiology | Admitting: Cardiology

## 2024-07-16 VITALS — BP 112/78 | HR 88 | Ht 71.0 in | Wt 202.4 lb

## 2024-07-16 DIAGNOSIS — Z955 Presence of coronary angioplasty implant and graft: Secondary | ICD-10-CM | POA: Insufficient documentation

## 2024-07-16 DIAGNOSIS — Z87891 Personal history of nicotine dependence: Secondary | ICD-10-CM | POA: Diagnosis not present

## 2024-07-16 DIAGNOSIS — I513 Intracardiac thrombosis, not elsewhere classified: Secondary | ICD-10-CM

## 2024-07-16 DIAGNOSIS — I5022 Chronic systolic (congestive) heart failure: Secondary | ICD-10-CM | POA: Diagnosis present

## 2024-07-16 DIAGNOSIS — E785 Hyperlipidemia, unspecified: Secondary | ICD-10-CM

## 2024-07-16 DIAGNOSIS — Z7902 Long term (current) use of antithrombotics/antiplatelets: Secondary | ICD-10-CM | POA: Diagnosis not present

## 2024-07-16 DIAGNOSIS — I3139 Other pericardial effusion (noninflammatory): Secondary | ICD-10-CM | POA: Diagnosis not present

## 2024-07-16 DIAGNOSIS — Z9079 Acquired absence of other genital organ(s): Secondary | ICD-10-CM | POA: Insufficient documentation

## 2024-07-16 DIAGNOSIS — Z8546 Personal history of malignant neoplasm of prostate: Secondary | ICD-10-CM | POA: Insufficient documentation

## 2024-07-16 DIAGNOSIS — I213 ST elevation (STEMI) myocardial infarction of unspecified site: Secondary | ICD-10-CM | POA: Diagnosis not present

## 2024-07-16 DIAGNOSIS — Z79899 Other long term (current) drug therapy: Secondary | ICD-10-CM | POA: Diagnosis not present

## 2024-07-16 DIAGNOSIS — K649 Unspecified hemorrhoids: Secondary | ICD-10-CM | POA: Insufficient documentation

## 2024-07-16 DIAGNOSIS — I2510 Atherosclerotic heart disease of native coronary artery without angina pectoris: Secondary | ICD-10-CM

## 2024-07-16 DIAGNOSIS — Z720 Tobacco use: Secondary | ICD-10-CM

## 2024-07-16 DIAGNOSIS — K625 Hemorrhage of anus and rectum: Secondary | ICD-10-CM

## 2024-07-16 DIAGNOSIS — I252 Old myocardial infarction: Secondary | ICD-10-CM | POA: Insufficient documentation

## 2024-07-16 DIAGNOSIS — I255 Ischemic cardiomyopathy: Secondary | ICD-10-CM | POA: Insufficient documentation

## 2024-07-16 DIAGNOSIS — C61 Malignant neoplasm of prostate: Secondary | ICD-10-CM

## 2024-07-16 LAB — BASIC METABOLIC PANEL WITH GFR
Anion gap: 8 (ref 5–15)
BUN: 26 mg/dL — ABNORMAL HIGH (ref 8–23)
CO2: 26 mmol/L (ref 22–32)
Calcium: 9.3 mg/dL (ref 8.9–10.3)
Chloride: 100 mmol/L (ref 98–111)
Creatinine, Ser: 0.91 mg/dL (ref 0.61–1.24)
GFR, Estimated: >60 mL/min (ref >60)
Glucose, Bld: 95 mg/dL (ref 70–99)
Potassium: 4.4 mmol/L (ref 3.5–5.1)
Sodium: 134 mmol/L — ABNORMAL LOW (ref 135–145)

## 2024-07-16 LAB — CBC
HCT: 44.3 % (ref 39.0–52.0)
Hemoglobin: 14.6 g/dL (ref 13.0–17.0)
MCH: 30.4 pg (ref 26.0–34.0)
MCHC: 33 g/dL (ref 30.0–36.0)
MCV: 92.3 fL (ref 80.0–100.0)
Platelets: 355 K/uL (ref 150–400)
RBC: 4.8 MIL/uL (ref 4.22–5.81)
RDW: 13 % (ref 11.5–15.5)
WBC: 7.8 K/uL (ref 4.0–10.5)
nRBC: 0 % (ref 0.0–0.2)

## 2024-07-16 LAB — ECHOCARDIOGRAM COMPLETE
AR max vel: 3.31 cm2
AV Area VTI: 3.42 cm2
AV Area mean vel: 3.53 cm2
AV Mean grad: 2 mmHg
AV Peak grad: 4.5 mmHg
Ao pk vel: 1.06 m/s
Area-P 1/2: 5.2 cm2
Calc EF: 32.3 %
MV VTI: 4.52 cm2
S' Lateral: 4.5 cm
Single Plane A2C EF: 35 %
Single Plane A4C EF: 30.7 %

## 2024-07-16 LAB — FERRITIN: Ferritin: 38 ng/mL (ref 24–336)

## 2024-07-16 LAB — IRON AND TIBC
Iron: 91 ug/dL (ref 45–182)
Saturation Ratios: 22 % (ref 17.9–39.5)
TIBC: 406 ug/dL (ref 250–450)
UIBC: 315 ug/dL

## 2024-07-16 MED ORDER — LOSARTAN POTASSIUM 25 MG PO TABS: 12.5000 mg | ORAL_TABLET | Freq: Every day | ORAL | 3 refills | Status: AC

## 2024-07-16 MED ORDER — DABIGATRAN ETEXILATE MESYLATE 150 MG PO CAPS: 150.0000 mg | ORAL_CAPSULE | Freq: Two times a day (BID) | ORAL | 2 refills | Status: AC

## 2024-07-16 MED ORDER — PERFLUTREN LIPID MICROSPHERE: 1.0000 mL | INTRAVENOUS | Status: DC | PRN

## 2024-07-16 NOTE — Progress Notes (Signed)
  Echocardiogram 2D Echocardiogram has been performed.  [name] 07/16/2024, 1:22 PM

## 2024-07-16 NOTE — Patient Instructions (Addendum)
 Good to see you today!  START losartan 12.5 mg( 1/2 tablet) daily  STOP aspirin   START pradaxa  150 mg Twice daily  You have been referred to electrophysiology  Labs done today, your results will be available in MyChart, we will contact you for abnormal readings.  Bring paperwork to next appointment  Your physician recommends that you schedule a follow-up appointment as scheduled  If you have any questions or concerns before your next appointment please send us  a message through [facility] or call our office at [phone].    TO LEAVE A MESSAGE FOR THE NURSE SELECT OPTION 2, PLEASE LEAVE A MESSAGE INCLUDING: YOUR NAME DATE OF BIRTH CALL BACK NUMBER REASON FOR CALL**this is important as we prioritize the call backs  YOU WILL RECEIVE A CALL BACK THE SAME DAY AS LONG AS YOU CALL BEFORE 4:00 PM At the [facility], you and your health needs are our priority. As part of our continuing mission to provide you with exceptional heart care, we have created designated Provider Care Teams. These Care Teams include your primary Cardiologist (physician) and Advanced Practice Providers (APPs- Physician Assistants and Nurse Practitioners) who all work together to provide you with the care you need, when you need it.   You may see any of the following providers on your designated Care Team at your next follow up: Dr Toribio Fuel Dr Ezra Shuck Dr. Morene Brownie Greig Mosses, NP Caffie Shed, [place] [facility] [place], [place] Beckey Coe, NP Jordan Lee, NP Ellouise Class, NP Tinnie Redman, PharmD Jaun Bash, PharmD   Please be sure to bring in all your medications bottles to every appointment.    Thank you for choosing [facility]

## 2024-07-16 NOTE — Progress Notes (Signed)
 ADVANCED HF CLINIC NOTE  Primary Care: [place], Quintin PARAS, MD Primary Cardiologist: Dr. Ladona HF Cardiologist: Dr. Zenaida  HPI: Andre Cowan is a 68 y.o. male with history of prostate cancer s/p prostatectomy, tobacco use, HLD prediabetes, and newly diagnosed CAD and HFrEF.    Admitted 9/25 with anterior STEMI. RHC findings significant for and elevated biventricular filling pressures and reduced cardiac index. Bedside echo showed hypokinesis to akinesis of the LV apex and LAD territory with some concern for possible LAD thrombus. Decision made to proceed with IABP support. IABP and pressor support weaned off. GDMT limited with soft BP. Plan to add further GDMT OP.  Discharge echo with no definite clot seen, decision made to stop Eliquis  (as it interacted with Xtandi) and keep on ASA and Effient .  Today he returns for HF follow up. Overall feeling fine. He hasmild SOB with physical activity at CR, otherwise no undue SOB. He has hemorrhoidal bleeding with BMs, worse since being placed on antiplatelet post PCI. Denies palpitations, CP, dizziness, edema, or PND/Orthopnea. Appetite ok. Weight at home 195 pounds. Taking all medications. Quit smoking before admission, no tobacco or ETOH use. Works as scientist, physiological. Eager to go back to work.  Echo today EF 25%, + LV thrombus. Dr Rolan reviewed images.  Past Medical History:  Diagnosis Date   Cellulitis of neck 12/13/2021   Headache    Migraines   Prostate cancer (HCC)    Skin cancer screening 12/13/2021   Current Outpatient Medications  Medication Sig Dispense Refill   aspirin  81 MG chewable tablet Chew 1 tablet (81 mg total) by mouth daily. 30 tablet 5   atorvastatin  (LIPITOR ) 80 MG tablet Take 1 tablet (80 mg total) by mouth daily. 30 tablet 5   empagliflozin  (JARDIANCE ) 10 MG TABS tablet Take 1 tablet (10 mg total) by mouth daily before breakfast. 30 tablet 5   metoprolol  succinate (TOPROL  XL) 25 MG 24 hr tablet Take 0.5 tablets  (12.5 mg total) by mouth daily. 45 tablet 1   pantoprazole  (PROTONIX ) 40 MG tablet Take 1 tablet (40 mg total) by mouth daily. 30 tablet 5   prasugrel  (EFFIENT ) 10 MG TABS tablet Take 1 tablet (10 mg total) by mouth daily. 30 tablet 5   spironolactone  (ALDACTONE ) 25 MG tablet Take 0.5 tablets (12.5 mg total) by mouth at bedtime. 30 tablet 5   XTANDI 40 MG capsule Take 80 mg by mouth every evening.     No current facility-administered medications for this encounter.   Facility-Administered Medications Ordered in Other Encounters  Medication Dose Route Frequency Provider Last Rate Last Admin   perflutren  lipid microspheres (DEFINITY ) IV suspension  1-10 mL Intravenous PRN Bensimhon, Toribio SAUNDERS, MD       No Known Allergies  Social History   Socioeconomic History   Marital status: Married    Spouse name: Not on file   Number of children: 3   Years of education: Not on file   Highest education level: Bachelor's degree (e.g., BA, AB, BS)  Occupational History    Comment: working full time   Tobacco Use   Smoking status: Some Days    Current packs/day: 0.00    Average packs/day: 0.5 packs/day for 40.0 years (20.0 ttl pk-yrs)    Types: Cigarettes    Start date: 09/15/1975    Last attempt to quit: 09/15/2015    Years since quitting: 8.8    Passive exposure: Current   Smokeless tobacco: Never  Vaping Use  Vaping status: Never Used  Substance and Sexual Activity   Alcohol use: Never   Drug use: Never   Sexual activity: Not Currently  Other Topics Concern   Not on file  Social History Narrative   Not on file   Social Drivers of Health   Financial Resource Strain: Low Risk  (05/21/2024)   Overall Financial Resource Strain (CARDIA)    Difficulty of Paying Living Expenses: Not very hard  Food Insecurity: No Food Insecurity (05/21/2024)   Hunger Vital Sign    Worried About Running Out of Food in the Last Year: Never true    Ran Out of Food in the Last Year: Never true  Transportation  Needs: No Transportation Needs (05/21/2024)   PRAPARE - Administrator, Civil Service (Medical): No    Lack of Transportation (Non-Medical): No  Physical Activity: Insufficiently Active (05/21/2024)   Exercise Vital Sign    Days of Exercise per Week: 2 days    Minutes of Exercise per Session: 10 min  Stress: No Stress Concern Present (05/21/2024)   [employer] of Occupational Health - Occupational Stress Questionnaire    Feeling of Stress: Not at all  Social Connections: Moderately Isolated (05/21/2024)   Social Connection and Isolation Panel    Frequency of Communication with Friends and Family: More than three times a week    Frequency of Social Gatherings with Friends and Family: More than three times a week    Attends Religious Services: Patient declined    Database Administrator or Organizations: No    Attends Engineer, Structural: Not on file    Marital Status: Married  Catering Manager Violence: Not At Risk (05/02/2024)   Humiliation, Afraid, Rape, and Kick questionnaire    Fear of Current or Ex-Partner: No    Emotionally Abused: No    Physically Abused: No    Sexually Abused: No   Family History  Problem Relation Age of Onset   Hypertension Father    Heart disease Father    Heart attack Father    Prostate cancer Neg Hx    Breast cancer Neg Hx    Colon cancer Neg Hx    Pancreatic cancer Neg Hx    Wt Readings from Last 3 Encounters:  07/16/24 91.8 kg (202 lb 6.4 oz)  06/09/24 91.8 kg (202 lb 6.4 oz)  06/05/24 91.6 kg (202 lb)   BP 112/78   Pulse 88   Ht 5' 11 (1.803 m)   Wt 91.8 kg (202 lb 6.4 oz)   SpO2 100%   BMI 28.23 kg/m   PHYSICAL EXAM: General:  NAD. No resp difficulty, walked into clinic HEENT: Normal Neck: Supple. No JVD. Cor: Regular rate & rhythm. No rubs, gallops or murmurs. Lungs: Clear Abdomen: Soft, nontender, nondistended.  Extremities: No cyanosis, clubbing, rash, edema Neuro: Alert & oriented x 3, moves all 4  extremities w/o difficulty. Affect pleasant.  ASSESSMENT & PLAN: Chronic HFrEF - iCM - Echo 05/02/24: EF 25%, LV with RWMA, RV normal. Small pericardial effusion present.  - Repeat echo 05/05/2024: EF 25%.  No thrombus seen but is at risk with sludging of flow at the apex. - Echo today 07/16/24, EF remains 25%, + clot (see below) - NYHA I, volume OK.  - Start losartan 12.5 mg daily. BP too low for Entresto - Continue spiro 12.5 mg at bedtime. - Continue Toprol  XL 12.5 mg daily. - Continue Jardiance  10 mg daily. - Labs today, repeat BMET at  next lab draw. - Continue Cardiac Rehab - Refer to EP for ICD consideration as EF remains < 35% . Narrow QRS so no CRT  LV thrombus - Echo today 07/16/24 shows + LV thrombus - With his recent PCI, ideally would stop ASA + Effient , and start Plavix and Eliquis . - However, there is significant interaction Plavix and Xtandi, and he cannot stop his Xtandi - Therefore, will stop ASA and start Pradaxa 150 mg bid, continue Effient . Will need to watch for bleeding. Discussed increased bleeding risk with patient today. - Check baseline CBC and iron panel today. Discussed with PharmD and Dr Rolan. Will need repeat CBC at close follow up  CAD - LHC occluded p LAD treated with PCI/DES. Residual disease in RCA and LCX - S/P PCI to the LAD on by Dr. Ladona on 05/02/24.  - No chest pain - Stop ASA and as we are starting Pradaxa  - Continue Effient  as above - Continue CR   Hyperlipidemia - Continue lipitor  80 mg daily.  - management per Gen Cards   Prostate cancer  - S/p prostatectomy - On Xtandi   Tobacco use - Remains quit since admission - Congratulated.  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 Zenaida next week, as scheduled.  I asked Andre Cowan to bring his RTW paperwork to  next visit and we will be happy to sign.  Harlene HERO [facility] FNP-BC  07/16/24

## 2024-07-17 ENCOUNTER — Encounter

## 2024-07-17 ENCOUNTER — Ambulatory Visit: Attending: Cardiology | Admitting: Cardiology

## 2024-07-17 ENCOUNTER — Encounter: Payer: Self-pay | Admitting: Cardiology

## 2024-07-17 VITALS — BP 137/75 | HR 81 | Ht 71.0 in | Wt 204.0 lb

## 2024-07-17 DIAGNOSIS — I255 Ischemic cardiomyopathy: Secondary | ICD-10-CM | POA: Diagnosis not present

## 2024-07-17 DIAGNOSIS — I236 Thrombosis of atrium, auricular appendage, and ventricle as current complications following acute myocardial infarction: Secondary | ICD-10-CM | POA: Diagnosis not present

## 2024-07-17 DIAGNOSIS — I5022 Chronic systolic (congestive) heart failure: Secondary | ICD-10-CM | POA: Diagnosis not present

## 2024-07-17 DIAGNOSIS — I2510 Atherosclerotic heart disease of native coronary artery without angina pectoris: Secondary | ICD-10-CM

## 2024-07-17 NOTE — Patient Instructions (Addendum)
 Medication Instructions:  Your physician recommends that you continue on your current medications as directed. Please refer to the Current Medication list given to you today.  *If you need a refill on your cardiac medications before your next appointment, please call your pharmacy*  Follow-Up: At [facility], you and your health needs are our priority.  As part of our continuing mission to provide you with exceptional heart care, our providers are all part of one team.  This team includes your primary Cardiologist (physician) and Advanced Practice Providers or APPs (Physician Assistants and Nurse Practitioners) who all work together to provide you with the care you need, when you need it.  Your next appointment:   January 20th  Provider:   Fonda Kitty, MD

## 2024-07-17 NOTE — Progress Notes (Addendum)
 Electrophysiology Office Note:   Date:  07/20/2024  ID:  Andre Cowan, DOB 1955-12-16, MRN [id number]  Primary Cardiologist: None Electrophysiologist: Fonda Kitty, MD      History of Present Illness:   Andre Cowan is a 67 y.o. male with h/o prostate cancer s/p prostatectomy, tobacco use, HLD prediabetes, CAD and chronic systolic heart failure who is being seen today for ICD evaluation.  Discussed the use of AI scribe software for clinical note transcription with the patient, who gave verbal consent to proceed.  History of Present Illness Andre Cowan is a 68 year old male with heart failure and myocardial infarction who presents for discussion regarding a defibrillator. He was referred by the heart failure team to discuss the potential need for a defibrillator.  He understands that his heart function is at 35% or less, and he has been told this increases the risk of sudden cardiac death due to life-threatening heart rhythms. He is concerned about the risk of his LV thrombus dislodging and causing a stroke. He is on Pradaxa  to help dissolve the clot.  He experienced a heart attack in September, which resulted in significant heart muscle damage and scarring. Since the heart attack, he feels there has been no improvement in his condition, and he remains at the same level as when he was hospitalized.  He reports some shortness of breath, which is not consistent and sometimes occurs during exertion, such as going up and down stairs. No chest pain since the heart attack and no swelling in his ankles at present.  He is currently on blood thinners to manage the clot in his heart.   Review of systems complete and found to be negative unless listed in HPI.   EP Information / Studies Reviewed:    EKG is not ordered today. EKG from 05/12/24 reviewed which showed SR with PR and QRS 98ms.       Echo 12/43/25:  1. Large, adherent LV thrombus noted in the apex. Team aware. Left   ventricular ejection fraction, by estimation, is 25 to 30%. The left  ventricle has severely decreased function. The left ventricle demonstrates  regional wall motion abnormalities  consistent with large LAD infarct, developing aneurysmal apex. Left  ventricular diastolic parameters are consistent with Grade I diastolic  dysfunction (impaired relaxation).   2. Right ventricular systolic function is normal. The right ventricular  size is normal.   3. The mitral valve is normal in structure. No evidence of mitral valve  regurgitation. No evidence of mitral stenosis.   4. The aortic valve is tricuspid. Aortic valve regurgitation is not  visualized. No aortic stenosis is present.   5. The inferior vena cava is normal in size with greater than 50%  respiratory variability, suggesting right atrial pressure of 3 mmHg.   LHC 05/02/24: Angiographic data: LV: Entire anterolateral wall akinetic, EF 25 to 30%. LM: Large-caliber vessel and mildly calcified. LAD: Gives origin to a moderate-sized D1 and a small to moderate-sized D2, proximal LAD is occluded, it is moderately calcified.  Mid to distal LAD has a 30% stenosis. RI: Moderate caliber vessel with a high-grade tandem 80 to 95% stenosis in the midsegment. Cx: Large-caliber vessel and a dominant vessel.  Gives origin to large OM1 which is tandem 80 to 90% stenosis after the origin of OM1, mid Cx has a eccentric 70% stenosis.  Gives origin to moderate-sized PL branch distally and a large RPDA which are disease-free. RCA: Nondominant.  Relatively large vessel.  Mid segment  has a focal 90% stenosis followed by diffuse 80% long segment stenosis.   Intervention data: Successful PTCA and stenting of the proximal LAD with implantation of a 3.5 x 38 mm Synergy XD DES, stenosis reduced from 100% to 0% with TIMI 0 to TIMI-3 flow.  No sidebranch compromise. Due to markedly elevated EDP, need for pressor support, right heart catheterization was performed and  noted to have low cardiac index and surprisingly PAPi was also low bedside echo revealing normal RV function.  Hence decided to proceed with placement of IABP, patient was able to wean off of norepinephrine  at the end of the procedure with stable hemodynamics.   Physical Exam:   VS:  BP 137/75 (BP Location: Left Arm, Patient Position: Sitting, Cuff Size: Large)   Pulse 81   Ht 5' 11 (1.803 m)   Wt 204 lb (92.5 kg)   SpO2 95%   BMI 28.45 kg/m    Wt Readings from Last 3 Encounters:  07/17/24 204 lb (92.5 kg)  07/16/24 202 lb 6.4 oz (91.8 kg)  06/09/24 202 lb 6.4 oz (91.8 kg)     General: Well developed, in no acute distress.  Neck: No JVD.  Cardiac: Normal rate, regular rhythm.  Resp: Normal work of breathing.  Ext: No edema.  Neuro: No gross focal deficits.  Psych: Normal affect.   ASSESSMENT AND PLAN:    #Chronic systolic heart failure: NYHA class II #Ischemic cardiomyopathy #LV thrombus - Patient meets criteria for primary prevention ICD in setting of LVEF less than 35% despite almost 90 days of optimal medical therapy post revascularization.  Patient is very concerned about his LV thrombus and does not want to pursue any procedures until this issue has resolved.  I think this is reasonable as interruption of his anticoagulation for ICD implant would be high risk.  He is currently on Pradaxa  for management of this.  Recommend repeat imaging in 1 month to assess for resolution of his thrombus.  If thrombus is gone, then proceed with ICD implantation.  I will communicate this to Dr. Zenaida and see if he agrees. - Continue GDMT regimen of empagliflozin , losartan , metoprolol  XL, spironolactone . - Continue Pradaxa  for LV thrombus.  Managed by HF team.    #CAD: LHC occluded pLAD treated with PCI/DES. Residual disease in RCA and LCX.  Denies chest pain. - Not on aspirin  due to Pradaxa .  Continue prasugrel .  Continue atorvastatin .  Managed by interventional cardiology, Dr.  Ladona.  Follow up with Dr. Kennyth in 4 weeks  Signed, Fonda Kennyth, MD

## 2024-07-18 ENCOUNTER — Encounter (HOSPITAL_COMMUNITY)
Admission: RE | Admit: 2024-07-18 | Discharge: 2024-07-18 | Disposition: A | Source: Ambulatory Visit | Attending: Cardiology

## 2024-07-18 DIAGNOSIS — I5022 Chronic systolic (congestive) heart failure: Secondary | ICD-10-CM | POA: Diagnosis not present

## 2024-07-18 DIAGNOSIS — I213 ST elevation (STEMI) myocardial infarction of unspecified site: Secondary | ICD-10-CM

## 2024-07-18 DIAGNOSIS — Z955 Presence of coronary angioplasty implant and graft: Secondary | ICD-10-CM | POA: Diagnosis not present

## 2024-07-21 ENCOUNTER — Ambulatory Visit (HOSPITAL_COMMUNITY): Payer: Self-pay | Admitting: Family Medicine

## 2024-07-21 ENCOUNTER — Encounter (HOSPITAL_COMMUNITY)

## 2024-07-23 ENCOUNTER — Encounter (HOSPITAL_COMMUNITY): Payer: Self-pay | Admitting: Cardiology

## 2024-07-23 ENCOUNTER — Ambulatory Visit (HOSPITAL_COMMUNITY)
Admission: RE | Admit: 2024-07-23 | Discharge: 2024-07-23 | Disposition: A | Discharge status: Home / Self Care | Source: Ambulatory Visit | Attending: Cardiology | Admitting: Cardiology

## 2024-07-23 ENCOUNTER — Encounter (HOSPITAL_COMMUNITY)
Admission: RE | Admit: 2024-07-23 | Discharge: 2024-07-23 | Disposition: A | Discharge status: Home / Self Care | Source: Ambulatory Visit | Attending: Cardiology | Admitting: Cardiology

## 2024-07-23 VITALS — BP 100/64 | HR 78 | Ht 71.0 in | Wt 206.6 lb

## 2024-07-23 DIAGNOSIS — I5022 Chronic systolic (congestive) heart failure: Secondary | ICD-10-CM | POA: Insufficient documentation

## 2024-07-23 DIAGNOSIS — I252 Old myocardial infarction: Secondary | ICD-10-CM | POA: Insufficient documentation

## 2024-07-23 DIAGNOSIS — Z7901 Long term (current) use of anticoagulants: Secondary | ICD-10-CM | POA: Insufficient documentation

## 2024-07-23 DIAGNOSIS — I2510 Atherosclerotic heart disease of native coronary artery without angina pectoris: Secondary | ICD-10-CM | POA: Insufficient documentation

## 2024-07-23 DIAGNOSIS — I2102 ST elevation (STEMI) myocardial infarction involving left anterior descending coronary artery: Secondary | ICD-10-CM

## 2024-07-23 DIAGNOSIS — Z955 Presence of coronary angioplasty implant and graft: Secondary | ICD-10-CM

## 2024-07-23 DIAGNOSIS — I213 ST elevation (STEMI) myocardial infarction of unspecified site: Secondary | ICD-10-CM

## 2024-07-23 DIAGNOSIS — Z79899 Other long term (current) drug therapy: Secondary | ICD-10-CM | POA: Insufficient documentation

## 2024-07-23 DIAGNOSIS — C61 Malignant neoplasm of prostate: Secondary | ICD-10-CM | POA: Insufficient documentation

## 2024-07-23 NOTE — Progress Notes (Signed)
° °  ADVANCED HEART FAILURE FOLLOW UP CLINIC NOTE  Referring Physician: de [place], Raymond J, MD  Primary Care: de [place], Quintin PARAS, MD Primary Cardiologist:  HPI: Andre Cowan is a 68 y.o. male who presents for follow up of chronic systolic heart failure..      Admitted 9/25 with anterior STEMI. RHC findings significant for and elevated biventricular filling pressures and reduced cardiac index. Bedside echo showed hypokinesis to akinesis of the LV apex and LAD territory with some concern for possible LAD thrombus. Decision made to proceed with IABP support. IABP and pressor support weaned off. GDMT limited with soft BP. Plan to add further GDMT OP. Discharge echo with no definite clot seen, decision made to stop Eliquis  (as it interacted with Xtandi) and keep on ASA and Effient .   Persistently decreased LVEF with LV thrombus seen on last echo. Placed back on pradaxa  and effient .      SUBJECTIVE:  Patient overall doing fair, is concerned about his recent LV thrombus. He has been taking his medications as scheduled. Discussed the risk/benefit of ICD, given multiple LV thrombus will repeat echo in the next 2-3 months.   PMH, current medications, allergies, social history, and family history reviewed in epic.  PHYSICAL EXAM: Vitals:   07/23/24 1435  BP: 100/64  Pulse: 78  SpO2: 98%   GENERAL: Well nourished and in no apparent distress at rest.  PULM:  Normal work of breathing, clear to auscultation bilaterally. Respirations are unlabored.  CARDIAC:  JVP: flat         Normal rate with regular rhythm. No murmurs, rubs or gallops.  No edema. Warm and well perfused extremities. ABDOMEN: Soft, non-tender, non-distended. NEUROLOGIC: Patient is oriented x3 with no focal or lateralizing neurologic deficits.    DATA REVIEW  ECG: 04/2024: Anteroseptal infarct    ECHO: 04/2024: LVEF 25-30%, anterior wall motion 07/2024: 2 moderate size LV thrombi noted, LVEF 25-30%, anterior wall motion    CATH: 04/2024: Anterior STEMI with scuccessful PCI, moderate RCA disease, severe Lcx disease   ASSESSMENT & PLAN:  Chronic systolic heart failure: Ischemic, prior LAD STEMI. Minimal improvement on echo, BP limits aggressive titration of GDMT. - NYHA class II, euvolemic - - Continue spironolactone  12.5mg  daily, metoprolol  succinate 12.5mg  daily, losartan  12.5mg  daily - No diuretics - Cardiac rehab - Echo in 2 months, if no LV thrombus would interrupt anticoagulation for ICD placement and likely resume afterwards  CAD: Anterior STEMI.  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 - pradaxa  as above  Prostate cancer: - Continue xtandi    Follow up in 2 months  Morene Brownie, MD Advanced Heart Failure Mechanical Circulatory Support 07/27/2024

## 2024-07-23 NOTE — Patient Instructions (Signed)
°  Follow-Up in: 2 months with Dr. Zenaida with an Echocardiogram PLEASE CALL OUR OFFICE AROUND January  TO GET SCHEDULED FOR YOUR APPOINTMENT. PHONE NUMBER IS [phone] OPTION 2   At the [facility], you and your health needs are our priority. We have a designated team specialized in the treatment of Heart Failure. This Care Team includes your primary Heart Failure Specialized Cardiologist (physician), Advanced Practice Providers (APPs- Physician Assistants and Nurse Practitioners), and Pharmacist who all work together to provide you with the care you need, when you need it.   You may see any of the following providers on your designated Care Team at your next follow up:  Dr. Toribio Fuel Dr. Ezra Shuck Dr. Odis Zenaida Greig Mosses, NP Caffie Shed, [place] [facility] [place], [place] Beckey Coe, NP Jordan Lee, NP Tinnie Redman, PharmD   Please be sure to bring in all your medications bottles to every appointment.   Need to Contact Us :  If you have any questions or concerns before your next appointment please send us  a message through [place] or call our office at [phone].    TO LEAVE A MESSAGE FOR THE NURSE SELECT OPTION 2, PLEASE LEAVE A MESSAGE INCLUDING: YOUR NAME DATE OF BIRTH CALL BACK NUMBER REASON FOR CALL**this is important as we prioritize the call backs  YOU WILL RECEIVE A CALL BACK THE SAME DAY AS LONG AS YOU CALL BEFORE 4:00 PM

## 2024-07-25 ENCOUNTER — Encounter (HOSPITAL_COMMUNITY)
Admission: RE | Admit: 2024-07-25 | Discharge: 2024-07-25 | Disposition: A | Source: Ambulatory Visit | Attending: Cardiology

## 2024-07-25 DIAGNOSIS — I213 ST elevation (STEMI) myocardial infarction of unspecified site: Secondary | ICD-10-CM | POA: Diagnosis not present

## 2024-07-25 DIAGNOSIS — Z955 Presence of coronary angioplasty implant and graft: Secondary | ICD-10-CM

## 2024-07-25 DIAGNOSIS — I5022 Chronic systolic (congestive) heart failure: Secondary | ICD-10-CM | POA: Diagnosis not present

## 2024-07-28 ENCOUNTER — Encounter (HOSPITAL_COMMUNITY)
Admission: RE | Admit: 2024-07-28 | Discharge: 2024-07-28 | Disposition: A | Source: Ambulatory Visit | Attending: Cardiology

## 2024-07-28 DIAGNOSIS — I213 ST elevation (STEMI) myocardial infarction of unspecified site: Secondary | ICD-10-CM | POA: Diagnosis not present

## 2024-07-28 DIAGNOSIS — Z955 Presence of coronary angioplasty implant and graft: Secondary | ICD-10-CM

## 2024-07-28 DIAGNOSIS — I5022 Chronic systolic (congestive) heart failure: Secondary | ICD-10-CM | POA: Diagnosis not present

## 2024-07-30 ENCOUNTER — Encounter (HOSPITAL_COMMUNITY): Admission: RE | Admit: 2024-07-30 | Discharge: 2024-07-30 | Attending: Cardiology

## 2024-07-30 DIAGNOSIS — I213 ST elevation (STEMI) myocardial infarction of unspecified site: Secondary | ICD-10-CM

## 2024-07-30 DIAGNOSIS — Z955 Presence of coronary angioplasty implant and graft: Secondary | ICD-10-CM | POA: Diagnosis not present

## 2024-07-30 DIAGNOSIS — I5022 Chronic systolic (congestive) heart failure: Secondary | ICD-10-CM | POA: Diagnosis not present

## 2024-08-01 ENCOUNTER — Encounter (HOSPITAL_COMMUNITY)

## 2024-08-01 DIAGNOSIS — Z955 Presence of coronary angioplasty implant and graft: Secondary | ICD-10-CM | POA: Diagnosis not present

## 2024-08-01 DIAGNOSIS — I213 ST elevation (STEMI) myocardial infarction of unspecified site: Secondary | ICD-10-CM | POA: Diagnosis not present

## 2024-08-01 DIAGNOSIS — I5022 Chronic systolic (congestive) heart failure: Secondary | ICD-10-CM | POA: Diagnosis not present

## 2024-08-04 ENCOUNTER — Encounter (HOSPITAL_COMMUNITY): Admission: RE | Admit: 2024-08-04

## 2024-08-04 DIAGNOSIS — I213 ST elevation (STEMI) myocardial infarction of unspecified site: Secondary | ICD-10-CM | POA: Diagnosis not present

## 2024-08-04 DIAGNOSIS — I5022 Chronic systolic (congestive) heart failure: Secondary | ICD-10-CM | POA: Diagnosis not present

## 2024-08-04 DIAGNOSIS — Z955 Presence of coronary angioplasty implant and graft: Secondary | ICD-10-CM

## 2024-08-05 ENCOUNTER — Telehealth (HOSPITAL_COMMUNITY): Payer: Self-pay | Admitting: Cardiology

## 2024-08-05 NOTE — Telephone Encounter (Signed)
 He can have the note, I can sign it when back next week. Thanks

## 2024-08-05 NOTE — Telephone Encounter (Signed)
 Patient called to request letter to return to work  Reports this was discussed at recent OV however he still does not have a letter. Note must be signed by MD only  Return to work date 08/18/24  Pt will also drop off forms for [facility]

## 2024-08-06 ENCOUNTER — Encounter (HOSPITAL_COMMUNITY)
Admission: RE | Admit: 2024-08-06 | Discharge: 2024-08-06 | Disposition: A | Source: Ambulatory Visit | Attending: Cardiology

## 2024-08-06 VITALS — BP 118/78 | HR 75 | Ht 70.75 in | Wt 203.5 lb

## 2024-08-06 DIAGNOSIS — Z955 Presence of coronary angioplasty implant and graft: Secondary | ICD-10-CM | POA: Diagnosis not present

## 2024-08-06 DIAGNOSIS — I213 ST elevation (STEMI) myocardial infarction of unspecified site: Secondary | ICD-10-CM

## 2024-08-06 DIAGNOSIS — I5022 Chronic systolic (congestive) heart failure: Secondary | ICD-10-CM | POA: Diagnosis not present

## 2024-08-06 NOTE — Telephone Encounter (Signed)
 Letter written, printed, left for signature

## 2024-08-08 ENCOUNTER — Encounter (HOSPITAL_COMMUNITY)

## 2024-08-11 ENCOUNTER — Encounter (HOSPITAL_COMMUNITY): Admission: RE | Admit: 2024-08-11 | Source: Ambulatory Visit

## 2024-08-13 ENCOUNTER — Encounter (HOSPITAL_COMMUNITY)

## 2024-08-13 ENCOUNTER — Telehealth (HOSPITAL_COMMUNITY): Payer: Self-pay | Admitting: *Deleted

## 2024-08-13 NOTE — Progress Notes (Signed)
 Discharge Progress Report  Patient Details  Name: Andre Cowan MRN: [id number] Date of Birth: 1956-06-09 Referring Provider:   Flowsheet Row INTENSIVE CARDIAC REHAB ORIENT from 05/20/2024 in [facility]  Referring Provider Andre Heinz, MD     Number of Visits: 37  Reason for Discharge:  Patient reached a stable level of exercise. Patient independent in their exercise. Patient has met program and personal goals.  Smoking History:  Tobacco Use History[1]  Diagnosis:  05/02/24 STEMI  05/02/24 DES LAD  ADL UCSD:   Initial Exercise Prescription:  Initial Exercise Prescription - 05/20/24 1400       Date of Initial Exercise RX and Referring Provider   Date 05/20/24    Referring Provider Andre Heinz, MD    Expected Discharge Date 08/13/24      Recumbant Bike   Level 1    RPM 30    Watts 17    Minutes 15    METs 2.6      NuStep   Level 1    SPM 85    Minutes 15    METs 2.6      Prescription Details   Frequency (times per week) 3    Duration Progress to 30 minutes of continuous aerobic without signs/symptoms of physical distress      Intensity   THRR 40-80% of Max Heartrate 61-122    Ratings of Perceived Exertion 11-13    Perceived Dyspnea 0-4      Progression   Progression Continue to progress workloads to maintain intensity without signs/symptoms of physical distress.      Resistance Training   Training Prescription Yes    Weight 4 lbs    Reps 10-15          Discharge Exercise Prescription (Final Exercise Prescription Changes):  Exercise Prescription Changes - 08/06/24 1027       Response to Exercise   Blood Pressure (Admit) 118/78    Blood Pressure (Exit) 96/62    Heart Rate (Admit) 75 bpm    Heart Rate (Exercise) 120 bpm    Heart Rate (Exit) 88 bpm    Rating of Perceived Exertion (Exercise) 11    Symptoms None    Comments Andre Cowan completed the cardiac rehab program.    Duration  Continue with 30 min of aerobic exercise without signs/symptoms of physical distress.    Intensity THRR unchanged      Progression   Progression Continue to progress workloads to maintain intensity without signs/symptoms of physical distress.    Average METs 3.9      Resistance Training   Training Prescription No    Weight Relaxation day. No weights.      Interval Training   Interval Training No      Recumbant Bike   Level 3    RPM 92    Watts 53    Minutes 15    METs 3.7      NuStep   Level 4    SPM 115    Minutes 15    METs 4.1      Home Exercise Plan   Plans to continue exercise at Home (comment)   Walking   Frequency Add 3 additional days to program exercise sessions.    Initial Home Exercises Provided 06/11/24          Functional Capacity:  6 Minute Walk     Row Name 05/20/24 1230 08/04/24 1047  6 Minute Walk   Phase Initial Discharge    Distance 971 feet 1731 feet    Distance % Change -- 78.27 %    Distance Feet Change -- 760 ft    Walk Time 4.92 minutes 6 minutes    # of Rest Breaks 1  rested the last 1 minute 5 seconds 0    MPH 2.24 3.28    METS 2.57 3.9    RPE 9 11    Perceived Dyspnea  0 1    VO2 Peak 9 --    Symptoms Yes (comment) Yes (comment)    Comments Left hip pain, 8/10 on pain scale. Chronic with walking since his hospitalization. Mild shortness of breath.    Resting HR 92 bpm 83 bpm    Resting BP 100/64 108/62    Resting Oxygen Saturation  100 % --    Exercise Oxygen Saturation  during 6 min walk 100 % 100 %    Max Ex. HR 124 bpm 120 bpm    Max Ex. BP 120/76 132/56    2 Minute Post BP 100/64 128/64       Psychological, QOL, Others - Outcomes: PHQ 2/9:    07/30/2024    4:45 PM 05/20/2024   11:36 AM 11/15/2023    8:32 AM 11/15/2023    8:31 AM 05/16/2023    8:47 AM  Depression screen PHQ 2/9  Decreased Interest 0 0 0 0 0  Down, Depressed, Hopeless 0 0 0 0 0  PHQ - 2 Score 0 0 0 0 0  Altered sleeping 0 3 0  0  Tired,  decreased energy 0 2 0  0  Change in appetite 1 0 0  0  Feeling bad or failure about yourself  0 0 0  0  Trouble concentrating 0 0 0  0  Moving slowly or fidgety/restless 0 0 0  0  Suicidal thoughts 0 0 0  0  PHQ-9 Score 1 5  0   0   Difficult doing work/chores Not difficult at all Not difficult at all Not difficult at all  Not difficult at all     Data saved with a previous flowsheet row definition    Quality of Life:  Quality of Life - 07/30/24 1649       Quality of Life   Select Quality of Life      Quality of Life Scores   Health/Function Pre 16.6 %    Health/Function Post 25.71 %    Health/Function % Change 54.88 %    Socioeconomic Pre 26.79 %    Socioeconomic Post 26.33 %    Socioeconomic % Change  -1.72 %    Psych/Spiritual Pre 21.36 %    Psych/Spiritual Post 24.86 %    Psych/Spiritual % Change 16.39 %    Family Pre 22.6 %    Family Post 26.4 %    Family % Change 16.81 %    GLOBAL Pre 20.56 %    GLOBAL Post 25.75 %    GLOBAL % Change 25.24 %          Personal Goals: Goals established at orientation with interventions provided to work toward goal.  Personal Goals and Risk Factors at Admission - 05/20/24 1122       Core Components/Risk Factors/Patient Goals on Admission   Heart Failure Yes    Intervention Provide a combined exercise and nutrition program that is supplemented with education, support and counseling about heart failure. Directed toward relieving symptoms  such as shortness of breath, decreased exercise tolerance, and extremity edema.    Expected Outcomes Improve functional capacity of life;Short term: Attendance in program 2-3 days a week with increased exercise capacity. Reported lower sodium intake. Reported increased fruit and vegetable intake. Reports medication compliance.;Short term: Daily weights obtained and reported for increase. Utilizing diuretic protocols set by physician.;Long term: Adoption of self-care skills and reduction of barriers  for early signs and symptoms recognition and intervention leading to self-care maintenance.    Lipids Yes    Intervention Provide education and support for participant on nutrition & aerobic/resistive exercise along with prescribed medications to achieve LDL 70mg , HDL >40mg .    Expected Outcomes Short Term: Participant states understanding of desired cholesterol values and is compliant with medications prescribed. Participant is following exercise prescription and nutrition guidelines.;Long Term: Cholesterol controlled with medications as prescribed, with individualized exercise RX and with personalized nutrition plan. Value goals: LDL < 70mg , HDL > 40 mg.    Personal Goal Other Yes    Personal Goal Increase daily physical activity level. Improve results on all blood panels. Increase heart strength to acceptable levels to ensure self-sufficiency.    Intervention Develop and individualized exercise action plan that Chaze can do at home to help improve cardiac function and ability to remain self-sufficient. Develop a nutrition action plan that Zacchaeus can follow to help with lipid level and other pertinent blood panels.    Expected Outcomes Akshat will comply with and maintain exercise and nutrition action plan to help improve cardiac function and overall health.           Personal Goals Discharge:  Goals and Risk Factor Review     Row Name 05/29/24 0908 06/13/24 1441 07/15/24 1608         Core Components/Risk Factors/Patient Goals Review   Personal Goals Review Weight Management/Obesity;Heart Failure;Lipids;Stress Weight Management/Obesity;Heart Failure;Lipids;Stress Weight Management/Obesity;Heart Failure;Lipids;Stress     Review Quintavius started cardiac rehab on 05/26/24. Homero did well with exercise for his fitness level.Jamus started physical therapy for chronic hip pain. Krystal started cardiac rehab on 05/26/24. Wendle is off to a good with exercise. Manny has increased his met levels.  Resting systolic BP's have been in the 90's asymptomatic. Will forward to [facility] NP. Zacheriah is doing well with exercise. Lawernce has increased his met levels.  Vital signs have been stable.     Expected Outcomes Mehran will continue to participate in cardiac rehab for exercise, nutrition and lifestyle modifications Alee will continue to participate in cardiac rehab for exercise, nutrition and lifestyle modifications Phuong will continue to participate in cardiac rehab for exercise, nutrition and lifestyle modifications        Exercise Goals and Review:  Exercise Goals     Row Name 05/20/24 1122             Exercise Goals   Increase Physical Activity Yes       Intervention Provide advice, education, support and counseling about physical activity/exercise needs.;Develop an individualized exercise prescription for aerobic and resistive training based on initial evaluation findings, risk stratification, comorbidities and participant's personal goals.       Expected Outcomes Short Term: Attend rehab on a regular basis to increase amount of physical activity.;Long Term: Exercising regularly at least 3-5 days a week.;Long Term: Add in home exercise to make exercise part of routine and to increase amount of physical activity.       Increase Strength and Stamina Yes       Intervention  Provide advice, education, support and counseling about physical activity/exercise needs.;Develop an individualized exercise prescription for aerobic and resistive training based on initial evaluation findings, risk stratification, comorbidities and participant's personal goals.       Expected Outcomes Short Term: Increase workloads from initial exercise prescription for resistance, speed, and METs.;Short Term: Perform resistance training exercises routinely during rehab and add in resistance training at home;Long Term: Improve cardiorespiratory fitness, muscular endurance and strength as measured by increased METs  and functional capacity ( )       Able to understand and use rate of perceived exertion (RPE) scale Yes       Intervention Provide education and explanation on how to use RPE scale       Expected Outcomes Short Term: Able to use RPE daily in rehab to express subjective intensity level;Long Term:  Able to use RPE to guide intensity level when exercising independently       Knowledge and understanding of Target Heart Rate Range (THRR) Yes       Intervention Provide education and explanation of THRR including how the numbers were predicted and where they are located for reference       Expected Outcomes Short Term: Able to state/look up THRR;Long Term: Able to use THRR to govern intensity when exercising independently;Short Term: Able to use daily as guideline for intensity in rehab       Able to check pulse independently Yes       Intervention Provide education and demonstration on how to check pulse in carotid and radial arteries.;Review the importance of being able to check your own pulse for safety during independent exercise       Expected Outcomes Short Term: Able to explain why pulse checking is important during independent exercise;Long Term: Able to check pulse independently and accurately       Understanding of Exercise Prescription Yes       Intervention Provide education, explanation, and written materials on patient's individual exercise prescription       Expected Outcomes Short Term: Able to explain program exercise prescription;Long Term: Able to explain home exercise prescription to exercise independently          Exercise Goals Re-Evaluation:  Exercise Goals Re-Evaluation     Row Name 05/26/24 1134 06/11/24 0959 07/09/24 1134 07/23/24 1109 08/13/24 1136     Exercise Goal Re-Evaluation   Exercise Goals Review Increase Physical Activity;Increase Strength and Stamina;Able to understand and use rate of perceived exertion (RPE) scale Increase Physical Activity;Increase Strength and  Stamina;Able to understand and use rate of perceived exertion (RPE) scale;Understanding of Exercise Prescription;Knowledge and understanding of Target Heart Rate Range (THRR) Increase Physical Activity;Increase Strength and Stamina;Able to understand and use rate of perceived exertion (RPE) scale;Understanding of Exercise Prescription;Knowledge and understanding of Target Heart Rate Range (THRR) Increase Physical Activity;Increase Strength and Stamina;Able to understand and use rate of perceived exertion (RPE) scale;Understanding of Exercise Prescription;Knowledge and understanding of Target Heart Rate Range (THRR) Increase Physical Activity;Increase Strength and Stamina;Able to understand and use rate of perceived exertion (RPE) scale;Understanding of Exercise Prescription;Knowledge and understanding of Target Heart Rate Range (THRR)   Comments Aayush was able to understand and use RPE scale appropriately. Reviewed exercise prescription and MET level progression with Will. He is walking at home in addition to exercise at cardiac rehab. Hanz is exercising at the gym T, Th, and Sat. He's using the free weights (4 lbs) and weight machines (10lbs), 2-3 sets or 10-15 repetitions. He continues to progress well  with exercise. Cailen is making expected progress with exercise. He conitnues to exercise at the gym in addition to exercise at cardiac rehab. Vernonr completed the cardiac rehab progam and will continue exercise at the gym.   Expected Outcomes Progress workloads as tolerated to help improve cardiorespiratory fitness. Continue walking 3 days/week in addition to exercise at cardiac rehab. Continue to progress workloads as tolerated to help increase MET level. Continue exercise at the gym 3 days/week in addition to exercise at cardiac rehab. Continue exercise at the gym 3 days/week in addition to exercise at cardiac rehab. Edelmiro will continue exercise at the gym to maintain health and fitness gains.       Nutrition & Weight - Outcomes:  Pre Biometrics - 05/20/24 1021       Pre Biometrics   Waist Circumference 43.5 inches    Hip Circumference 43 inches    Waist to Hip Ratio 1.01 %    Triceps Skinfold 18 mm    % Body Fat 29.9 %    Grip Strength 35 kg    Flexibility 10.63 in    Single Leg Stand 30 seconds          Post Biometrics - 08/06/24 1038        Post  Biometrics   Height 5' 10.75 (1.797 m)    Waist Circumference 41.5 inches    Hip Circumference 41.5 inches    Waist to Hip Ratio 1 %    BMI (Calculated) 28.58    Triceps Skinfold 16 mm    % Body Fat 28.6 %    Grip Strength 31 kg    Flexibility 10.75 in    Single Leg Stand 26.12 seconds          Nutrition:   Nutrition Discharge:   Education Questionnaire Score:  Knowledge Questionnaire Score - 07/30/24 1646       Knowledge Questionnaire Score   Pre Score 24/24    Post Score 22/24          Goals reviewed with patient; copy given to patient.Pt graduates from  Intensive/Traditional cardiac rehab program on 08/06/24 with completion of  29 exercise and  8 education sessions. Pt maintained good attendance and progressed nicely during his participation in rehab as evidenced by increased MET level.  Loic increased his distance on his post exercise walk test by 760 feet.  Medication list reconciled. Repeat  PHQ score- 1 .  Pt has made significant lifestyle changes and should be commended for his success.  Brenyn achieved his goals during cardiac rehab.   Pt plans to continue exercise at  the gym. Medardo says that participating in cardiac rehab has been helpful for him.Hadassah Elpidio Quan RN BSN      [1]  Social History Tobacco Use  Smoking Status Some Days   Current packs/day: 0.00   Average packs/day: 0.5 packs/day for 40.0 years (20.0 ttl pk-yrs)   Types: Cigarettes   Start date: 09/15/1975   Last attempt to quit: 09/15/2015   Years since quitting: 8.9   Passive exposure: Current  Smokeless Tobacco  Never

## 2024-08-13 NOTE — Telephone Encounter (Signed)
 Andre Cowan has the flu and will not be returning to cardiac rehab as he returns to work on Monday. Will discharge from cardiac rehab at this time. Leray did well with exercise while in the program.Theodore Virgin Elpidio Quan RN BSN

## 2024-08-21 ENCOUNTER — Encounter (HOSPITAL_BASED_OUTPATIENT_CLINIC_OR_DEPARTMENT_OTHER): Payer: Self-pay | Admitting: Family Medicine

## 2024-08-21 ENCOUNTER — Ambulatory Visit (INDEPENDENT_AMBULATORY_CARE_PROVIDER_SITE_OTHER): Admitting: Family Medicine

## 2024-08-21 VITALS — BP 106/60 | HR 88 | Temp 98.2°F | Resp 18 | Ht 70.75 in | Wt 203.0 lb

## 2024-08-21 DIAGNOSIS — I5022 Chronic systolic (congestive) heart failure: Secondary | ICD-10-CM | POA: Diagnosis not present

## 2024-08-21 DIAGNOSIS — C61 Malignant neoplasm of prostate: Secondary | ICD-10-CM

## 2024-08-21 DIAGNOSIS — M25552 Pain in left hip: Secondary | ICD-10-CM | POA: Diagnosis not present

## 2024-08-21 DIAGNOSIS — I513 Intracardiac thrombosis, not elsewhere classified: Secondary | ICD-10-CM | POA: Diagnosis not present

## 2024-08-21 DIAGNOSIS — Z0000 Encounter for general adult medical examination without abnormal findings: Secondary | ICD-10-CM

## 2024-08-21 NOTE — Assessment & Plan Note (Signed)
-   NYHA class II, euvolemic - Continue with current meds - spironolactone  12.5mg  daily, metoprolol  succinate 12.5mg  daily, losartan  12.5mg  daily - continue with follow-up with HF clinic

## 2024-08-21 NOTE — Assessment & Plan Note (Signed)
 Noted on prior echocardiogram with cardiology.  He does have follow-up echo in about 2 months.  If thrombus is no longer present, then they will proceed with ICD placement. Recommend continuing with plan as per cardiology.

## 2024-08-21 NOTE — Assessment & Plan Note (Signed)
 Currently taking xtandi Continue with regular follow-up with urology for monitoring No new symptoms at this time

## 2024-08-21 NOTE — Assessment & Plan Note (Signed)
 Doing well currently, no issues with hip pain since last visit with me. Can proceed with monitoring, return to office should symptoms return

## 2024-08-21 NOTE — Progress Notes (Signed)
" ° ° °  Procedures performed today:    None.  Independent interpretation of notes and tests performed by another provider:   None.  Brief History, Exam, Impression, and Recommendations:    BP 106/60 (BP Location: Left Arm, Patient Position: Sitting, Cuff Size: Normal)   Pulse 88   Temp 98.2 F (36.8 C) (Oral)   Resp 18   Ht 5' 10.75 (1.797 m)   Wt 203 lb (92.1 kg)   SpO2 99%   BMI 28.51 kg/m   Left hip pain Assessment & Plan: Doing well currently, no issues with hip pain since last visit with me. Can proceed with monitoring, return to office should symptoms return   Chronic systolic heart failure (HCC) Assessment & Plan: - NYHA class II, euvolemic - Continue with current meds - spironolactone  12.5mg  daily, metoprolol  succinate 12.5mg  daily, losartan  12.5mg  daily - continue with follow-up with HF clinic   Wellness examination -     CBC with Differential/Platelet; Future -     Comprehensive metabolic panel with GFR; Future -     Hemoglobin A1c; Future -     Lipid panel; Future  Prostate cancer [facility]) Assessment & Plan: Currently taking xtandi Continue with regular follow-up with urology for monitoring No new symptoms at this time   LV (left ventricular) mural thrombus Assessment & Plan: Noted on prior echocardiogram with cardiology.  He does have follow-up echo in about 2 months.  If thrombus is no longer present, then they will proceed with ICD placement. Recommend continuing with plan as per cardiology.   Return in about 3 months (around 11/19/2024) for CPE which is scheduled in April.   ___________________________________________ Cidney Kirkwood de [place], MD, ABFM, CAQSM Primary Care and Sports Medicine [facility] "

## 2024-09-02 ENCOUNTER — Ambulatory Visit: Payer: Self-pay | Admitting: Cardiology

## 2024-11-11 ENCOUNTER — Ambulatory Visit (HOSPITAL_BASED_OUTPATIENT_CLINIC_OR_DEPARTMENT_OTHER)

## 2024-11-18 ENCOUNTER — Encounter (HOSPITAL_BASED_OUTPATIENT_CLINIC_OR_DEPARTMENT_OTHER): Admitting: Family Medicine
